# Patient Record
Sex: Male | Born: 1935 | Race: White | Hispanic: No | Smoking: Former smoker
Health system: Southern US, Community
[De-identification: ages and names within clinical notes are randomized; demographics above are authoritative.]

## PROBLEM LIST (undated history)

## (undated) DIAGNOSIS — E785 Hyperlipidemia, unspecified: Secondary | ICD-10-CM

## (undated) DIAGNOSIS — I639 Cerebral infarction, unspecified: Secondary | ICD-10-CM

## (undated) DIAGNOSIS — Z8739 Personal history of other diseases of the musculoskeletal system and connective tissue: Secondary | ICD-10-CM

## (undated) DIAGNOSIS — I10 Essential (primary) hypertension: Secondary | ICD-10-CM

## (undated) DIAGNOSIS — K219 Gastro-esophageal reflux disease without esophagitis: Secondary | ICD-10-CM

## (undated) DIAGNOSIS — I2510 Atherosclerotic heart disease of native coronary artery without angina pectoris: Secondary | ICD-10-CM

## (undated) DIAGNOSIS — N189 Chronic kidney disease, unspecified: Secondary | ICD-10-CM

## (undated) HISTORY — DX: Chronic kidney disease, unspecified: N18.9

## (undated) HISTORY — PX: CORONARY ARTERY BYPASS GRAFT: SHX141

## (undated) HISTORY — DX: Personal history of other diseases of the musculoskeletal system and connective tissue: Z87.39

## (undated) HISTORY — DX: Atherosclerotic heart disease of native coronary artery without angina pectoris: I25.10

## (undated) HISTORY — PX: HERNIA REPAIR: SHX51

## (undated) HISTORY — DX: Hyperlipidemia, unspecified: E78.5

## (undated) HISTORY — DX: Gastro-esophageal reflux disease without esophagitis: K21.9

## (undated) HISTORY — DX: Essential (primary) hypertension: I10

---

## 2006-11-05 ENCOUNTER — Inpatient Hospital Stay (HOSPITAL_BASED_OUTPATIENT_CLINIC_OR_DEPARTMENT_OTHER): Admission: RE | Admit: 2006-11-05 | Discharge: 2006-11-05 | Payer: Self-pay | Admitting: *Deleted

## 2006-11-10 ENCOUNTER — Encounter (INDEPENDENT_AMBULATORY_CARE_PROVIDER_SITE_OTHER): Payer: Self-pay | Admitting: *Deleted

## 2006-11-10 ENCOUNTER — Ambulatory Visit (HOSPITAL_COMMUNITY): Admission: RE | Admit: 2006-11-10 | Discharge: 2006-11-10 | Payer: Self-pay | Admitting: *Deleted

## 2006-12-11 ENCOUNTER — Encounter (INDEPENDENT_AMBULATORY_CARE_PROVIDER_SITE_OTHER): Payer: Self-pay | Admitting: *Deleted

## 2006-12-11 ENCOUNTER — Inpatient Hospital Stay (HOSPITAL_COMMUNITY): Admission: RE | Admit: 2006-12-11 | Discharge: 2006-12-16 | Payer: Self-pay | Admitting: Surgery

## 2006-12-26 ENCOUNTER — Emergency Department (HOSPITAL_COMMUNITY): Admission: EM | Admit: 2006-12-26 | Discharge: 2006-12-26 | Payer: Self-pay | Admitting: Emergency Medicine

## 2007-12-30 HISTORY — PX: CARDIAC VALVE REPLACEMENT: SHX585

## 2009-11-19 ENCOUNTER — Ambulatory Visit: Payer: Self-pay | Admitting: Diagnostic Radiology

## 2009-11-19 ENCOUNTER — Encounter: Payer: Self-pay | Admitting: Emergency Medicine

## 2009-11-19 ENCOUNTER — Inpatient Hospital Stay (HOSPITAL_COMMUNITY): Admission: EM | Admit: 2009-11-19 | Discharge: 2009-11-23 | Payer: Self-pay | Admitting: Internal Medicine

## 2011-01-19 ENCOUNTER — Encounter: Payer: Self-pay | Admitting: Surgery

## 2011-04-02 LAB — CBC
HCT: 25.4 % — ABNORMAL LOW (ref 39.0–52.0)
HCT: 27.6 % — ABNORMAL LOW (ref 39.0–52.0)
Hemoglobin: 9.3 g/dL — ABNORMAL LOW (ref 13.0–17.0)
MCHC: 34.8 g/dL (ref 30.0–36.0)
MCHC: 33.7 g/dL (ref 30.0–36.0)
MCHC: 34.5 g/dL (ref 30.0–36.0)
MCV: 97.7 fL (ref 78.0–100.0)
MCV: 97.6 fL (ref 78.0–100.0)
Platelets: 187 10*3/uL (ref 150–400)
Platelets: 207 10*3/uL (ref 150–400)
Platelets: 330 10*3/uL (ref 150–400)
RBC: 3.01 MIL/uL — ABNORMAL LOW (ref 4.22–5.81)
RBC: 3.75 MIL/uL — ABNORMAL LOW (ref 4.22–5.81)
RDW: 13.5 % (ref 11.5–15.5)
RDW: 13.9 % (ref 11.5–15.5)
RDW: 14.3 % (ref 11.5–15.5)
RDW: 14.9 % (ref 11.5–15.5)
RDW: 13.1 % (ref 11.5–15.5)
WBC: 19.3 10*3/uL — ABNORMAL HIGH (ref 4.0–10.5)
WBC: 26 10*3/uL — ABNORMAL HIGH (ref 4.0–10.5)

## 2011-04-02 LAB — BASIC METABOLIC PANEL
CO2: 25 mEq/L (ref 19–32)
CO2: 23 mEq/L (ref 19–32)
Calcium: 7.5 mg/dL — ABNORMAL LOW (ref 8.4–10.5)
Chloride: 107 mEq/L (ref 96–112)
GFR calc Af Amer: 57 mL/min — ABNORMAL LOW (ref >60)
Glucose, Bld: 115 mg/dL — ABNORMAL HIGH (ref 70–99)
Glucose, Bld: 117 mg/dL — ABNORMAL HIGH (ref 70–99)
Potassium: 3.5 mEq/L (ref 3.5–5.1)
Sodium: 141 mEq/L (ref 135–145)
Sodium: 141 mEq/L (ref 135–145)

## 2011-04-02 LAB — COMPREHENSIVE METABOLIC PANEL
ALT: 18 U/L (ref 0–53)
ALT: 22 U/L (ref 0–53)
AST: 18 U/L (ref 0–37)
AST: 44 U/L — ABNORMAL HIGH (ref 0–37)
Albumin: 2.1 g/dL — ABNORMAL LOW (ref 3.5–5.2)
Albumin: 2.4 g/dL — ABNORMAL LOW (ref 3.5–5.2)
Albumin: 4.3 g/dL (ref 3.5–5.2)
Alkaline Phosphatase: 37 U/L — ABNORMAL LOW (ref 39–117)
BUN: 25 mg/dL — ABNORMAL HIGH (ref 6–23)
CO2: 24 mEq/L (ref 19–32)
Calcium: 9.5 mg/dL (ref 8.4–10.5)
Chloride: 104 mEq/L (ref 96–112)
Creatinine, Ser: 1.97 mg/dL — ABNORMAL HIGH (ref 0.4–1.5)
Creatinine, Ser: 1.99 mg/dL — ABNORMAL HIGH (ref 0.4–1.5)
GFR calc Af Amer: 39 mL/min — ABNORMAL LOW (ref >60)
GFR calc Af Amer: 47 mL/min — ABNORMAL LOW (ref >60)
GFR calc non Af Amer: 32 mL/min — ABNORMAL LOW (ref >60)
Glucose, Bld: 114 mg/dL — ABNORMAL HIGH (ref 70–99)
Sodium: 137 mEq/L (ref 135–145)
Sodium: 147 mEq/L — ABNORMAL HIGH (ref 135–145)
Total Bilirubin: 0.5 mg/dL (ref 0.3–1.2)
Total Bilirubin: 0.7 mg/dL (ref 0.3–1.2)
Total Protein: 5.1 g/dL — ABNORMAL LOW (ref 6.0–8.3)
Total Protein: 7.5 g/dL (ref 6.0–8.3)

## 2011-04-02 LAB — IRON AND TIBC
Saturation Ratios: 10 % — ABNORMAL LOW (ref 20–55)
TIBC: 118 ug/dL — ABNORMAL LOW (ref 215–435)

## 2011-04-02 LAB — CEA: CEA: 1.9 ng/mL (ref 0.0–5.0)

## 2011-04-02 LAB — URINALYSIS, ROUTINE W REFLEX MICROSCOPIC
Bilirubin Urine: NEGATIVE
Glucose, UA: NEGATIVE mg/dL
Ketones, ur: NEGATIVE mg/dL
Specific Gravity, Urine: 1.014 (ref 1.005–1.030)
pH: 5.5 (ref 5.0–8.0)

## 2011-04-02 LAB — DIFFERENTIAL
Basophils Relative: 0 % (ref 0–1)
Eosinophils Relative: 1 % (ref 0–5)
Lymphs Abs: 2.1 10*3/uL (ref 0.7–4.0)
Monocytes Absolute: 1.9 10*3/uL — ABNORMAL HIGH (ref 0.1–1.0)
Monocytes Relative: 7 % (ref 3–12)

## 2011-04-02 LAB — LIPID PANEL: Total CHOL/HDL Ratio: 1.5 RATIO

## 2011-04-02 LAB — CULTURE, BLOOD (ROUTINE X 2): Culture: NO GROWTH

## 2011-04-02 LAB — MAGNESIUM: Magnesium: 1.6 mg/dL (ref 1.5–2.5)

## 2011-04-02 LAB — PHOSPHORUS: Phosphorus: 1.7 mg/dL — ABNORMAL LOW (ref 2.3–4.6)

## 2011-04-02 LAB — SODIUM, URINE, RANDOM: Sodium, Ur: 15 mEq/L

## 2011-04-02 LAB — CREATININE, URINE, RANDOM: Creatinine, Urine: 148.1 mg/dL

## 2011-04-02 LAB — PROTIME-INR: INR: 1.49 (ref 0.00–1.49)

## 2011-05-16 NOTE — H&P (Signed)
Adam Weeks, Adam Weeks                   ACCOUNT NO.:  [id number]   MEDICAL RECORD NO.:  [id number]           PATIENT TYPE:   LOCATION:                                 FACILITY:   PHYSICIAN:  Elmore Guise., M.D.DATE OF BIRTH:  1927/06/15   DATE OF ADMISSION:  DATE OF DISCHARGE:                                HISTORY & PHYSICAL   INDICATION FOR ADMISSION AND CATHETERIZATION:  Severe aortic stenosis, now  symptomatic.   HISTORY OF PRESENT ILLNESS:  The patient is very pleasant 77 year old white  male.   PAST MEDICAL HISTORY:  Hypertension, dyslipidemia, degenerative joint  disease, lumbar spinal stenosis, aortic valve disease who presents for a new  patient evaluation.  The patient has been in the [place] system for some time;  and he has had serial echocardiograms done there which have shown  progressive aortic stenosis.  His last echo was done April 16, 2006 which  showed severe aortic stenosis with aortic valve area of 0.9 centimeter  squared.  He had a peak and mean gradients of 84 and 49 mmHg.  He also had  moderate pulmonary hypertension with peak PA systolic pressure of 49 mmHg.  He had trace-to-mild aortic insufficiency.   At that time a long conversation was had with the [facility] and he was set  up for cardiac catheterization.  Unfortunately he has not had a heart  catheterization, as of this time (because of mild renal insufficiency).  During the summer he states that he could do all of his activities without  any significant limitations.  He did start having problems with lower back  pain; and was found to have degenerative joint disease with spinal stenosis.  The degree of spinal stenosis is unknown at time of dictation.  He did have  workup including MRI which showed a foraminal narrowing; and he was set up  to see a neurosurgeon for further evaluation.  However this was also placed  on hold until the patient had his heart workup completed.  He states when he  takes his  pain medicines he can ambulate; however, over the last month or  two he has been having significant dyspnea.  He now has trouble with  shortness of breath on climbing stairs, denies any orthopnea, no PND; does  have an occasional lower extremity edema right greater than left.  No  significant angina or chest pain.  He did have three presyncopal episodes in  the last 3-4 months.  His first episode happened when he got up too  quickly.  However, his other spells happened while he was up and  ambulatory.  He felt weak and faint.  He denied any palpitations  associated with this.  His weight has been stable.  He has had no recent  fever, chills, nausea, vomiting, nor diarrhea.   REVIEW OF SYSTEMS:  As per HPI or otherwise negative.   CURRENT MEDICATIONS:  1. Alendronate 70 mg once a week.  2. Amlodipine 10 mg daily.  3. Atenolol 50 mg daily.  4. Calcium with vitamin D twice daily.  5. Flexeril 10 mg t.i.d.  6. Vicodin p.r.n.  7. Finasteride 5 mg q.h.s.  8. Folic acid 1 mg daily.  9. Lasix 40 mg daily.  10.Lactulose p.r.n.  11.Loratadine 10 mg daily.  12.Multivitamin once daily.  13  Omeprazole 20 mg daily.  1. Oxycodone 5 mg p.r.n.  2. Paxil 20 mg daily.  3. Fish oil tablets 2 twice daily.  4. Zocor 40 mg daily.   ALLERGIES:  None.   FAMILY HISTORY:  Positive for heart disease in his father, starting in his  early-to-mid 41s; he died from a heart attack at age 16.  His mother died  from breast cancer.   SOCIAL HISTORY:  He is divorced.  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  He walked until late spring early summer; and has had  decreased exertional capacity secondary to shortness of breath and back  issues since that time.  He smoked for 20 pack years, quit 40 years ago.  No  alcohol.  Does drink one caffeinated beverage daily.  Past surgical history  includes 3 hernia repairs in the past, tonsillectomy, and a kidney stone  removal.   PHYSICAL EXAMINATION:   VITAL SIGNS:  His weight is 182 pounds.  His blood  pressure 120/78, heart rate 63 and regular.  GENERAL:  He is a very pleasant elderly white male alert and oriented x4, no  acute distress.  NECK:  He has no JVD.  He has bilateral radiation of his AS murmur to both  carotids, right greater than left.  LUNGS:  Clear.  HEART:  Regular with a normal S1-S2 with 3/6 late peaking systolic ejection  murmur noted.  ABDOMEN:  Soft, nontender, nondistended.  No rebound or guarding.  No  abdominal bruits.  EXTREMITIES:  Warm with 2+ pulses and no significant edema.  His femoral  pulse is 2+.   His echo results were reviewed; and he has had progression of his aortic  stenosis with his echo from [place] showing aortic valve area of 1.1  centimeter squared; an echo 1 year later showed an aortic valve area of 0.9  centimeters squared.  His peak and mean gradients have also increased from  72 and 41 to 84 and 49.  His most recent blood work done September 01, 2006  showed a BUN and creatinine of 29 and 1.6, normal electrolytes.  Estimated  GFR of 45 mL per minute.  He has had normal LFTs.  His total cholesterol of  129, HDL of 32, triglycerides of 166 and the LDL of 64.  His EKG, today,  shows normal sinus rhythm rate of 63 per minute normal axis with right  bundle branch block.  No significant ST or T wave changes.  His chest x-ray,  today, shows no acute cardiopulmonary disease with calcification noted at  his aortic root.   IMPRESSION:  1. Severe symptomatic aortic stenosis.  2. History of hypertension.  3. History of chronic renal insufficiency.  4. History of dyslipidemia.  5. History of spinal stenosis.   PLAN:  1. I did discuss his symptoms with him at length.  Since he has      progression of his symptoms as well as progression of his disease by      echocardiogram, I do recommend cardiac catheterization to evaluate for     any significant coronary disease as well as a full evaluation  of his      aortic valve.  I did discuss  should we have difficulty and/or are able      to cross the aortic valve, I would then recommend a TEE for further      evaluation and planimetry.  However, if I can get across the valve I      would hold off on a TEE at this time.  Because of his mild      chronic renal insufficiency, He will be set up with [facility] the day of      and day prior to his procedure.  I have also asked him to stop his      Lasix, the day prior to his procedure.  He will continue his medicines      as listed, otherwise add an aspirin once daily.  All of his questions      were answered.      Elmore Guise., M.D.  Electronically Signed     TWK/MEDQ  D:  11/03/2006  T:  11/04/2006  Job:  [id number]

## 2011-05-16 NOTE — Cardiovascular Report (Signed)
NAMEALIOU, MEALEY                   ACCOUNT NO.:  [id number]   MEDICAL RECORD NO.:  [id number]          PATIENT TYPE:  OIB   LOCATION:  6501                         FACILITY:  [facility]   PHYSICIAN:  Elmore Guise., M.D.DATE OF BIRTH:  Mar 04, 1927   DATE OF PROCEDURE:  11/05/2006  DATE OF DISCHARGE:                              CARDIAC CATHETERIZATION   INDICATION FOR PROCEDURE:  Symptomatic severe aortic stenosis   PROCEDURE DESCRIPTION:  The patient was brought to the cardiac cath lab  after appropriate informed consent.  He was prepped and draped in sterile  fashion.  Approximately 20 cc of 1% lidocaine was used for local anesthesia.  A 5-French sheath was placed in the right femoral artery, and a 7-French  sheath was placed in the right femoral vein without difficulty.  Right heart  catheterization was then performed, followed by several attempts to cross  the aortic valve which were unsuccessful.  We then continued with coronary  angiography.  The patient tolerated the procedure well, with no apparent  complications.   FINDINGS:  Right heart cath numbers:  1. RA:  6 mmHg.  2. RV:  23/6 mmHg.  3. PA:  27/11 mmHg.  4. PCWP:  12 mmHg.  5. AO:  111/50 mmHg.  6. CO: 4.6 L/min by thermodilution   AORTIC VALVE:  Moderate calcification. Multiple attempts were made to cross  the aortic valve which were unsuccessful.   CORONARIES:  1. Left main:  Normal.  2. LAD:  Mild luminal irregularities.  3. D1/D2:  Small to moderate-sized vessels, with mild luminal      irregularities.  4. Ramus intermedius:  Moderate size, with mild luminal irregularities.  5. LCX:  Is nondominant, with mild luminal irregularities.  6. OM1/OM2:  Mild luminal irregularities.  7. RCA:  Dominant, with proximal 50-60% stenosis, with diffuse mid- and      distal luminal irregularities.  8. PDA/PLV:  No obstructive disease.   IMPRESSION:  1. Moderate proximal RCA disease,with 50-60% proximal stenosis.  2.  Nonobstructive left system as described.  3. History of severe symptomatic aortic stenosis.  4. Normal right heart pressures.   PLAN:  The patient will continue his current medical therapy.  We will  schedule him for a TEE for better evaluation of his aortic valve disease.  He should continue with SBE prophylaxis with any dental, GI, or GU  procedure. Surgical referral after complete evaluation.      Elmore Guise., M.D.  Electronically Signed     TWK/MEDQ  D:  11/05/2006  T:  11/06/2006  Job:  Elmo Putt

## 2011-05-16 NOTE — Discharge Summary (Signed)
NAMEJABAR, KRYSIAK                   ACCOUNT NO.:  [id number]   MEDICAL RECORD NO.:  [id number]          PATIENT TYPE:  INP   LOCATION:  2016                         FACILITY:  [facility]   PHYSICIAN:  Evelene Croon, M.D.     DATE OF BIRTH:  28-Dec-1927   DATE OF ADMISSION:  12/11/2006  DATE OF DISCHARGE:                               DISCHARGE SUMMARY   PRIMARY DIAGNOSES:  1. Severe aortic stenosis.  2. Single-vessel coronary artery disease.   IN-HOSPITAL DIAGNOSES:  1. Postoperative atrial fibrillation.  2. Volume overload.  3. Acute blood loss anemia, postoperatively.   SECONDARY DIAGNOSES:  1. History of lower back degenerative joint disease.  2. Status post kidney stone surgery in 1967, with __________  .  3. Status post three abdominal wall hernias repaired.  4. Status post tonsillectomy.  5. Status post cataract surgery.  6. Hypertension.   IN-HOSPITAL OPERATIONS AND PROCEDURES:  1. Coronary artery bypass grafting x1 using a saphenous vein graft to      the posterior descending branch of the right coronary artery.  2. Aortic valve replacement using a 25-mm St. Jude Biocor porcine      valve.  3. Endoscopic vein harvesting from the left leg.   The patient's history and physical and hospital course:  The patient is  a 77 year old, relatively healthy, gentleman who was fairly disabled  secondary to degenerative joint disease of his lower spine with spinal  stenosis causing pain in both legs.  He has been followed at the [facility].  He has a history of aortic stenosis, has been followed by  the [facility], and was told that he should have surgery for this.  The  patient desired to follow up at [facility].  He was seen by Dr. Reyes Ivan  and underwent catheterization on November 05, 2006, which showed single-  vessel disease with 60% proximal right coronary stenosis.  He had severe  aortic stenosis __________  across the aortic valve.  The aortic valve  area was 0.8-cm2 by  plane imagery and 0.59-cm2 by __________  .  The  peak transaortic valve gradient was 51 and the mean gradient 27.7.  __________  calcified.  PA pressures was 27 __________  wedge pressure  12 and right atrial pressure of 6.  There is mild to moderate tricuspid  regurgitation, mild mitral regurgitation noted at TEE.   Following test results,  the patient was evaluated by Dr. Laneta Simmers.  Dr.  Laneta Simmers discussed with the patient undergoing coronary artery bypass  grafting as well as aortic valve replacement.  He discussed risks and  benefits of the procedures with the patient.  The patient acknowledges  understanding and agreed to proceed.  Surgery was scheduled for December 11, 2006.  For details of the patient's past medical history and  physical exam, please see dictated history and physical.   The patient was taken to the operating room on December 11, 2006, where  he underwent coronary artery bypass grafting x1, using a saphenous vein  graft to the posterior descending branch of the right  coronary artery.  The patient also had aortic valve replacement using a 25-mm St. Jude  Biocor porcine valve.  Endoscopic vein harvesting was done from the left  leg.  The patient tolerated the procedures well and was transferred to  the intensive care unit in stable condition.  Following surgery, the patient was seen to be hemodynamically stable.  The patient was extubated the evening of surgery.  Following extubation,  he was noted to be alert and oriented x4.  Neuro intact.  Post-op #1,  the patient's vital signs were stable.  Postoperative chest x-ray  stable.  Chest tubes and lines were discontinued.  The patient was noted  to have low blood count 9.3 and 27 and this was monitored.  The patient  was noted to be in a normal sinus rhythm post-op day #1.  On post-op day #3, the patient was noted to have gone into atrial  fibrillation.  He was started on IV amiodarone and did convert back to a  normal  sinus rhythm.  The patient was continued on amiodarone and  atenolol.  He remained in a normal sinus rhythm.  Due to the patient  undergoing a Biocor porcine valve, no Coumadin was needed.  With the  patient remaining in a normal sinus rhythm, no Coumadin was needed.  The  patient was transferred out to 2000 on post-op day #3.  His hemoglobin/hematocrit did drop slightly to 8.3 and 24 but he was  asymptomatic.  This was again monitored closely.  The patient was  ambulating well postoperatively.  His incisions were clean, dry and  intact and healing well.  He was able to be weaned off oxygen sating  greater than 90% on room air.  The patient was tolerating a regular diet  well with no nausea or vomiting noted.  He did have a slightly elevated  creatinine postoperatively, but his baseline was 1.8.  His postoperative  creatinine was noted to be 1.6.  Last lab obtained on post-op day #4, showed a white count of 7.7,  hemoglobin of 8.5, hematocrit 24.8, platelet count 126.  Sodium of 141,  potassium 4, chloride of 109, bicarb of 27, BUN of 25, creatinine of  1.6, glucose of 119.  __________  the patient was noted to be in a normal sinus rhythm.  His  incisions were clean, dry and intact and healing well.  The patient  considered ready for discharge home in the a.m. on post-op day #5,  December 16, 2006.   FOLLOWUP APPOINTMENTS:  1. A followup appointment has been arranged with Dr. Laneta Simmers for      January 05, 2007 at 1:45 p.m.  2. The patient will need to follow up with Dr. Kerby Nora in 2 weeks.  The      patient will need to contact Dr. Davy Pique office to schedule      followup appointment with him.   ACTIVITY:  1. The patient was instructed no driving till released to do so.  2. No lifting over 10 pounds.  3. Told to ambulate 3-4 times per day, progress as tolerated.  4. Continue breathing exercises.  INCISIONAL CARE:  The patient is told to shower, washing his incisions  using soap and  water.   He is to contact the office if he develops any drainage or opening from  any of his incision sites.   DIET:  The patient was educated on diet to be low-fat, low-salt.   DISCHARGE MEDICATIONS:  1. Multivitamin daily.  2. Calcium with D daily.  3. Flexeril 10 mg at night p.r.n..  4. Aspirin 325 mg daily.  5. Atenolol 25 mg daily.  6. __________  70 mg on Saturday.  7. Lipitor 20 mg at night.  8. Claritin 10 mg p.r.n.  9. Omeprazole 20 mg daily.  10.Proscar 5 mg daily.  11.Paxil  20 mg daily.  12.Lasix 40 mg daily.  13.Potassium chloride 20 mEq daily.  14.Amiodarone 200 mg b.i.d.  15.Folic acid 1 mg daily.  16.Oxycodone 5 mg one to two tabs every four to six hours p.r.n. pain.      Theda Belfast, [place]      Evelene Croon, M.D.  Electronically Signed    KMD/MEDQ  D:  12/15/2006  T:  12/15/2006  Job:  [id number]   cc:   Kerby Nora, Dr.

## 2011-05-16 NOTE — Op Note (Signed)
Adam Weeks, Adam Weeks                   ACCOUNT NO.:  [id number]   MEDICAL RECORD NO.:  [id number]          PATIENT TYPE:  INP   LOCATION:  2309                         FACILITY:  [facility]   PHYSICIAN:  Evelene Croon, M.D.     DATE OF BIRTH:  11-26-1927   DATE OF PROCEDURE:  12/11/2006  DATE OF DISCHARGE:                               OPERATIVE REPORT   PREOPERATIVE DIAGNOSES:  Severe aortic stenosis, and single-vessel  coronary artery disease.   POSTOPERATIVE DIAGNOSES:  Severe aortic stenosis, and single-vessel  coronary artery disease.   OPERATIVE PROCEDURES:  Median sternotomy, extracorporeal circulation,  coronary artery bypass surgery times one using a saphenous vein graft to  the posterior descending branch of the right coronary artery, aortic  valve replacement using a 25-mm St. Jude Biocor porcine valve,  endoscopic vein harvesting from the left leg.   ATTENDING SURGEON:  Evelene Croon, MD.   ASSISTANTS:  1. Sheliah Plane, MD.  2. Rowe Clack, P.A.-C.   ANESTHESIA:  General endotracheal.   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  He has  been followed at the [facility] and has been told that he will  require subsequently for that.  He has a history of aortic stenosis that  has been followed by the [facility], and he was told that he should have  surgery for that.  He desired to be followed up in [place] and have  surgery performed here.  He was seen by Dr. Reyes Ivan and underwent  catheterization on 11/05/2006, which showed single-vessel coronary  disease with a 60% proximal right coronary stenosis.  He had severe  aortic stenosis and it was not possible to cross his aortic valve.  The  aortic valve area was 0.8 cm sq by plain imagery, and 0.59 cm sq by V  max.  The peak transaortic valve gradient  was 51, and the mean gradient  27.7.  The valve was markedly calcified.  PA pressure was 27/11, with a  wedge pressure of 12 and a right atrial pressure of 6.  There was mild-  to-moderate tricuspid regurgitation and mild mitral regurgitation noted  on TEE.  After review of the angiogram and echocardiogram and  examination of the patient, it was felt that aortic valve replacement  and coronary artery bypass to the right coronary artery were the best  treatments to prevent further complications of aortic stenosis and to  prevent left ventricular deterioration.  I discussed the operative  procedure of aortic valve replacement and coronary artery bypass to the  right coronary artery with the patient and his son.  We discussed  alternatives, benefits and risks, including bleeding, blood transfusion,  infection, stroke, myocardial infarction, graft failure, and death.  We  also discussed the aortic valve replacement prostheses, and I  recommended a tissue valve, given his age of 77.  He was in agreement  with that.   DESCRIPTION OF OPERATIVE  PROCEDURES:  The patient was taken to the  operating room and placed on the table in supine position.  After  induction of general endotracheal anesthesia, a Foley catheter was  placed in the bladder using sterile technique.  Preoperative intravenous  antibiotics were given.  Then, the chest, abdomen and both lower  extremities were prepped and draped in the usual sterile manner.  A  transesophageal echocardiogram was performed and showed severe calcific  aortic stenosis.  There was so much calcium in the valve, it was not  possible to see the opening.  Left ventricular function appeared well  preserved.  Right ventricular function was well preserved.  There was  trivial mitral regurgitation.   Then, the chest was opened through a median sternotomy incision and the  pericardium opened in the midline.  Examination of the heart showed good  ventricular  contractility.  The ascending aorta was fairly long.  There  was calcified plaque present posteriorly, but the anterior wall of the  aorta appeared soft.  This posterior plaque extended up to about 2 cm  proximal to the innominate artery takeoff.  The aorta in this area was  soft.   Then, the saphenous vein was harvested.  We initially examined the  saphenous vein adjacent to the right knee, where it was a tiny vessel.  Therefore, we examined the saphenous vein adjacent to the left knee, and  this vein was medium-size and good quality.  Then, this vein was  harvested using endoscopic vein harvest technique from the left thigh.   Then, the patient was heparinized, and when an adequate activated  clotting time was achieved, the distal ascending aorta was cannulated  using a 20-French aortic cannula for arterial inflow.  Venous outflow  was achieved using a 2-stage venous cannula from the right atrial  appendage.  An antegrade cardioplegia cannula was inserted in the aortic  root.  A left ventricular vent was placed in the right superior  pulmonary vein.  I attempted to place a retrograde cardioplegia cannula,  but could not get it to engage the coronary sinus.  Therefore, we  decided to only give antegrade cardioplegia directly into the coronary  ostia once the aorta was opened.   Then, the patient was placed on cardiopulmonary bypass and the distal  coronary was identified.  The right coronary artery was diffusely  diseased with calcific plaque.  There was a moderate-size posterior  descending and posterolateral branch.  The posterior descending branch  appeared free of disease and was chosen for grafting on the right side.  The LAD and the circumflex branches has minimal distal plaque present.   Then, the aorta was crossclamped and 1000 cc of cold-blood antegrade  cardioplegia was administered in the aortic root with quick arrest of the heart.  Systemic hypothermia to 28 degrees  Centigrade and topical  hypothermic ice saline were used.  A temperature probe was placed in the  septum, and an insulated pad on the pericardium.   The first distal anastomosis was performed to the posterior descending  coronary artery.  The internal mammary was 1.6 mm.  The conduit used was  a segment of greater saphenous vein, and the anastomosis performed in an  end-to-side manner using continuous 7-0 Prolene suture.  Flow noted to  the graft was excellent.  Then, another dose of cardioplegia was given.   Then, attention was turned to aortic valve replacement.  The aorta was  opened transversely at the level of the sinotubular  junction.  Examination of the native valve showed that there were 3 leaflets that  were very heavily calcified and immobile.  The right and left coronary  ostia were identified and were sitting a good distance away from the  annulus.  There was no disease around the coronary ostia.  Then, the  native valve was excised.  The annulus was decalcified with rongeurs.  Care was taken to remove all particulate debris.  The left ventricle and  aortic root were irrigated with ice saline solution.  Then, the annulus  was sized and a 25-mm St. Jude Biocor porcine valve was chosen.  This  was model #B10-25A-00, serial [id number].  Then, a series of pledgeted 2-  0 Ethibond horizontal mattress sutures was placed around the aortic  annulus with the pledgets in a subannular position.  The sutures were  placed through the sewing ring and the valve lowered into place.  The  sutures were tied sequentially.  The valve seated nicely.  The coronary  ostia were not obstructed.  The patient was then rewarmed to 37 degrees  Centigrade.  The aorta was closed in two layers using continuous 4-0  Prolene suture.  Then, the single proximal vein graft anastomosis was  formed to the aortic root in end-to-side manner using continuous 6-0  Prolene suture.  Then, the left side of the heart was  de-aired.  The  head was placed in the Trendelenburg position and the crossclamp removed  at a time of 89 minutes.  There was spontaneous return of ventricular  fibrillation, and the patient was defibrillated and in sinus rhythm.   The proximal and distal anastomoses appeared hemostatic and the lie of  the graft was satisfactory.  A graft marker was placed around the  proximal anastomosis.  Two temporary right ventricular and right atrial  pacing wires were placed and brought out through the skin.   When the patient had rewarmed to 37 degrees Centigrade, he was weaned  from cardiopulmonary bypass on renal-dose dopamine.  Total bypass time  was 114 minutes.  Cardiac function appeared excellent, with a cardiac  output of 6 L per minute.  Transesophageal echocardiogram was performed  and showed a normal functioning aortic valve prosthesis.  There was no  evidence of perivalvular leak or regurgitation.  There was no mitral regurgitation.  Left ventricular function appeared well preserved.  Protamine was then given, and the venous and aortic cannulae were  removed without difficulty.  Hemostasis was achieved.  Two chest tubes  were placed with a tube in the posterior pericardium and 1 in the  anterior mediastinum.  The pericardium was loosely reapproximated over  the heart.  The sternum was closed with #6 stainless steel wires.  The  fascia was closed with continuous #1 Vicryl suture.  The subcutaneous  tissue was closed with continuous 2-0 Vicryl, and the skin with a 3-0  Vicryl subcuticular closure.  The lower extremity vein harvest sites  were closed in layers in a similar manner.  The sponge, needle and  instrument counts were correct as reported by the scrub nurse.  Dry  sterile dressings were applied over the incisions, around the chest  tubes, which were hooked to Pleur-evac suction.  The patient remained  hemodynamically stable and was transported to the SICU in guarded, but  stable  condition.      Evelene Croon, M.D.  Electronically Signed     BB/MEDQ  D:  12/11/2006  T:  12/12/2006  Job:  [id number]  cc:   Sheliah Plane, MD  Elmore Guise., M.D.  Cardiac Cath Lab

## 2011-08-15 ENCOUNTER — Ambulatory Visit: Payer: Self-pay | Admitting: Internal Medicine

## 2011-09-21 ENCOUNTER — Encounter: Payer: Self-pay | Admitting: Internal Medicine

## 2011-09-21 DIAGNOSIS — Z0000 Encounter for general adult medical examination without abnormal findings: Secondary | ICD-10-CM | POA: Insufficient documentation

## 2011-09-23 ENCOUNTER — Encounter: Payer: Self-pay | Admitting: Internal Medicine

## 2011-09-23 ENCOUNTER — Ambulatory Visit (INDEPENDENT_AMBULATORY_CARE_PROVIDER_SITE_OTHER): Payer: Medicare Other | Admitting: Internal Medicine

## 2011-09-23 ENCOUNTER — Ambulatory Visit: Payer: Medicare Other | Admitting: Internal Medicine

## 2011-09-23 DIAGNOSIS — M8580 Other specified disorders of bone density and structure, unspecified site: Secondary | ICD-10-CM | POA: Insufficient documentation

## 2011-09-23 DIAGNOSIS — M949 Disorder of cartilage, unspecified: Secondary | ICD-10-CM

## 2011-09-23 DIAGNOSIS — M899 Disorder of bone, unspecified: Secondary | ICD-10-CM

## 2011-09-23 DIAGNOSIS — Z954 Presence of other heart-valve replacement: Secondary | ICD-10-CM

## 2011-09-23 DIAGNOSIS — M545 Low back pain, unspecified: Secondary | ICD-10-CM | POA: Insufficient documentation

## 2011-09-23 DIAGNOSIS — E785 Hyperlipidemia, unspecified: Secondary | ICD-10-CM

## 2011-09-23 DIAGNOSIS — I2510 Atherosclerotic heart disease of native coronary artery without angina pectoris: Secondary | ICD-10-CM | POA: Insufficient documentation

## 2011-09-23 DIAGNOSIS — Z952 Presence of prosthetic heart valve: Secondary | ICD-10-CM | POA: Insufficient documentation

## 2011-09-23 DIAGNOSIS — I10 Essential (primary) hypertension: Secondary | ICD-10-CM

## 2011-09-23 NOTE — Assessment & Plan Note (Signed)
Continue with current prescription therapy as reflected on the Med list. Work on ROM

## 2011-09-23 NOTE — Assessment & Plan Note (Signed)
S/p pig valve replacement w/1 graft at [facility] 2009. No need for coumadin

## 2011-09-23 NOTE — Progress Notes (Signed)
  Subjective:    Patient ID: Adam Weeks, male    DOB: May 31, 1927, 77 y.o.   MRN: [id number]  HPI  New pt  The patient presents for a follow-up of  chronic hypertension, chronic dyslipidemia, type 2 diabetes, OA, LBP,osteopenia controlled with medicines. F/u AVR 2007 and CAD (no MI). C/o severe LBP - on meds. He wants to cont. His care at [facility]    Review of Systems  Constitutional: Negative for chills, appetite change, fatigue and unexpected weight change.  HENT: Negative for nosebleeds, congestion, sore throat, sneezing, trouble swallowing and neck pain.   Eyes: Negative for itching and visual disturbance.  Respiratory: Negative for cough.   Cardiovascular: Negative for chest pain, palpitations and leg swelling.  Gastrointestinal: Negative for nausea, diarrhea, Weeks in stool and abdominal distention.  Genitourinary: Negative for frequency and hematuria.  Musculoskeletal: Positive for back pain. Negative for joint swelling and gait problem.  Skin: Negative for rash.  Neurological: Negative for dizziness, tremors, speech difficulty and weakness.  Psychiatric/Behavioral: Negative for sleep disturbance, dysphoric mood and agitation. The patient is not nervous/anxious.        Objective:   Physical Exam  Constitutional: He is oriented to person, place, and time. He appears well-developed.  HENT:  Mouth/Throat: Oropharynx is clear and moist.  Eyes: Conjunctivae are normal. Pupils are equal, round, and reactive to light.  Neck: Normal range of motion. No JVD present. No thyromegaly present.  Cardiovascular: Normal rate, regular rhythm, normal heart sounds and intact distal pulses.  Exam reveals no gallop and no friction rub.   No murmur heard. Pulmonary/Chest: Effort normal and breath sounds normal. No respiratory distress. He has no wheezes. He has no rales. He exhibits no tenderness.  Abdominal: Soft. Bowel sounds are normal. He exhibits no distension and no mass. There is no  tenderness. There is no rebound and no guarding.  Musculoskeletal: Normal range of motion. He exhibits tenderness (LS is tender). He exhibits no edema.  Lymphadenopathy:    He has no cervical adenopathy.  Neurological: He is alert and oriented to person, place, and time. He has normal reflexes. No cranial nerve deficit. He exhibits normal muscle tone. Coordination normal.  Skin: Skin is warm and dry. No rash noted.  Psychiatric: He has a normal mood and affect. His behavior is normal. Judgment and thought content normal.          Assessment & Plan:   Will get records from [facility], [address]

## 2011-09-23 NOTE — Patient Instructions (Signed)
Start chair yoga or Silver sneakers

## 2011-09-23 NOTE — Assessment & Plan Note (Signed)
Continue with current prescription therapy as reflected on the Med list.  

## 2012-01-20 ENCOUNTER — Encounter: Payer: Medicare Other | Admitting: Internal Medicine

## 2012-02-17 ENCOUNTER — Encounter: Payer: Self-pay | Admitting: Internal Medicine

## 2012-02-17 ENCOUNTER — Ambulatory Visit (INDEPENDENT_AMBULATORY_CARE_PROVIDER_SITE_OTHER): Payer: Medicare Other | Admitting: Internal Medicine

## 2012-02-17 VITALS — BP 120/70 | HR 77 | Temp 98.4°F | Resp 14 | Ht 71.0 in | Wt 177.0 lb

## 2012-02-17 DIAGNOSIS — M545 Low back pain: Secondary | ICD-10-CM

## 2012-02-17 DIAGNOSIS — M8580 Other specified disorders of bone density and structure, unspecified site: Secondary | ICD-10-CM

## 2012-02-17 DIAGNOSIS — I2510 Atherosclerotic heart disease of native coronary artery without angina pectoris: Secondary | ICD-10-CM

## 2012-02-17 DIAGNOSIS — Z136 Encounter for screening for cardiovascular disorders: Secondary | ICD-10-CM

## 2012-02-17 DIAGNOSIS — I10 Essential (primary) hypertension: Secondary | ICD-10-CM

## 2012-02-17 DIAGNOSIS — E785 Hyperlipidemia, unspecified: Secondary | ICD-10-CM

## 2012-02-17 DIAGNOSIS — Z0000 Encounter for general adult medical examination without abnormal findings: Secondary | ICD-10-CM

## 2012-02-17 DIAGNOSIS — N320 Bladder-neck obstruction: Secondary | ICD-10-CM

## 2012-02-17 MED ORDER — PREDNISOLONE 5 MG PO TABS: 5.0000 mg | ORAL_TABLET | Freq: Every day | ORAL | Status: DC

## 2012-02-17 NOTE — Assessment & Plan Note (Signed)
Continue with current prescription therapy as reflected on the Med list.  

## 2012-02-17 NOTE — Progress Notes (Signed)
  Subjective:    Patient ID: Adam Weeks, male    DOB: 10-Jun-1927, 78 y.o.   MRN: [id number]  HPI  The patient is here for a wellness exam. The patient has been doing well overall without major physical or psychological issues going on lately. The patient needs to address  chronic hypertension that has been well controlled with medicines; to address chronic  hyperlipidemia controlled with medicines as well; CAD, controlled with medical treatment and diet. C/o severe LBP all the time  Review of Systems  Constitutional: Negative for appetite change, fatigue and unexpected weight change.  HENT: Negative for nosebleeds, congestion, sore throat, sneezing, trouble swallowing and neck pain.   Eyes: Negative for itching and visual disturbance.  Respiratory: Negative for cough.   Cardiovascular: Negative for chest pain, palpitations and leg swelling.  Gastrointestinal: Negative for nausea, diarrhea, Weeks in stool and abdominal distention.  Genitourinary: Negative for frequency and hematuria.  Musculoskeletal: Positive for myalgias, back pain, arthralgias and gait problem. Negative for joint swelling.  Skin: Negative for rash.  Neurological: Negative for dizziness, tremors, speech difficulty and weakness.  Psychiatric/Behavioral: Negative for suicidal ideas, confusion, sleep disturbance, dysphoric mood and agitation. The patient is not nervous/anxious.        Objective:   Physical Exam  Constitutional: He is oriented to person, place, and time. He appears well-developed and well-nourished. No distress.  HENT:  Head: Normocephalic and atraumatic.  Right Ear: External ear normal.  Left Ear: External ear normal.  Nose: Nose normal.  Mouth/Throat: Oropharynx is clear and moist. No oropharyngeal exudate.  Eyes: Conjunctivae and EOM are normal. Pupils are equal, round, and reactive to light. Right eye exhibits no discharge. Left eye exhibits no discharge. No scleral icterus.  Neck: Normal range of  motion. Neck supple. No JVD present. No tracheal deviation present. No thyromegaly present.  Cardiovascular: Normal rate, regular rhythm, normal heart sounds and intact distal pulses.  Exam reveals no gallop and no friction rub.   No murmur heard. Pulmonary/Chest: Effort normal and breath sounds normal. No stridor. No respiratory distress. He has no wheezes. He has no rales. He exhibits no tenderness.  Abdominal: Soft. Bowel sounds are normal. He exhibits no distension and no mass. There is no tenderness. There is no rebound and no guarding.  Genitourinary: Rectum normal, prostate normal and penis normal. Guaiac negative stool. No penile tenderness.  Musculoskeletal: Normal range of motion. He exhibits tenderness (LS spine). He exhibits no edema.  Lymphadenopathy:    He has no cervical adenopathy.  Neurological: He is alert and oriented to person, place, and time. He has normal reflexes. No cranial nerve deficit. He exhibits normal muscle tone. Coordination normal.  Skin: Skin is warm and dry. No rash noted. He is not diaphoretic. No erythema. No pallor.  Psychiatric: He has a normal mood and affect. His behavior is normal. Judgment and thought content normal.          Assessment & Plan:

## 2012-02-17 NOTE — Patient Instructions (Signed)
Stop Fosamax Can hold Crestor x 2-3 wks to see if better

## 2012-02-17 NOTE — Assessment & Plan Note (Signed)
Vit D 

## 2012-02-17 NOTE — Assessment & Plan Note (Signed)
Stop Fosamax Can hold Crestor x 2-3 wks to see if better 

## 2012-02-17 NOTE — Assessment & Plan Note (Signed)
The patient is here for annual Medicare wellness examination and management of other chronic and acute problems.   The risk factors are reflected in the social history.  The roster of all physicians providing medical care to patient - is listed in the Snapshot section of the chart.  Activities of daily living:  The patient is 100% inedpendent in all ADLs: dressing, toileting, feeding as well as independent mobility  Home safety : The patient has smoke detectors in the home. They wear seatbelts.No firearms at home ( firearms are present in the home, kept in a safe fashion). There is no violence in the home.   There is no risks for hepatitis, STDs or HIV. There is no   history of blood transfusion. They have no travel history to infectious disease endemic areas of the world.  The patient has (has not) seen their dentist in the last six month. They have (not) seen their eye doctor in the last year. They deny (admit to) any hearing difficulty and have not had audiologic testing in the last year.  They do not  have excessive sun exposure. Discussed the need for sun protection: hats, long sleeves and use of sunscreen if there is significant sun exposure.   Diet: the importance of a healthy diet is discussed. They do have a healthy (unhealthy-high fat/fast food) diet.  The patient has a regular exercise program: walking 3_per week.  The benefits of regular aerobic exercise were discussed.  Depression screen: there are no signs or vegative symptoms of depression- irritability, change in appetite, anhedonia, sadness/tearfullness.  Cognitive assessment: the patient manages all their financial and personal affairs and is actively engaged. They could relate day,date,year and events; recalled 3/3 objects at 3 minutes; performed clock-face test normally.  The following portions of the patient's history were reviewed and updated as appropriate: allergies, current medications, past family history, past medical  history,  past surgical history, past social history  and problem list.  Vision, hearing, body mass index were assessed and reviewed.   During the course of the visit the patient was educated and counseled about appropriate screening and preventive services including : fall prevention , diabetes screening, nutrition counseling, colorectal cancer screening, and recommended immunizations.

## 2012-02-18 ENCOUNTER — Ambulatory Visit (INDEPENDENT_AMBULATORY_CARE_PROVIDER_SITE_OTHER): Payer: Medicare Other

## 2012-02-18 DIAGNOSIS — N320 Bladder-neck obstruction: Secondary | ICD-10-CM

## 2012-02-18 DIAGNOSIS — I10 Essential (primary) hypertension: Secondary | ICD-10-CM

## 2012-02-18 DIAGNOSIS — E785 Hyperlipidemia, unspecified: Secondary | ICD-10-CM

## 2012-02-18 DIAGNOSIS — Z136 Encounter for screening for cardiovascular disorders: Secondary | ICD-10-CM

## 2012-02-18 DIAGNOSIS — M8580 Other specified disorders of bone density and structure, unspecified site: Secondary | ICD-10-CM

## 2012-02-18 DIAGNOSIS — Z0000 Encounter for general adult medical examination without abnormal findings: Secondary | ICD-10-CM

## 2012-02-18 DIAGNOSIS — I2510 Atherosclerotic heart disease of native coronary artery without angina pectoris: Secondary | ICD-10-CM

## 2012-02-18 DIAGNOSIS — Z125 Encounter for screening for malignant neoplasm of prostate: Secondary | ICD-10-CM

## 2012-02-18 DIAGNOSIS — M545 Low back pain: Secondary | ICD-10-CM

## 2012-02-18 LAB — URINALYSIS
Bilirubin Urine: NEGATIVE
Ketones, ur: NEGATIVE
Leukocytes, UA: NEGATIVE
pH: 5 (ref 5.0–8.0)

## 2012-02-18 LAB — CBC WITH DIFFERENTIAL/PLATELET
Basophils Absolute: 0.1 10*3/uL (ref 0.0–0.1)
Eosinophils Absolute: 0.4 10*3/uL (ref 0.0–0.7)
HCT: 41.6 % (ref 39.0–52.0)
Hemoglobin: 14 g/dL (ref 13.0–17.0)
Lymphs Abs: 1.8 10*3/uL (ref 0.7–4.0)
MCHC: 33.5 g/dL (ref 30.0–36.0)
MCV: 96.9 fl (ref 78.0–100.0)
Monocytes Absolute: 1.4 10*3/uL — ABNORMAL HIGH (ref 0.1–1.0)
Monocytes Relative: 9.7 % (ref 3.0–12.0)
Neutro Abs: 11.1 10*3/uL — ABNORMAL HIGH (ref 1.4–7.7)
RDW: 12.4 % (ref 11.5–14.6)

## 2012-02-18 LAB — COMPREHENSIVE METABOLIC PANEL
AST: 23 U/L (ref 0–37)
BUN: 39 mg/dL — ABNORMAL HIGH (ref 6–23)
Calcium: 9.2 mg/dL (ref 8.4–10.5)
Chloride: 103 mEq/L (ref 96–112)
Creatinine, Ser: 1.7 mg/dL — ABNORMAL HIGH (ref 0.4–1.5)
Total Bilirubin: 0.7 mg/dL (ref 0.3–1.2)

## 2012-02-18 LAB — LIPID PANEL
HDL: 49 mg/dL (ref >39.00)
Total CHOL/HDL Ratio: 3
Triglycerides: 75 mg/dL (ref 0.0–149.0)
VLDL: 15 mg/dL (ref 0.0–40.0)

## 2012-02-18 LAB — TSH: TSH: 1.63 u[IU]/mL (ref 0.35–5.50)

## 2012-02-20 ENCOUNTER — Telehealth: Payer: Self-pay | Admitting: Internal Medicine

## 2012-02-20 NOTE — Telephone Encounter (Signed)
Keep rov thx

## 2012-02-20 NOTE — Telephone Encounter (Signed)
Adam Weeks, please, inform patient that all labs are normal except for elev Rheumatoid test. Keep ROV Thx

## 2012-02-20 NOTE — Telephone Encounter (Signed)
Left detailed mess informing pt of below.  

## 2012-02-23 NOTE — Telephone Encounter (Signed)
Pt already informed

## 2012-03-22 ENCOUNTER — Ambulatory Visit: Payer: Medicare Other | Admitting: Internal Medicine

## 2012-05-18 ENCOUNTER — Ambulatory Visit: Payer: Medicare Other | Admitting: Internal Medicine

## 2012-09-24 ENCOUNTER — Ambulatory Visit (INDEPENDENT_AMBULATORY_CARE_PROVIDER_SITE_OTHER): Payer: Medicare Other | Admitting: Internal Medicine

## 2012-09-24 ENCOUNTER — Encounter: Payer: Self-pay | Admitting: Internal Medicine

## 2012-09-24 ENCOUNTER — Ambulatory Visit (HOSPITAL_COMMUNITY)
Admission: RE | Admit: 2012-09-24 | Discharge: 2012-09-24 | Disposition: A | Discharge status: Home / Self Care | Payer: Medicare Other | Source: Ambulatory Visit | Attending: Internal Medicine | Admitting: Internal Medicine

## 2012-09-24 VITALS — BP 108/72 | HR 76 | Temp 98.6°F | Resp 16 | Wt 181.0 lb

## 2012-09-24 DIAGNOSIS — K5900 Constipation, unspecified: Secondary | ICD-10-CM

## 2012-09-24 DIAGNOSIS — M545 Low back pain, unspecified: Secondary | ICD-10-CM | POA: Insufficient documentation

## 2012-09-24 DIAGNOSIS — R109 Unspecified abdominal pain: Secondary | ICD-10-CM | POA: Insufficient documentation

## 2012-09-24 DIAGNOSIS — R141 Gas pain: Secondary | ICD-10-CM | POA: Insufficient documentation

## 2012-09-24 DIAGNOSIS — R142 Eructation: Secondary | ICD-10-CM | POA: Insufficient documentation

## 2012-09-24 DIAGNOSIS — R143 Flatulence: Secondary | ICD-10-CM | POA: Insufficient documentation

## 2012-09-24 MED ORDER — METHADONE HCL 10 MG PO TABS: 10.0000 mg | ORAL_TABLET | Freq: Three times a day (TID) | ORAL | Status: DC

## 2012-09-24 MED ORDER — LINACLOTIDE 290 MCG PO CAPS: 1.0000 | ORAL_CAPSULE | Freq: Every day | ORAL | Status: DC

## 2012-09-24 MED ORDER — ALIGN 4 MG PO CAPS: 1.0000 | ORAL_CAPSULE | Freq: Every day | ORAL | Status: DC

## 2012-09-24 NOTE — Progress Notes (Signed)
Subjective:    Patient ID: Adam Weeks, male    DOB: 02-21-1927, 78 y.o.   MRN: [id number]  Back Pain This is a chronic (acute on chronic) problem. The current episode started in the past 7 days. The problem occurs constantly. The problem has been gradually worsening since onset. The pain is present in the lumbar spine. The pain is at a severity of 9/10. The pain is severe. The symptoms are aggravated by bending and twisting. Pertinent negatives include no abdominal pain, chest pain or weakness. He has tried NSAIDs and walking for the symptoms. The treatment provided mild relief.  Constipation This is a recurrent problem. The current episode started 1 to 4 weeks ago (3 wks). Associated symptoms include back pain. Pertinent negatives include no abdominal pain, diarrhea or nausea.    The patient is here for a wellness exam. The patient has been doing well overall without major physical or psychological issues going on lately. The patient needs to address  chronic hypertension that has been well controlled with medicines; to address chronic  hyperlipidemia controlled with medicines as well; CAD, controlled with medical treatment and diet. C/o severe LBP all the time  BP Readings from Last 3 Encounters:  09/24/12 108/72  02/17/12 120/70  09/23/11 142/70   Wt Readings from Last 3 Encounters:  09/24/12 181 lb (82.101 kg)  02/17/12 177 lb (80.287 kg)  09/23/11 174 lb 12.8 oz (79.289 kg)     Review of Systems  Constitutional: Negative for appetite change, fatigue and unexpected weight change.  HENT: Negative for nosebleeds, congestion, sore throat, sneezing, trouble swallowing and neck pain.   Eyes: Negative for itching and visual disturbance.  Respiratory: Negative for cough.   Cardiovascular: Negative for chest pain, palpitations and leg swelling.  Gastrointestinal: Positive for constipation. Negative for nausea, abdominal pain, diarrhea, Weeks in stool and abdominal distention.    Genitourinary: Negative for frequency and hematuria.  Musculoskeletal: Positive for myalgias, back pain, arthralgias and gait problem. Negative for joint swelling.  Skin: Negative for rash.  Neurological: Negative for dizziness, tremors, speech difficulty and weakness.  Psychiatric/Behavioral: Negative for suicidal ideas, confusion, disturbed wake/sleep cycle, dysphoric mood and agitation. The patient is not nervous/anxious.        Objective:   Physical Exam  Constitutional: He is oriented to person, place, and time. He appears well-developed and well-nourished. No distress.  HENT:  Head: Normocephalic and atraumatic.  Right Ear: External ear normal.  Left Ear: External ear normal.  Nose: Nose normal.  Mouth/Throat: Oropharynx is clear and moist. No oropharyngeal exudate.  Eyes: Conjunctivae normal and EOM are normal. Pupils are equal, round, and reactive to light. Right eye exhibits no discharge. Left eye exhibits no discharge. No scleral icterus.  Neck: Normal range of motion. Neck supple. No JVD present. No tracheal deviation present. No thyromegaly present.  Cardiovascular: Normal rate, regular rhythm, normal heart sounds and intact distal pulses.  Exam reveals no gallop and no friction rub.   No murmur heard. Pulmonary/Chest: Effort normal and breath sounds normal. No stridor. No respiratory distress. He has no wheezes. He has no rales. He exhibits no tenderness.  Abdominal: Soft. Bowel sounds are normal. He exhibits no distension and no mass. There is no tenderness. There is no rebound and no guarding.  Genitourinary: Rectum normal, prostate normal and penis normal. Guaiac negative stool. No penile tenderness.  Musculoskeletal: Normal range of motion. He exhibits tenderness (LS spine). He exhibits no edema.  Lymphadenopathy:    He has  no cervical adenopathy.  Neurological: He is alert and oriented to person, place, and time. He has normal reflexes. No cranial nerve deficit. He  exhibits normal muscle tone. Coordination normal.  Skin: Skin is warm and dry. No rash noted. He is not diaphoretic. No erythema. No pallor.  Psychiatric: He has a normal mood and affect. His behavior is normal. Judgment and thought content normal.    Lab Results  Component Value Date   WBC 14.8* 02/18/2012   HGB 14.0 02/18/2012   HCT 41.6 02/18/2012   PLT 141.0* 02/18/2012   GLUCOSE 94 02/18/2012   CHOL 124 02/18/2012   TRIG 75.0 02/18/2012   HDL 49.00 02/18/2012   LDLCALC 60 02/18/2012   ALT 19 02/18/2012   AST 23 02/18/2012   NA 142 02/18/2012   K 4.6 02/18/2012   CL 103 02/18/2012   CREATININE 1.7* 02/18/2012   BUN 39* 02/18/2012   CO2 29 02/18/2012   TSH 1.63 02/18/2012   PSA 0.66 02/18/2012   INR 1.49 11/21/2009         Assessment & Plan:

## 2012-09-24 NOTE — Patient Instructions (Signed)
Fleet enema as needed

## 2012-09-24 NOTE — Assessment & Plan Note (Signed)
LS xray See meds

## 2012-09-24 NOTE — Assessment & Plan Note (Signed)
Abd x ray  Linzess

## 2012-09-25 ENCOUNTER — Telehealth: Payer: Self-pay | Admitting: Internal Medicine

## 2012-09-25 NOTE — Telephone Encounter (Signed)
Adam Weeks, please, inform patient that his back X ray does not show any Fx's,. Abd Xray - constipation. Rx as we discussed. ROV Thx

## 2012-09-27 ENCOUNTER — Telehealth: Payer: Self-pay | Admitting: *Deleted

## 2012-09-27 NOTE — Telephone Encounter (Signed)
Left mess for patient to call back.  

## 2012-09-27 NOTE — Telephone Encounter (Signed)
Pt's GF calling for results of xray of Abdomen and Lumbar spine that pt had done on 09/24/2012

## 2012-09-27 NOTE — Telephone Encounter (Signed)
See 9.28 tel note pls Thx

## 2012-09-28 ENCOUNTER — Emergency Department (HOSPITAL_COMMUNITY)
Admission: EM | Admit: 2012-09-28 | Discharge: 2012-09-29 | Disposition: A | Discharge status: Home / Self Care | Payer: Medicare Other | Attending: Emergency Medicine | Admitting: Emergency Medicine

## 2012-09-28 ENCOUNTER — Telehealth: Payer: Self-pay | Admitting: Internal Medicine

## 2012-09-28 ENCOUNTER — Encounter (HOSPITAL_COMMUNITY): Payer: Self-pay | Admitting: Emergency Medicine

## 2012-09-28 ENCOUNTER — Emergency Department (HOSPITAL_COMMUNITY): Payer: Medicare Other

## 2012-09-28 DIAGNOSIS — I2510 Atherosclerotic heart disease of native coronary artery without angina pectoris: Secondary | ICD-10-CM | POA: Insufficient documentation

## 2012-09-28 DIAGNOSIS — I10 Essential (primary) hypertension: Secondary | ICD-10-CM | POA: Insufficient documentation

## 2012-09-28 DIAGNOSIS — Z79899 Other long term (current) drug therapy: Secondary | ICD-10-CM | POA: Insufficient documentation

## 2012-09-28 DIAGNOSIS — M549 Dorsalgia, unspecified: Secondary | ICD-10-CM | POA: Insufficient documentation

## 2012-09-28 DIAGNOSIS — R109 Unspecified abdominal pain: Secondary | ICD-10-CM | POA: Insufficient documentation

## 2012-09-28 DIAGNOSIS — R10811 Right upper quadrant abdominal tenderness: Secondary | ICD-10-CM | POA: Insufficient documentation

## 2012-09-28 DIAGNOSIS — K8020 Calculus of gallbladder without cholecystitis without obstruction: Secondary | ICD-10-CM

## 2012-09-28 DIAGNOSIS — Z87442 Personal history of urinary calculi: Secondary | ICD-10-CM | POA: Insufficient documentation

## 2012-09-28 LAB — COMPREHENSIVE METABOLIC PANEL
ALT: 13 U/L (ref 0–53)
Albumin: 3.3 g/dL — ABNORMAL LOW (ref 3.5–5.2)
BUN: 20 mg/dL (ref 6–23)
Calcium: 9.3 mg/dL (ref 8.4–10.5)
GFR calc Af Amer: 49 mL/min — ABNORMAL LOW (ref >90)
Glucose, Bld: 112 mg/dL — ABNORMAL HIGH (ref 70–99)
Potassium: 4.9 mEq/L (ref 3.5–5.1)
Sodium: 139 mEq/L (ref 135–145)
Total Protein: 6.9 g/dL (ref 6.0–8.3)

## 2012-09-28 LAB — CBC WITH DIFFERENTIAL/PLATELET
Basophils Relative: 1 % (ref 0–1)
Eosinophils Absolute: 0.6 10*3/uL (ref 0.0–0.7)
Eosinophils Relative: 8 % — ABNORMAL HIGH (ref 0–5)
Lymphs Abs: 2.1 10*3/uL (ref 0.7–4.0)
MCH: 32.6 pg (ref 26.0–34.0)
MCHC: 33.6 g/dL (ref 30.0–36.0)
MCV: 96.9 fL (ref 78.0–100.0)
Neutrophils Relative %: 50 % (ref 43–77)
Platelets: 257 10*3/uL (ref 150–400)
RBC: 3.87 MIL/uL — ABNORMAL LOW (ref 4.22–5.81)

## 2012-09-28 LAB — LIPASE, BLOOD: Lipase: 34 U/L (ref 11–59)

## 2012-09-28 LAB — URINALYSIS, ROUTINE W REFLEX MICROSCOPIC
Nitrite: NEGATIVE
Specific Gravity, Urine: 1.006 (ref 1.005–1.030)
Urobilinogen, UA: 0.2 mg/dL (ref 0.0–1.0)
pH: 5.5 (ref 5.0–8.0)

## 2012-09-28 MED ORDER — SODIUM CHLORIDE 0.9 % IV BOLUS (SEPSIS)
750.0000 mL | Freq: Once | INTRAVENOUS | Status: AC
  Administered 2012-09-28: 750 mL via INTRAVENOUS

## 2012-09-28 MED ORDER — ONDANSETRON HCL 4 MG/2ML IJ SOLN
4.0000 mg | Freq: Once | INTRAMUSCULAR | Status: AC
  Administered 2012-09-28: 4 mg via INTRAVENOUS
  Filled 2012-09-28: qty 2

## 2012-09-28 MED ORDER — SODIUM CHLORIDE 0.9 % IV SOLN: INTRAVENOUS | Status: DC

## 2012-09-28 MED ORDER — HYDROMORPHONE HCL PF 1 MG/ML IJ SOLN
1.0000 mg | Freq: Once | INTRAMUSCULAR | Status: AC
  Administered 2012-09-28: 1 mg via INTRAVENOUS
  Filled 2012-09-28: qty 1

## 2012-09-28 NOTE — ED Provider Notes (Signed)
History     CSN: [id number]  Arrival date & time 09/28/12  1713   First MD Initiated Contact with Patient 09/28/12 1748      Chief Complaint  Patient presents with  . Back Pain    (Consider location/radiation/quality/duration/timing/severity/associated sxs/prior treatment) HPI  Patient reports he was having abdominal pain last week and was seen at the [facility] and by his PCP, Dr. Roderic Scarce 4 days ago and he was given an enema for constipation and patient reports he had really good results. He however continues to complain of some low back pain. He states it's the same pain he's had "all my life" when he is not feeling well. He also states she's having some abdominal pain this afternoon and she indicates his right upper quadrant and states sometimes it feels like it radiates into his back. He states his pain is worse on the right but he also radiates across his back. Patient reports he was seen at the [place] 1 week ago and they changed his pain medicine for his chronic back pain from Vicodin to oxycodone. He also was called back that he had blood in his urine. He states he has borderline kidney problems for the past 10 years and his baseline creatinine is 1.7 and they told him his creatinine had gone up to 2.5. He also states she's had renal stones in the past but they are worse and the pain is having now. He also states he knows he has gallstones. His girlfriend reports all he does is sleep and he is not eating.  PCP Dr Posey Rea [facility]  Past Medical History  Diagnosis Date  . Osteoporosis   . GERD (gastroesophageal reflux disease)   . Hyperlipidemia   . Hypertension   . CAD (coronary artery disease)   . Chronic kidney disease     stones    Past Surgical History  Procedure Date  . Cardiac valve replacement 2009    pig valve -- AVR  . Coronary artery bypass graft     2009  . Hernia repair     Family History  Problem Relation Age of Onset  . Cancer Mother 20    breast ca  . Heart  disease Father     CAD    History  Substance Use Topics  . Smoking status: Former Games developer  . Smokeless tobacco: Not on file  . Alcohol Use: No   Lives at home Lives alone   Review of Systems  All other systems reviewed and are negative.    Allergies  Review of patient's allergies indicates no known allergies.  Home Medications   Current Outpatient Rx  Name Route Sig Dispense Refill  . ALENDRONATE SODIUM 70 MG PO TABS Oral Take 70 mg by mouth every 7 (seven) days. Take with a full glass of water on an empty stomach.     . AMLODIPINE BESYLATE 10 MG PO TABS Oral Take 10 mg by mouth daily.     [name] CARVEDILOL 6.25 MG PO TABS Oral Take 6.25 mg by mouth 2 (two) times daily with a meal.    . CHOLECALCIFEROL 1000 UNITS PO TABS Oral Take 1,000 Units by mouth daily.      [name] CLONAZEPAM 1 MG PO TABS Oral Take 1 mg by mouth at bedtime as needed. For leg cramps or sleep    . FOLIC ACID 1 MG PO TABS Oral Take 2 mg by mouth daily.      . FUROSEMIDE 40 MG PO TABS Oral  Take 40 mg by mouth daily.      [name] HYDROCODONE-ACETAMINOPHEN 10-325 MG PO TABS Oral Take 1 tablet by mouth 4 (four) times daily as needed. For pain    . LINACLOTIDE 290 MCG PO CAPS Oral Take 1 capsule by mouth daily. 30 capsule 11  . LORATADINE 10 MG PO TABS Oral Take 10 mg by mouth daily.      [name] METHADONE HCL 10 MG PO TABS Oral Take 1 tablet (10 mg total) by mouth 3 (three) times daily. 120 tablet 0  . OCUVITE PRESERVISION PO TABS Oral Take 2 tablets by mouth 2 (two) times daily.      [name] FISH OIL 1000 MG PO CAPS Oral Take 2 capsules by mouth 2 (two) times daily.     [name] OMEPRAZOLE 20 MG PO CPDR Oral Take 20 mg by mouth daily.      [name] ALIGN 4 MG PO CAPS Oral Take 1 capsule by mouth daily. 30 capsule 1  . ROSUVASTATIN CALCIUM 20 MG PO TABS Oral Take 10 mg by mouth daily.     Raul Del SODIUM 8.6-50 MG PO TABS Oral Take 2 tablets by mouth 2 (two) times daily.     . VENLAFAXINE HCL 75 MG PO TABS Oral Take 75 mg by mouth daily.       BP 128/69  Pulse 75  Temp 98.2 F (36.8 C)  Resp 16  SpO2 95%  Vital signs normal    Physical Exam  Nursing note and vitals reviewed. Constitutional: He is oriented to person, place, and time. He appears well-developed and well-nourished.  Non-toxic appearance. He does not appear ill. No distress.  HENT:  Head: Normocephalic and atraumatic.  Right Ear: External ear normal.  Left Ear: External ear normal.  Nose: Nose normal. No mucosal edema or rhinorrhea.  Mouth/Throat: Oropharynx is clear and moist and mucous membranes are normal. No dental abscesses or uvula swelling.  Eyes: Conjunctivae normal and EOM are normal. Pupils are equal, round, and reactive to light.  Neck: Normal range of motion and full passive range of motion without pain. Neck supple.  Cardiovascular: Normal rate, regular rhythm and normal heart sounds.  Exam reveals no gallop and no friction rub.   No murmur heard. Pulmonary/Chest: Effort normal and breath sounds normal. No respiratory distress. He has no wheezes. He has no rhonchi. He has no rales. He exhibits no tenderness and no crepitus.  Abdominal: Soft. Normal appearance and bowel sounds are normal. He exhibits no distension. There is tenderness in the right upper quadrant. There is no rebound and no guarding.  Musculoskeletal: Normal range of motion. He exhibits no edema and no tenderness.       Moves all extremities well. Patient's noted be laying on his left side for comfort. He has diffuse tenderness in his right flank and in his lumbar and paraspinous muscles.  Neurological: He is alert and oriented to person, place, and time. He has normal strength. No cranial nerve deficit.  Skin: Skin is warm, dry and intact. No rash noted. No erythema. No pallor.  Psychiatric: He has a normal mood and affect. His speech is normal and behavior is normal. His mood appears not anxious.    ED Course  Procedures (including critical care time)   Medications  0.9 %   sodium chloride infusion (not administered)  sodium chloride 0.9 % bolus 750 mL (750 mL Intravenous Given 09/28/12 2000)  ondansetron (ZOFRAN) injection 4 mg (4 mg Intravenous Given 09/28/12 1959)  HYDROmorphone (DILAUDID) injection 1 mg (1 mg Intravenous Given 09/28/12 1959)   Patient noted to have unfilled prescription from Dr. Nolon Lennert for [facility] and align  Pt feeling better and appears more comfortable at time of discharge. He also appears more alert and is smiling.   Results for orders placed during the hospital encounter of 09/28/12  CBC WITH DIFFERENTIAL      Component Value Range   WBC 7.9  4.0 - 10.5 K/uL   RBC 3.87 (*) 4.22 - 5.81 MIL/uL   Hemoglobin 12.6 (*) 13.0 - 17.0 g/dL   HCT 45.4 (*) 09.8 - 11.9 %   MCV 96.9  78.0 - 100.0 fL   MCH 32.6  26.0 - 34.0 pg   MCHC 33.6  30.0 - 36.0 g/dL   RDW 14.7  82.9 - 56.2 %   Platelets 257  150 - 400 K/uL   Neutrophils Relative 50  43 - 77 %   Neutro Abs 4.0  1.7 - 7.7 K/uL   Lymphocytes Relative 26  12 - 46 %   Lymphs Abs 2.1  0.7 - 4.0 K/uL   Monocytes Relative 15 (*) 3 - 12 %   Monocytes Absolute 1.2 (*) 0.1 - 1.0 K/uL   Eosinophils Relative 8 (*) 0 - 5 %   Eosinophils Absolute 0.6  0.0 - 0.7 K/uL   Basophils Relative 1  0 - 1 %   Basophils Absolute 0.1  0.0 - 0.1 K/uL  COMPREHENSIVE METABOLIC PANEL      Component Value Range   Sodium 139  135 - 145 mEq/L   Potassium 4.9  3.5 - 5.1 mEq/L   Chloride 100  96 - 112 mEq/L   CO2 30  19 - 32 mEq/L   Glucose, Bld 112 (*) 70 - 99 mg/dL   BUN 20  6 - 23 mg/dL   Creatinine, Ser 1.30 (*) 0.50 - 1.35 mg/dL   Calcium 9.3  8.4 - 86.5 mg/dL   Total Protein 6.9  6.0 - 8.3 g/dL   Albumin 3.3 (*) 3.5 - 5.2 g/dL   AST 22  0 - 37 U/L   ALT 13  0 - 53 U/L   Alkaline Phosphatase 45  39 - 117 U/L   Total Bilirubin 0.3  0.3 - 1.2 mg/dL   GFR calc non Af Amer 42 (*) >90 mL/min   GFR calc Af Amer 49 (*) >90 mL/min  LIPASE, BLOOD      Component Value Range   Lipase 34  11 - 59  U/L  URINALYSIS, ROUTINE W REFLEX MICROSCOPIC      Component Value Range   Color, Urine YELLOW  YELLOW   APPearance CLEAR  CLEAR   Specific Gravity, Urine 1.006  1.005 - 1.030   pH 5.5  5.0 - 8.0   Glucose, UA NEGATIVE  NEGATIVE mg/dL   Hgb urine dipstick NEGATIVE  NEGATIVE   Bilirubin Urine NEGATIVE  NEGATIVE   Ketones, ur NEGATIVE  NEGATIVE mg/dL   Protein, ur NEGATIVE  NEGATIVE mg/dL   Urobilinogen, UA 0.2  0.0 - 1.0 mg/dL   Nitrite NEGATIVE  NEGATIVE   Leukocytes, UA NEGATIVE  NEGATIVE   Laboratory interpretation all normal    US Abdomen Limited  09/28/2012  *RADIOLOGY REPORT*  Clinical Data: Abdominal pain  ABDOMEN ULTRASOUND  Technique:  Complete abdominal ultrasound examination was performed including evaluation of the liver, gallbladder, bile ducts, pancreas, kidneys, spleen, IVC, and abdominal aorta.  Comparison: CT scan from 11/19/2009  Findings:  Gallbladder:  Multiple small layering stones are evident, is also seen on the previous CT scan.  These measure in the 5-7 mm size range.  No gallbladder wall thickening or pericholecystic fluid. The sonographer reports no sonographic Murphy's sign.  Common Bile Duct:  Nondilated at 5 mm diameter.  Liver:  Coarsening of the hepatic echotexture suggest a degree of fatty infiltration.  There is no intrahepatic mass lesion evident. The no evidence for intrahepatic biliary duct dilatation.  IVC:  Normal.  Pancreas:  Obscured by overlying bowel gas.  Spleen:  Normal.  Right kidney:  11.4 cm in long axis.  Cortical echogenicity is increased relative to the index organ of the liver.  Multiple cortical cystic lesions are identified, measuring up to 3.8 cm in diameter.  The largest 3.8 cm cyst has simple features.  Left kidney:  11.3 cm in long axis.  Increased cortical echogenicity noted.  No hydronephrosis.  Large cyst in the posterior interpolar left kidney measures up to 7.2 cm in diameter. This is not substantially changed since the prior CT.  No  evidence for hydronephrosis.  Abdominal Aorta:  Obscured by midline bowel gas.  IMPRESSION: Cholelithiasis without gallbladder wall thickening or pericholecystic fluid.  Bilateral renal cysts.  The dominant cysts in each kidney are not substantially changed since the CT scan of almost 3 years ago.   Original Report Authenticated By: ERIC A. MANSELL, M.D.     Dg Lumbar Spine Complete  09/24/2012  *RADIOLOGY REPORT*  Clinical Data: Lumbar spine pain, evaluate for compression.  LUMBAR SPINE - COMPLETE 4+ VIEW  Comparison: CT abdomen pelvis dated 12/26/2006  Findings: Mild irregularity of the inferior endplate at T12, age indeterminate but favored to be chronic, new from 2007.  Vertebral body heights are otherwise maintained.  Moderate to severe multilevel degenerative changes.  Lumbar dextroscoliosis.  Postsurgical changes/surgical hardware posteriorly at L3 and L4.  Vascular calcifications.  IMPRESSION: No evidence of fracture or dislocation in the lumbar spine.  Mild irregularity of the inferior endplate at T12, age indeterminate but favored to be chronic.  Moderate to severe multilevel degenerative changes.   Original Report Authenticated By: Charline Bills, M.D.    Dg Abd 2 Views  09/24/2012  *RADIOLOGY REPORT*  Clinical Data: Abdominal pain/bloating, constipation  ABDOMEN - 2 VIEW  Comparison: CT abdomen pelvis dated 11/19/2009  Findings: Nonobstructive bowel gas pattern.  No evidence of free air under the diaphragm on the upright view.  Moderate stool throughout the colon.  Curvilinear calcification in the left mid abdomen, corresponding to a rim calcified renal cyst on prior CT.  Degenerative changes of the visualized thoracolumbar spine.  IMPRESSION: No evidence of small bowel obstruction or free air.  Moderate stool throughout the colon.   Original Report Authenticated By: Charline Bills, M.D.      1. Back pain   2. Abdominal pain   3. Gallstones    Plan discharge  Devoria Albe, MD,  FACEP    MDM      Ward Givens, MD 09/28/12 [phone]

## 2012-09-28 NOTE — Telephone Encounter (Signed)
Caller: Frances/Other; Patient Name: Adam Weeks; PCP: Sonda Primes (Adults only); Best Callback Phone Number: [phone].  States seen in office 09/24/12 and xrays done of lower spine and abdomen, but states she has not heard back from office.  Per Epic, Dr. Posey Rea reviewed xray results 09/25/12; RN advised per Epic note that back xray showed no fractures; abdominal film showed constipation, and patient is to continue his care as discussed.  Spouse concerned about patient not improving and  "having a bad day."  States seen at the [facility] hospital a week ago, and had blood in the urine.  Still c/o flank pain.  States he is urinating more frequently than usual, and he states it burns in his back when he voids.  States his creatinine at the [facility] 09/20/12 was 2.5, up from 1.7 per Epic in February 2013.  States he is "just not himself," and will not eat.  Per flank pain protocol, advised being seen within 4 hours, as office hours are finished.  States will take to [facility].

## 2012-09-28 NOTE — ED Notes (Addendum)
C/o Back pain started 10 days ago- had appt at [place] last tues with u/a, got a call from dr saying that lab work was abnormal. Told to come to ED. Not eating good, not drinking good, vomited Saturday. No BM in three weeks until Friday, after using fleets enema. Had xrays on lumbar spine, abd Friday. BUN/CREATINE elevated.

## 2012-09-28 NOTE — ED Notes (Signed)
Patient transported to Ultrasound 

## 2012-09-28 NOTE — Telephone Encounter (Signed)
Agree. Thx 

## 2012-09-28 NOTE — Telephone Encounter (Signed)
Closing phone note 

## 2012-09-28 NOTE — ED Notes (Signed)
Family at bedside. 

## 2012-09-28 NOTE — ED Notes (Signed)
Pt given a urinal.

## 2012-09-28 NOTE — ED Notes (Signed)
Returned from U/S

## 2012-09-29 NOTE — ED Notes (Signed)
Patient is alert and oriented x3.  He was given DC instructions and follow up visit instructions.  Patient gave verbal understanding.  He was DC ambulatory under his own power to home.  V/S stable.  He was not showing any signs of distress on DC 

## 2012-09-29 NOTE — Telephone Encounter (Signed)
Left detailed mess informing pt of below.  

## 2012-10-01 ENCOUNTER — Ambulatory Visit (INDEPENDENT_AMBULATORY_CARE_PROVIDER_SITE_OTHER): Payer: Medicare Other | Admitting: Internal Medicine

## 2012-10-01 ENCOUNTER — Encounter: Payer: Self-pay | Admitting: Internal Medicine

## 2012-10-01 VITALS — BP 120/64 | HR 72 | Temp 97.7°F | Resp 16 | Wt 177.8 lb

## 2012-10-01 DIAGNOSIS — K5900 Constipation, unspecified: Secondary | ICD-10-CM

## 2012-10-01 DIAGNOSIS — K8020 Calculus of gallbladder without cholecystitis without obstruction: Secondary | ICD-10-CM

## 2012-10-01 DIAGNOSIS — I2510 Atherosclerotic heart disease of native coronary artery without angina pectoris: Secondary | ICD-10-CM

## 2012-10-01 DIAGNOSIS — I10 Essential (primary) hypertension: Secondary | ICD-10-CM

## 2012-10-01 DIAGNOSIS — M545 Low back pain: Secondary | ICD-10-CM

## 2012-10-01 MED ORDER — CARVEDILOL 6.25 MG PO TABS: 12.5000 mg | ORAL_TABLET | Freq: Two times a day (BID) | ORAL | Status: DC

## 2012-10-01 MED ORDER — HYDROMORPHONE HCL 4 MG PO TABS: 4.0000 mg | ORAL_TABLET | ORAL | Status: DC | PRN

## 2012-10-01 MED ORDER — HYDROMORPHONE HCL 2 MG PO TABS: 2.0000 mg | ORAL_TABLET | ORAL | Status: DC | PRN

## 2012-10-01 MED ORDER — CIPROFLOXACIN HCL 500 MG PO TABS: 500.0000 mg | ORAL_TABLET | Freq: Two times a day (BID) | ORAL | Status: DC

## 2012-10-01 NOTE — Assessment & Plan Note (Signed)
ABDOMEN ULTRASOUND  Technique: Complete abdominal ultrasound examination was performed  including evaluation of the liver, gallbladder, bile ducts,  pancreas, kidneys, spleen, IVC, and abdominal aorta.  Comparison: CT scan from 11/19/2009  Findings:  Gallbladder: Multiple small layering stones are evident, is also  seen on the previous CT scan. These measure in the 5-7 mm size  range. No gallbladder wall thickening or pericholecystic fluid.  The sonographer reports no sonographic Murphy's sign.  Common Bile Duct: Nondilated at 5 mm diameter.  Liver: Coarsening of the hepatic echotexture suggest a degree of  fatty infiltration. There is no intrahepatic mass lesion evident.  The no evidence for intrahepatic biliary duct dilatation.  IVC: Normal.  Pancreas: Obscured by overlying bowel gas.  Spleen: Normal.  Right kidney: 11.4 cm in long axis. Cortical echogenicity is  increased relative to the index organ of the liver. Multiple  cortical cystic lesions are identified, measuring up to 3.8 cm in  diameter. The largest 3.8 cm cyst has simple features.  Left kidney: 11.3 cm in long axis. Increased cortical  echogenicity noted. No hydronephrosis. Large cyst in the  posterior interpolar left kidney measures up to 7.2 cm in diameter.  This is not substantially changed since the prior CT. No evidence  for hydronephrosis.  Abdominal Aorta: Obscured by midline bowel gas.  IMPRESSION:  Cholelithiasis without gallbladder wall thickening or  pericholecystic fluid.  Bilateral renal cysts. The dominant cysts in each kidney are not  substantially changed since the CT scan of almost 3 years ago.  Original Report Authenticated By: ERIC A. MANSELL, M.D.   Surg consult Empiric Cipro

## 2012-10-01 NOTE — Assessment & Plan Note (Signed)
Resolved on Rx 

## 2012-10-01 NOTE — Assessment & Plan Note (Addendum)
09/24/12 LS spine:  IMPRESSION:  No evidence of fracture or dislocation in the lumbar spine.  Mild irregularity of the inferior endplate at T12, age  indeterminate but favored to be chronic.  Moderate to severe multilevel degenerative changes.   MRI LS spine  - unable to do due to rods NS consult if worse  I'll order a spine CT

## 2012-10-01 NOTE — Assessment & Plan Note (Signed)
Continue with current prescription therapy as reflected on the Med list.  

## 2012-10-03 ENCOUNTER — Encounter: Payer: Self-pay | Admitting: Internal Medicine

## 2012-10-03 NOTE — Assessment & Plan Note (Signed)
Continue with current prescription therapy as reflected on the Med list.  

## 2012-10-03 NOTE — Progress Notes (Signed)
Subjective:    Patient ID: Adam Weeks, male    DOB: 07-28-1927, 78 y.o.   MRN: [id number]  Back Pain This is a chronic (acute on chronic) problem. The current episode started in the past 7 days. The problem occurs constantly. The problem has been gradually worsening since onset. The pain is present in the lumbar spine. The pain is at a severity of 9/10. The pain is severe. The symptoms are aggravated by bending and twisting. Pertinent negatives include no abdominal pain, chest pain or weakness. He has tried NSAIDs and walking for the symptoms. The treatment provided mild relief.  Constipation This is a recurrent problem. The current episode started 1 to 4 weeks ago (3 wks). Associated symptoms include back pain. Pertinent negatives include no abdominal pain, diarrhea or nausea.    The patient is here for a wellness exam. The patient has been doing well overall without major physical or psychological issues going on lately. The patient needs to address  chronic hypertension that has been well controlled with medicines; to address chronic  hyperlipidemia controlled with medicines as well; CAD, controlled with medical treatment and diet. C/o severe LBP all the time  BP Readings from Last 3 Encounters:  10/01/12 120/64  09/28/12 119/55  09/24/12 108/72   Wt Readings from Last 3 Encounters:  10/01/12 177 lb 12 oz (80.627 kg)  09/24/12 181 lb (82.101 kg)  02/17/12 177 lb (80.287 kg)     Review of Systems  Constitutional: Negative for appetite change, fatigue and unexpected weight change.  HENT: Negative for nosebleeds, congestion, sore throat, sneezing, trouble swallowing and neck pain.   Eyes: Negative for itching and visual disturbance.  Respiratory: Negative for cough.   Cardiovascular: Negative for chest pain, palpitations and leg swelling.  Gastrointestinal: Positive for constipation. Negative for nausea, abdominal pain, diarrhea, Weeks in stool and abdominal distention.    Genitourinary: Negative for frequency and hematuria.  Musculoskeletal: Positive for myalgias, back pain, arthralgias and gait problem. Negative for joint swelling.  Skin: Negative for rash.  Neurological: Negative for dizziness, tremors, speech difficulty and weakness.  Psychiatric/Behavioral: Negative for suicidal ideas, confusion, disturbed wake/sleep cycle, dysphoric mood and agitation. The patient is not nervous/anxious.        Objective:   Physical Exam  Constitutional: He is oriented to person, place, and time. He appears well-developed and well-nourished. No distress.  HENT:  Head: Normocephalic and atraumatic.  Right Ear: External ear normal.  Left Ear: External ear normal.  Nose: Nose normal.  Mouth/Throat: Oropharynx is clear and moist. No oropharyngeal exudate.  Eyes: Conjunctivae normal and EOM are normal. Pupils are equal, round, and reactive to light. Right eye exhibits no discharge. Left eye exhibits no discharge. No scleral icterus.  Neck: Normal range of motion. Neck supple. No JVD present. No tracheal deviation present. No thyromegaly present.  Cardiovascular: Normal rate, regular rhythm, normal heart sounds and intact distal pulses.  Exam reveals no gallop and no friction rub.   No murmur heard. Pulmonary/Chest: Effort normal and breath sounds normal. No stridor. No respiratory distress. He has no wheezes. He has no rales. He exhibits no tenderness.  Abdominal: Soft. Bowel sounds are normal. He exhibits no distension and no mass. There is no tenderness. There is no rebound and no guarding.  Genitourinary: Rectum normal, prostate normal and penis normal. Guaiac negative stool. No penile tenderness.  Musculoskeletal: Normal range of motion. He exhibits tenderness (LS spine). He exhibits no edema.  Lymphadenopathy:    He  has no cervical adenopathy.  Neurological: He is alert and oriented to person, place, and time. He has normal reflexes. No cranial nerve deficit. He  exhibits normal muscle tone. Coordination normal.  Skin: Skin is warm and dry. No rash noted. He is not diaphoretic. No erythema. No pallor.  Psychiatric: He has a normal mood and affect. His behavior is normal. Judgment and thought content normal.    Lab Results  Component Value Date   WBC 7.9 09/28/2012   HGB 12.6* 09/28/2012   HCT 37.5* 09/28/2012   PLT 257 09/28/2012   GLUCOSE 112* 09/28/2012   CHOL 124 02/18/2012   TRIG 75.0 02/18/2012   HDL 49.00 02/18/2012   LDLCALC 60 02/18/2012   ALT 13 09/28/2012   AST 22 09/28/2012   NA 139 09/28/2012   K 4.9 09/28/2012   CL 100 09/28/2012   CREATININE 1.47* 09/28/2012   BUN 20 09/28/2012   CO2 30 09/28/2012   TSH 1.63 02/18/2012   PSA 0.66 02/18/2012   INR 1.49 11/21/2009         Assessment & Plan:   A complex case

## 2012-10-08 ENCOUNTER — Ambulatory Visit (INDEPENDENT_AMBULATORY_CARE_PROVIDER_SITE_OTHER): Payer: Medicare Other | Admitting: Internal Medicine

## 2012-10-08 ENCOUNTER — Encounter: Payer: Self-pay | Admitting: Internal Medicine

## 2012-10-08 VITALS — BP 128/60 | HR 68 | Temp 98.7°F | Resp 16 | Wt 173.0 lb

## 2012-10-08 DIAGNOSIS — K8020 Calculus of gallbladder without cholecystitis without obstruction: Secondary | ICD-10-CM

## 2012-10-08 DIAGNOSIS — I2510 Atherosclerotic heart disease of native coronary artery without angina pectoris: Secondary | ICD-10-CM

## 2012-10-08 DIAGNOSIS — S301XXA Contusion of abdominal wall, initial encounter: Secondary | ICD-10-CM

## 2012-10-08 DIAGNOSIS — K5792 Diverticulitis of intestine, part unspecified, without perforation or abscess without bleeding: Secondary | ICD-10-CM

## 2012-10-08 DIAGNOSIS — K5900 Constipation, unspecified: Secondary | ICD-10-CM

## 2012-10-08 DIAGNOSIS — M545 Low back pain, unspecified: Secondary | ICD-10-CM

## 2012-10-08 DIAGNOSIS — K5732 Diverticulitis of large intestine without perforation or abscess without bleeding: Secondary | ICD-10-CM

## 2012-10-08 MED ORDER — HYDROMORPHONE HCL 4 MG PO TABS: 4.0000 mg | ORAL_TABLET | ORAL | Status: DC | PRN

## 2012-10-08 MED ORDER — METRONIDAZOLE 500 MG PO TABS: 500.0000 mg | ORAL_TABLET | Freq: Three times a day (TID) | ORAL | Status: DC

## 2012-10-08 NOTE — Progress Notes (Signed)
Subjective:    Patient ID: Adam Weeks, male    DOB: 08/11/1927, 78 y.o.   MRN: [id number]  Back Pain This is a chronic (acute on chronic) problem. The current episode started in the past 7 days. The problem occurs constantly. The problem has been gradually worsening since onset. The pain is present in the lumbar spine. The pain is at a severity of 9/10. The pain is severe. The symptoms are aggravated by bending and twisting. Pertinent negatives include no abdominal pain, chest pain or weakness. He has tried NSAIDs and walking for the symptoms. The treatment provided mild relief.  Constipation This is a recurrent problem. The current episode started 1 to 4 weeks ago (3 wks). Associated symptoms include back pain. Pertinent negatives include no abdominal pain, diarrhea or nausea.  BMs are better  He went to [place] [facility] and had a CT on Monday: diverticulitis. He was already on Cipro C/o severe LBP all the time; not better. He is nauseated. He is weak poor appetite. He fell a couple days ago and hit his R flank.  BP Readings from Last 3 Encounters:  10/08/12 128/60  10/01/12 120/64  09/28/12 119/55   Wt Readings from Last 3 Encounters:  10/08/12 173 lb (78.472 kg)  10/01/12 177 lb 12 oz (80.627 kg)  09/24/12 181 lb (82.101 kg)     Review of Systems  Constitutional: Negative for appetite change, fatigue and unexpected weight change.  HENT: Negative for nosebleeds, congestion, sore throat, sneezing, trouble swallowing and neck pain.   Eyes: Negative for itching and visual disturbance.  Respiratory: Negative for cough.   Cardiovascular: Negative for chest pain, palpitations and leg swelling.  Gastrointestinal: Positive for constipation. Negative for nausea, abdominal pain, diarrhea, Weeks in stool and abdominal distention.  Genitourinary: Negative for frequency and hematuria.  Musculoskeletal: Positive for myalgias, back pain, arthralgias and gait problem. Negative for joint swelling.  Skin:  Negative for rash.  Neurological: Negative for dizziness, tremors, speech difficulty and weakness.  Psychiatric/Behavioral: Negative for suicidal ideas, confusion, disturbed wake/sleep cycle, dysphoric mood and agitation. The patient is not nervous/anxious.        Objective:   Physical Exam  Constitutional: He is oriented to person, place, and time. He appears well-developed and well-nourished. No distress.  HENT:  Head: Normocephalic and atraumatic.  Right Ear: External ear normal.  Left Ear: External ear normal.  Nose: Nose normal.  Mouth/Throat: Oropharynx is clear and moist. No oropharyngeal exudate.  Eyes: Conjunctivae normal and EOM are normal. Pupils are equal, round, and reactive to light. Right eye exhibits no discharge. Left eye exhibits no discharge. No scleral icterus.  Neck: Normal range of motion. Neck supple. No JVD present. No tracheal deviation present. No thyromegaly present.  Cardiovascular: Normal rate, regular rhythm, normal heart sounds and intact distal pulses.  Exam reveals no gallop and no friction rub.   No murmur heard. Pulmonary/Chest: Effort normal and breath sounds normal. No stridor. No respiratory distress. He has no wheezes. He has no rales. He exhibits no tenderness.  Abdominal: Soft. Bowel sounds are normal. He exhibits no distension and no mass. There is no tenderness. There is no rebound and no guarding.  Genitourinary: Rectum normal, prostate normal and penis normal. Guaiac negative stool. No penile tenderness.  Musculoskeletal: Normal range of motion. He exhibits tenderness (LS spine). He exhibits no edema.  Lymphadenopathy:    He has no cervical adenopathy.  Neurological: He is alert and oriented to person, place, and time. He  has normal reflexes. No cranial nerve deficit. He exhibits normal muscle tone. Coordination normal.  Skin: Skin is warm and dry. No rash noted. He is not diaphoretic. No erythema. No pallor.  Psychiatric: He has a normal mood  and affect. His behavior is normal. Judgment and thought content normal.    Lab Results  Component Value Date   WBC 7.9 09/28/2012   HGB 12.6* 09/28/2012   HCT 37.5* 09/28/2012   PLT 257 09/28/2012   GLUCOSE 112* 09/28/2012   CHOL 124 02/18/2012   TRIG 75.0 02/18/2012   HDL 49.00 02/18/2012   LDLCALC 60 02/18/2012   ALT 13 09/28/2012   AST 22 09/28/2012   NA 139 09/28/2012   K 4.9 09/28/2012   CL 100 09/28/2012   CREATININE 1.47* 09/28/2012   BUN 20 09/28/2012   CO2 30 09/28/2012   TSH 1.63 02/18/2012   PSA 0.66 02/18/2012   INR 1.49 11/21/2009         Assessment & Plan:   A complex case

## 2012-10-08 NOTE — Assessment & Plan Note (Addendum)
surg cons is pending  He has mostly LBP sx's and he is not better w/Cipro. I'm not convinced with the dx of cholecystitis or diverticulosis.  I told him that he needs to see an orthopedist. He may need to be admitted for pain control and ortho w/up

## 2012-10-08 NOTE — Patient Instructions (Signed)
Please go to [facility] to be [facility] for pain control and back pain work up if not better or if worse

## 2012-10-08 NOTE — Assessment & Plan Note (Signed)
Rib belt °

## 2012-10-08 NOTE — Assessment & Plan Note (Addendum)
10/13 - he had a CT at [place] On Cipro - he is not feeling better. I'll add Flagyl He has mostly LBP sx's and he is not better w/Cipro. I'm not convinced with the dx of cholecystitis or diverticulosis.  I told him that he needs to see an orthopedist. He may need to be admitted for pain control and ortho w/up

## 2012-10-08 NOTE — Assessment & Plan Note (Signed)
Better  

## 2012-10-08 NOTE — Assessment & Plan Note (Signed)
Continue with current prescription therapy as reflected on the Med list.  

## 2012-10-08 NOTE — Assessment & Plan Note (Addendum)
Continue with current prescription therapy as reflected on the Med list. Dilaudid was refilled.   He has mostly LBP sx's and he is not better w/Cipro. I'm not convinced with the dx of cholecystitis or diverticulosis.  I told him that he needs to see an orthopedist. He may need to be admitted for pain control and ortho w/up

## 2012-10-11 ENCOUNTER — Other Ambulatory Visit: Payer: Self-pay

## 2012-10-14 ENCOUNTER — Other Ambulatory Visit: Payer: Medicare Other

## 2012-10-14 ENCOUNTER — Ambulatory Visit (INDEPENDENT_AMBULATORY_CARE_PROVIDER_SITE_OTHER): Payer: Medicare Other | Admitting: Surgery

## 2012-10-26 ENCOUNTER — Encounter (INDEPENDENT_AMBULATORY_CARE_PROVIDER_SITE_OTHER): Payer: Self-pay | Admitting: Surgery

## 2012-11-09 ENCOUNTER — Ambulatory Visit (INDEPENDENT_AMBULATORY_CARE_PROVIDER_SITE_OTHER): Payer: Medicare Other | Admitting: Internal Medicine

## 2012-11-09 ENCOUNTER — Encounter: Payer: Self-pay | Admitting: Internal Medicine

## 2012-11-09 VITALS — BP 118/66 | HR 84 | Temp 98.7°F | Resp 16 | Wt 172.0 lb

## 2012-11-09 DIAGNOSIS — M545 Low back pain, unspecified: Secondary | ICD-10-CM

## 2012-11-09 DIAGNOSIS — K5900 Constipation, unspecified: Secondary | ICD-10-CM

## 2012-11-09 DIAGNOSIS — I2510 Atherosclerotic heart disease of native coronary artery without angina pectoris: Secondary | ICD-10-CM

## 2012-11-09 DIAGNOSIS — I10 Essential (primary) hypertension: Secondary | ICD-10-CM

## 2012-11-09 MED ORDER — PREDNISONE 10 MG PO TABS: ORAL_TABLET | ORAL | Status: DC

## 2012-11-09 MED ORDER — DICLOFENAC EPOLAMINE 1.3 % TD PTCH: 1.0000 | MEDICATED_PATCH | Freq: Two times a day (BID) | TRANSDERMAL | Status: DC

## 2012-11-09 MED ORDER — HYDROMORPHONE HCL 4 MG PO TABS: 4.0000 mg | ORAL_TABLET | ORAL | Status: DC | PRN

## 2012-11-09 NOTE — Progress Notes (Signed)
Patient ID: Adam Weeks, male   DOB: October 22, 1927, 78 y.o.   MRN: [id number]   Subjective:    Patient ID: Adam Weeks, male    DOB: [birth year], 78 y.o.   MRN: [id number]  Back Pain This is a chronic (acute on chronic) problem. The current episode started more than 1 month ago. The problem occurs constantly. The problem has been gradually worsening since onset. The pain is present in the lumbar spine. The pain is at a severity of 9/10. The pain is severe. The symptoms are aggravated by bending and twisting. Pertinent negatives include no abdominal pain, chest pain or weakness. He has tried NSAIDs, walking, muscle relaxant, analgesics and bed rest for the symptoms. The treatment provided no relief.  Constipation This is a recurrent problem. The current episode started 1 to 4 weeks ago (3 wks). Associated symptoms include back pain. Pertinent negatives include no abdominal pain, diarrhea or nausea.  BMs are better  He took prednisone a fw months ago w/a fair relief in his LBP  BP Readings from Last 3 Encounters:  11/09/12 118/66  10/08/12 128/60  10/01/12 120/64   Wt Readings from Last 3 Encounters:  11/09/12 172 lb (78.019 kg)  10/08/12 173 lb (78.472 kg)  10/01/12 177 lb 12 oz (80.627 kg)     Review of Systems  Constitutional: Negative for appetite change, fatigue and unexpected weight change.  HENT: Negative for nosebleeds, congestion, sore throat, sneezing, trouble swallowing and neck pain.   Eyes: Negative for itching and visual disturbance.  Respiratory: Negative for cough.   Cardiovascular: Negative for chest pain, palpitations and leg swelling.  Gastrointestinal: Positive for constipation. Negative for nausea, abdominal pain, diarrhea, blood in stool and abdominal distention.  Genitourinary: Negative for frequency and hematuria.  Musculoskeletal: Positive for myalgias, back pain, arthralgias and gait problem. Negative for joint swelling.  Skin: Negative for rash.  Neurological:  Negative for dizziness, tremors, speech difficulty and weakness.  Psychiatric/Behavioral: Negative for suicidal ideas, confusion, sleep disturbance, dysphoric mood and agitation. The patient is not nervous/anxious.        Objective:   Physical Exam  Constitutional: He is oriented to person, place, and time. He appears well-developed and well-nourished. No distress.  HENT:  Head: Normocephalic and atraumatic.  Right Ear: External ear normal.  Left Ear: External ear normal.  Nose: Nose normal.  Mouth/Throat: Oropharynx is clear and moist. No oropharyngeal exudate.  Eyes: Conjunctivae normal and EOM are normal. Pupils are equal, round, and reactive to light. Right eye exhibits no discharge. Left eye exhibits no discharge. No scleral icterus.  Neck: Normal range of motion. Neck supple. No JVD present. No tracheal deviation present. No thyromegaly present.  Cardiovascular: Normal rate, regular rhythm, normal heart sounds and intact distal pulses.  Exam reveals no gallop and no friction rub.   No murmur heard. Pulmonary/Chest: Effort normal and breath sounds normal. No stridor. No respiratory distress. He has no wheezes. He has no rales. He exhibits no tenderness.  Abdominal: Soft. Bowel sounds are normal. He exhibits no distension and no mass. There is no tenderness. There is no rebound and no guarding.  Genitourinary: Rectum normal, prostate normal and penis normal. Guaiac negative stool. No penile tenderness.  Musculoskeletal: Normal range of motion. He exhibits tenderness (LS spine). He exhibits no edema.  Lymphadenopathy:    He has no cervical adenopathy.  Neurological: He is alert and oriented to person, place, and time. He has normal reflexes. No cranial nerve deficit. He exhibits normal  muscle tone. Coordination normal.  Skin: Skin is warm and dry. No rash noted. He is not diaphoretic. No erythema. No pallor.  Psychiatric: He has a normal mood and affect. His behavior is normal. Judgment  and thought content normal.    Lab Results  Component Value Date   WBC 7.9 09/28/2012   HGB 12.6* 09/28/2012   HCT 37.5* 09/28/2012   PLT 257 09/28/2012   GLUCOSE 112* 09/28/2012   CHOL 124 02/18/2012   TRIG 75.0 02/18/2012   HDL 49.00 02/18/2012   LDLCALC 60 02/18/2012   ALT 13 09/28/2012   AST 22 09/28/2012   NA 139 09/28/2012   K 4.9 09/28/2012   CL 100 09/28/2012   CREATININE 1.47* 09/28/2012   BUN 20 09/28/2012   CO2 30 09/28/2012   TSH 1.63 02/18/2012   PSA 0.66 02/18/2012   INR 1.49 11/21/2009         Assessment & Plan:   A complex case

## 2012-11-09 NOTE — Assessment & Plan Note (Signed)
Stable Continue with current prescription therapy as reflected on the Med list.  

## 2012-11-09 NOTE — Assessment & Plan Note (Signed)
Continue with current prescription therapy as reflected on the Med list.  

## 2012-11-09 NOTE — Assessment & Plan Note (Addendum)
He had an MRI at the [place] F/u at the [facility] is pending Trial of prednisone: risks discussed. Continue with current prescription therapy as reflected on the Med list.

## 2012-12-13 ENCOUNTER — Ambulatory Visit: Payer: Medicare Other | Admitting: Internal Medicine

## 2013-04-11 ENCOUNTER — Other Ambulatory Visit (INDEPENDENT_AMBULATORY_CARE_PROVIDER_SITE_OTHER): Payer: Medicare Other

## 2013-04-11 ENCOUNTER — Ambulatory Visit (INDEPENDENT_AMBULATORY_CARE_PROVIDER_SITE_OTHER): Payer: Medicare Other | Admitting: Internal Medicine

## 2013-04-11 ENCOUNTER — Encounter: Payer: Self-pay | Admitting: Internal Medicine

## 2013-04-11 VITALS — BP 114/58 | HR 68 | Temp 98.1°F | Ht 71.0 in | Wt 169.0 lb

## 2013-04-11 DIAGNOSIS — R5381 Other malaise: Secondary | ICD-10-CM

## 2013-04-11 DIAGNOSIS — M7989 Other specified soft tissue disorders: Secondary | ICD-10-CM

## 2013-04-11 DIAGNOSIS — L0291 Cutaneous abscess, unspecified: Secondary | ICD-10-CM

## 2013-04-11 DIAGNOSIS — L0390 Cellulitis, unspecified: Secondary | ICD-10-CM

## 2013-04-11 DIAGNOSIS — R5383 Other fatigue: Secondary | ICD-10-CM

## 2013-04-11 LAB — URIC ACID: Uric Acid, Serum: 9.4 mg/dL — ABNORMAL HIGH (ref 4.0–7.8)

## 2013-04-11 LAB — LIPID PANEL
HDL: 26.9 mg/dL — ABNORMAL LOW (ref >39.00)
LDL Cholesterol: 41 mg/dL (ref 0–99)
Total CHOL/HDL Ratio: 3
Triglycerides: 89 mg/dL (ref 0.0–149.0)
VLDL: 17.8 mg/dL (ref 0.0–40.0)

## 2013-04-11 LAB — BASIC METABOLIC PANEL
CO2: 29 mEq/L (ref 19–32)
Calcium: 8.5 mg/dL (ref 8.4–10.5)
Chloride: 104 mEq/L (ref 96–112)
Potassium: 4.5 mEq/L (ref 3.5–5.1)
Sodium: 138 mEq/L (ref 135–145)

## 2013-04-11 LAB — CBC
RBC: 3.07 Mil/uL — ABNORMAL LOW (ref 4.22–5.81)
RDW: 12 % (ref 11.5–14.6)
WBC: 7.7 10*3/uL (ref 4.5–10.5)

## 2013-04-11 MED ORDER — CEPHALEXIN 250 MG PO CAPS: 250.0000 mg | ORAL_CAPSULE | Freq: Four times a day (QID) | ORAL | Status: DC

## 2013-04-11 NOTE — Patient Instructions (Signed)
Cellulitis Cellulitis is an infection of the skin and the tissue beneath it. The infected area is usually red and tender. Cellulitis occurs most often in the arms and lower legs.   CAUSES   Cellulitis is caused by bacteria that enter the skin through cracks or cuts in the skin. The most common types of bacteria that cause cellulitis are Staphylococcus and Streptococcus. SYMPTOMS    Redness and warmth.   Swelling.   Tenderness or pain.   Fever.  DIAGNOSIS  Your caregiver can usually determine what is wrong based on a physical exam. Blood tests may also be done. TREATMENT   Treatment usually involves taking an antibiotic medicine. HOME CARE INSTRUCTIONS    Take your antibiotics as directed. Finish them even if you start to feel better.   Keep the infected arm or leg elevated to reduce swelling.   Apply a warm cloth to the affected area up to 4 times per day to relieve pain.   Only take over-the-counter or prescription medicines for pain, discomfort, or fever as directed by your caregiver.   Keep all follow-up appointments as directed by your caregiver.  SEEK MEDICAL CARE IF:    You notice red streaks coming from the infected area.   Your red area gets larger or turns dark in color.   Your bone or joint underneath the infected area becomes painful after the skin has healed.   Your infection returns in the same area or another area.   You notice a swollen bump in the infected area.   You develop new symptoms.  SEEK IMMEDIATE MEDICAL CARE IF:    You have a fever.   You feel very sleepy.   You develop vomiting or diarrhea.   You have a general ill feeling (malaise) with muscle aches and pains.  MAKE SURE YOU:    Understand these instructions.   Will watch your condition.   Will get help right away if you are not doing well or get worse.  Document Released: 09/24/2005 Document Revised: 06/15/2012 Document Reviewed: 03/01/2012 [employer] Patient Information 2013  [employer]    

## 2013-04-11 NOTE — Progress Notes (Signed)
Subjective:    Patient ID: Adam Weeks, male    DOB: [birth year], 79 y.o.   MRN: [id number]  HPI  Pt presents to the clinic today with c/o a spider bite on his left foot. This occurred 3 days ago. He felt something crawling in his bed, he pulled the covers back and he saw a black spider crawling around in the sheets. The next day, his left big toe started swelling and turning red. It is tender to touch. He has never had gout before. He has not put anything on the area. He denies fever, chills or body aches.  Review of Systems      Past Medical History  Diagnosis Date  . Osteoporosis   . GERD (gastroesophageal reflux disease)   . Hyperlipidemia   . Hypertension   . CAD (coronary artery disease)   . Chronic kidney disease     stones    Current Outpatient Prescriptions  Medication Sig Dispense Refill  . alendronate (FOSAMAX) 70 MG tablet Take 70 mg by mouth every 7 (seven) days. Take with a full glass of water on an empty stomach.       [name] amLODipine (NORVASC) 10 MG tablet Take 10 mg by mouth daily.       . carvedilol (COREG) 12.5 MG tablet Take 12.5 mg by mouth 2 (two) times daily with a meal.      . Cholecalciferol 1000 UNITS tablet Take 1,000 Units by mouth daily.        . clonazePAM (KLONOPIN) 1 MG tablet Take 1 mg by mouth at bedtime as needed. For leg cramps or sleep      . diclofenac (FLECTOR) 1.3 % PTCH Place 1 patch onto the skin 2 (two) times daily.  60 patch  3  . folic acid (FOLVITE) 1 MG tablet Take 2 mg by mouth daily.        . furosemide (LASIX) 40 MG tablet Take 40 mg by mouth daily.        [name] HYDROmorphone (DILAUDID) 4 MG tablet Take 1-2 tablets (4-8 mg total) by mouth every 4 (four) hours as needed for pain.  100 tablet  0  . loratadine (CLARITIN) 10 MG tablet Take 10 mg by mouth daily.        . Multiple Vitamins-Minerals (OCUVITE PRESERVISION) TABS Take 2 tablets by mouth 2 (two) times daily.        . Omega-3 Fatty Acids (FISH OIL) 1000 MG CAPS Take 2 capsules by  mouth 2 (two) times daily.       [name] omeprazole (PRILOSEC) 20 MG capsule Take 20 mg by mouth daily.        . Probiotic Product (ALIGN) 4 MG CAPS Take 1 capsule by mouth daily.  30 capsule  1  . rosuvastatin (CRESTOR) 20 MG tablet Take 10 mg by mouth daily.       . sennosides-docusate sodium (SENOKOT-S) 8.6-50 MG tablet Take 2 tablets by mouth 2 (two) times daily.       [name] venlafaxine (EFFEXOR) 75 MG tablet Take 75 mg by mouth daily.      . Linaclotide 290 MCG CAPS Take 1 capsule by mouth daily.  30 capsule  11   No current facility-administered medications for this visit.    No Known Allergies  Family History  Problem Relation Age of Onset  . Cancer Mother 64    breast ca  . Heart disease Father     CAD    History  Social History  . Marital Status: Divorced    Spouse Name: N/A    Number of Children: N/A  . Years of Education: N/A   Occupational History  . Not on file.   Social History Main Topics  . Smoking status: Former Games developer  . Smokeless tobacco: Not on file  . Alcohol Use: No  . Drug Use: No  . Sexually Active: Not Currently   Other Topics Concern  . Not on file   Social History Narrative  . No narrative on file     Constitutional: Denies fever, malaise, fatigue, headache or abrupt weight changes.  Musculoskeletal: Denies decrease in range of motion, difficulty with gait, muscle pain or joint pain and swelling.  Skin: Pt reports redness and swelling of left big toe. Denies redness, rashes, lesions or ulcercations.   No other specific complaints in a complete review of systems (except as listed in HPI above).  Objective:   Physical Exam   BP 114/58  Pulse 68  Temp(Src) 98.1 F (36.7 C) (Oral)  Ht 5\' 11"  (1.803 m)  Wt 169 lb (76.658 kg)  BMI 23.58 kg/m2  SpO2 95% Wt Readings from Last 3 Encounters:  04/11/13 169 lb (76.658 kg)  11/09/12 172 lb (78.019 kg)  10/08/12 173 lb (78.472 kg)    General: Appears his stated age, well developed, well  nourished in NAD. Skin: Cellulitis noted of left big toe. Small puncture site on dorsal portion of big toe. Cardiovascular: Normal rate and rhythm. S1,S2 noted.  No murmur, rubs or gallops noted. No JVD or BLE edema. No carotid bruits noted. Pulmonary/Chest: Normal effort and positive vesicular breath sounds. No respiratory distress. No wheezes, rales or ronchi noted.  Musculoskeletal: Normal range of motion. No signs of joint swelling. No difficulty with gait.     Assessment & Plan:   Cellulitis, secondary to presumed spider bite:  Will treat cellulitis with Keflex QID x 7 days Will obtain uric acid level just to make sure it's not gout, but based on exam I do not think it is

## 2013-04-12 ENCOUNTER — Other Ambulatory Visit: Payer: Self-pay | Admitting: Internal Medicine

## 2013-04-12 MED ORDER — COLCHICINE 0.6 MG PO TABS: 0.6000 mg | ORAL_TABLET | Freq: Two times a day (BID) | ORAL | Status: DC

## 2013-04-21 ENCOUNTER — Telehealth: Payer: Self-pay | Admitting: Internal Medicine

## 2013-04-21 NOTE — Telephone Encounter (Signed)
Caller: Alexio/Patient; Phone: [phone]; Reason for Call: Please have [name] to call patient.  He has a question concerning office visit 1-2 weeks ago where he was diagnosed with gout.  He states that his left foot and ankle are still swollen and requesting her advisement of his situation.

## 2013-04-22 NOTE — Telephone Encounter (Signed)
Ondreasa, Can you call him and ask him if he used the colchinie that I called in for him. Also is it still painful or just swollen and red. Did he finish the antibiotics I gave him or did we stop them when the uric acid level came back?  Rene Kocher

## 2013-04-22 NOTE — Telephone Encounter (Signed)
Phone call to pt stating he did finish the Col[place]icine on the 20 th. His left foot/ankle is still swollen and achy. He did stop the antibiotics. Please advise.

## 2013-04-22 NOTE — Telephone Encounter (Signed)
Transferred to reception to schedule an appt

## 2013-04-22 NOTE — Telephone Encounter (Signed)
I would recommend a follow up OV. If we dont have any openings today, we are open for Saturday clinic tommorow [place]

## 2013-04-23 ENCOUNTER — Encounter: Payer: Self-pay | Admitting: Internal Medicine

## 2013-04-23 ENCOUNTER — Ambulatory Visit (INDEPENDENT_AMBULATORY_CARE_PROVIDER_SITE_OTHER): Payer: Medicare Other | Admitting: Internal Medicine

## 2013-04-23 VITALS — BP 98/60 | HR 100 | Temp 97.5°F | Wt 166.0 lb

## 2013-04-23 DIAGNOSIS — R7989 Other specified abnormal findings of blood chemistry: Secondary | ICD-10-CM

## 2013-04-23 DIAGNOSIS — M7989 Other specified soft tissue disorders: Secondary | ICD-10-CM

## 2013-04-23 DIAGNOSIS — D649 Anemia, unspecified: Secondary | ICD-10-CM

## 2013-04-23 DIAGNOSIS — E790 Hyperuricemia without signs of inflammatory arthritis and tophaceous disease: Secondary | ICD-10-CM

## 2013-04-23 DIAGNOSIS — N289 Disorder of kidney and ureter, unspecified: Secondary | ICD-10-CM

## 2013-04-23 DIAGNOSIS — I2510 Atherosclerotic heart disease of native coronary artery without angina pectoris: Secondary | ICD-10-CM

## 2013-04-23 MED ORDER — COLCHICINE 0.6 MG PO TABS: 0.6000 mg | ORAL_TABLET | Freq: Two times a day (BID) | ORAL | Status: DC

## 2013-04-23 NOTE — Patient Instructions (Addendum)
This could be gout  But should stay on   Colchicine to suppress inflammation.    doesnt  look like  Infection today .   Stay on iron  As your were anemic on the lab results.   Advise x ray foot to r/o other problems  And FU visit with  Your PCP  To see if you need other intervention.   Your blood pressure is borderline  Low poss from medications.   Gout Gout is an inflammatory condition (arthritis) caused by a buildup of uric acid crystals in the joints. Uric acid is a chemical that is normally present in the blood. Under some circumstances, uric acid can form into crystals in your joints. This causes joint redness, soreness, and swelling (inflammation). Repeat attacks are common. Over time, uric acid crystals can form into masses (tophi) near a joint, causing disfigurement. Gout is treatable and often preventable. CAUSES  The disease begins with elevated levels of uric acid in the blood. Uric acid is produced by your body when it breaks down a naturally found substance called purines. This also happens when you eat certain foods such as meats and fish. Causes of an elevated uric acid level include:  Being passed down from parent to child (heredity).  Diseases that cause increased uric acid production (obesity, psoriasis, some cancers).  Excessive alcohol use.  Diet, especially diets rich in meat and seafood.  Medicines, including certain cancer-fighting drugs (chemotherapy), diuretics, and aspirin.  Chronic kidney disease. The kidneys are no longer able to remove uric acid well.  Problems with metabolism. Conditions strongly associated with gout include:  Obesity.  High blood pressure.  High cholesterol.  Diabetes. Not everyone with elevated uric acid levels gets gout. It is not understood why some people get gout and others do not. Surgery, joint injury, and eating too much of certain foods are some of the factors that can lead to gout. SYMPTOMS   An attack of gout comes on  quickly. It causes intense pain with redness, swelling, and warmth in a joint.  Fever can occur.  Often, only one joint is involved. Certain joints are more commonly involved:  Base of the big toe.  Knee.  Ankle.  Wrist.  Finger. Without treatment, an attack usually goes away in a few days to weeks. Between attacks, you usually will not have symptoms, which is different from many other forms of arthritis. DIAGNOSIS  Your caregiver will suspect gout based on your symptoms and exam. Removal of fluid from the joint (arthrocentesis) is done to check for uric acid crystals. Your caregiver will give you a medicine that numbs the area (local anesthetic) and use a needle to remove joint fluid for exam. Gout is confirmed when uric acid crystals are seen in joint fluid, using a special microscope. Sometimes, blood, urine, and X-ray tests are also used. TREATMENT  There are 2 phases to gout treatment: treating the sudden onset (acute) attack and preventing attacks (prophylaxis). Treatment of an Acute Attack  Medicines are used. These include anti-inflammatory medicines or steroid medicines.  An injection of steroid medicine into the affected joint is sometimes necessary.  The painful joint is rested. Movement can worsen the arthritis.  You may use warm or cold treatments on painful joints, depending which works best for you.  Discuss the use of coffee, vitamin C, or cherries with your caregiver. These may be helpful treatment options. Treatment to Prevent Attacks After the acute attack subsides, your caregiver may advise prophylactic medicine. These  medicines either help your kidneys eliminate uric acid from your body or decrease your uric acid production. You may need to stay on these medicines for a very long time. The early phase of treatment with prophylactic medicine can be associated with an increase in acute gout attacks. For this reason, during the first few months of treatment, your  caregiver may also advise you to take medicines usually used for acute gout treatment. Be sure you understand your caregiver's directions. You should also discuss dietary treatment with your caregiver. Certain foods such as meats and fish can increase uric acid levels. Other foods such as dairy can decrease levels. Your caregiver can give you a list of foods to avoid. HOME CARE INSTRUCTIONS   Do not take aspirin to relieve pain. This raises uric acid levels.  Only take over-the-counter or prescription medicines for pain, discomfort, or fever as directed by your caregiver.  Rest the joint as much as possible. When in bed, keep sheets and blankets off painful areas.  Keep the affected joint raised (elevated).  Use crutches if the painful joint is in your leg.  Drink enough water and fluids to keep your urine clear or pale yellow. This helps your body get rid of uric acid. Do not drink alcoholic beverages. They slow the passage of uric acid.  Follow your caregiver's dietary instructions. Pay careful attention to the amount of protein you eat. Your daily diet should emphasize fruits, vegetables, whole grains, and fat-free or low-fat milk products.  Maintain a healthy body weight. SEEK MEDICAL CARE IF:   You have an oral temperature above 102 F (38.9 C).  You develop diarrhea, vomiting, or any side effects from medicines.  You do not feel better in 24 hours, or you are getting worse. SEEK IMMEDIATE MEDICAL CARE IF:   Your joint becomes suddenly more tender and you have:  Chills.  An oral temperature above 102 F (38.9 C), not controlled by medicine. MAKE SURE YOU:   Understand these instructions.  Will watch your condition.  Will get help right away if you are not doing well or get worse. Document Released: 12/12/2000 Document Revised: 03/08/2012 Document Reviewed: 03/25/2010 [facility] Patient Information 2013 [place], [place].

## 2013-04-23 NOTE — Progress Notes (Signed)
Chief Complaint  Patient presents with  . Foot Pain    left    HPI: Patient comes in today for SDA for   problem evaluation. Sat clinic.  Actually seen on clininc for left foot swelling and rx for cellulitis with one day iof keflex   After UA obtained told to take colchicine for 5 days 10 pills   finished a week ago  After 5 days .  Helped some but waxing and waning . nwver hurt that badly and no injury noted .[name] No fever warmth otherwise . No hx of gout   ? If fatigue or depression on meds getting better  asking to have foot check some better today  Goes to [place]    For most care but established in [place] for other issues  recently put on lisinopril for heart 5 mg no dizziness but does feel tired .denies   Bleeding gi bruising a lot falling  ROS: See pertinent positives and negatives per HPI. No fever syncope  On many medications   Past Medical History  Diagnosis Date  . Osteoporosis   . GERD (gastroesophageal reflux disease)   . Hyperlipidemia   . Hypertension   . CAD (coronary artery disease)   . Chronic kidney disease     stones    Family History  Problem Relation Age of Onset  . Cancer Mother 64    breast ca  . Heart disease Father     CAD    History   Social History  . Marital Status: Divorced    Spouse Name: N/A    Number of Children: N/A  . Years of Education: N/A   Social History Main Topics  . Smoking status: Former Games developer  . Smokeless tobacco: None  . Alcohol Use: No  . Drug Use: No  . Sexually Active: Not Currently   Other Topics Concern  . None   Social History Narrative  . None    Outpatient Encounter Prescriptions as of 04/23/2013  Medication Sig Dispense Refill  . alendronate (FOSAMAX) 70 MG tablet Take 70 mg by mouth every 7 (seven) days. Take with a full glass of water on an empty stomach.       [name] amLODipine (NORVASC) 10 MG tablet Take 10 mg by mouth daily.       . carvedilol (COREG) 12.5 MG tablet Take 12.5 mg by mouth 2 (two) times daily with a  meal.      . Cholecalciferol 1000 UNITS tablet Take 1,000 Units by mouth daily.        . clonazePAM (KLONOPIN) 1 MG tablet Take 1 mg by mouth at bedtime as needed. For leg cramps or sleep      . colchicine 0.6 MG tablet Take 1 tablet (0.6 mg total) by mouth 2 (two) times daily. After one week then decrease to 1 per day  60 tablet  0  . folic acid (FOLVITE) 1 MG tablet Take 2 mg by mouth daily.        . furosemide (LASIX) 40 MG tablet Take 40 mg by mouth daily.        [name] HYDROmorphone (DILAUDID) 4 MG tablet Take 1-2 tablets (4-8 mg total) by mouth every 4 (four) hours as needed for pain.  100 tablet  0  . Linaclotide 290 MCG CAPS Take 1 capsule by mouth daily.  30 capsule  11  . loratadine (CLARITIN) 10 MG tablet Take 10 mg by mouth daily.        [name]  Multiple Vitamins-Minerals (OCUVITE PRESERVISION) TABS Take 2 tablets by mouth 2 (two) times daily.        . Omega-3 Fatty Acids (FISH OIL) 1000 MG CAPS Take 2 capsules by mouth 2 (two) times daily.       [name] omeprazole (PRILOSEC) 20 MG capsule Take 20 mg by mouth daily.        . Probiotic Product (ALIGN) 4 MG CAPS Take 1 capsule by mouth daily.  30 capsule  1  . rosuvastatin (CRESTOR) 20 MG tablet Take 10 mg by mouth daily.       . sennosides-docusate sodium (SENOKOT-S) 8.6-50 MG tablet Take 2 tablets by mouth 2 (two) times daily.       [name] venlafaxine (EFFEXOR) 75 MG tablet Take 75 mg by mouth daily.      . [DISCONTINUED] colchicine 0.6 MG tablet Take 1 tablet (0.6 mg total) by mouth 2 (two) times daily.  10 tablet  0  . [DISCONTINUED] diclofenac (FLECTOR) 1.3 % PTCH Place 1 patch onto the skin 2 (two) times daily.  60 patch  3   No facility-administered encounter medications on file as of 04/23/2013.    EXAM:  BP 98/60  Pulse 100  Temp(Src) 97.5 F (36.4 C) (Oral)  Wt 166 lb (75.297 kg)  BMI 23.16 kg/m2  SpO2 90%  Body mass index is 23.16 kg/(m^2).  GENERAL: vitals reviewed and listed above, alert, oriented, appears well hydrated and in no  acute distress looks some tired no dypnea quiet respirations   HEENT: atraumatic, conjunctiva  clear, no obvious abnormalities on inspection of external nose and ears   NECK: no obvious masses on inspection palpation   LUNGS: clear to auscultation bilaterally, no wheezes, rales or rhonchi, good air movement  CV: HRRR, no clubbing cyanosis or  peripheral edema nl cap refill  Recheck bp 100/60  MS: moves all extremities without noticeable focal  Abnormality left foot  Pulse present   Faint erythema and swelling at great toe and proximal second toe with slight swelling over top of foot  No bruising or warmth    PSYCH: pleasant and cooperative, no obvious depression or anxiety cognition appears intact  reviewed last lab with pt  UC 9.7  Hg was 9.7  Cr 1.7   Lab Results  Component Value Date   WBC 7.7 04/11/2013   HGB 9.7* 04/11/2013   HCT 28.8* 04/11/2013   PLT 150.0 04/11/2013   GLUCOSE 107* 04/11/2013   CHOL 86 04/11/2013   TRIG 89.0 04/11/2013   HDL 26.90* 04/11/2013   LDLCALC 41 04/11/2013   ALT 13 09/28/2012   AST 22 09/28/2012   NA 138 04/11/2013   K 4.5 04/11/2013   CL 104 04/11/2013   CREATININE 1.7* 04/11/2013   BUN 39* 04/11/2013   CO2 29 04/11/2013   TSH 1.63 02/18/2012   PSA 0.66 02/18/2012   INR 1.49 11/21/2009    ASSESSMENT AND PLAN:  Discussed the following assessment and plan:  Foot swelling - someimproved  uncertain cause presumed gout  - Plan: DG Foot Complete Left  CAD (coronary artery disease)  Anemia - uncertain significant  noted on last labs  o known bleeding gets care at [facility] also   Renal insufficiency - cr 1.7   Hyperuricemia To get x ray and fu with PCP  More important are his other issues such as anemia borderline bp [facility]  Lisinopril and definition of which providers are managing with problem  .  For now restart colchicine with warning about poss interactino with crestor   And continue until sees pcp.  He says he knows nothing about gout so ho  given . ?s on return   His anemia could contribute to fatigue  Uncertain what baseline is   Had 12.6   6 month ago  But low 3 years ago  -Patient advised to return or notify health care team  if symptoms worsen or persist or new concerns arise.  Patient Instructions  This could be gout  But should stay on   Colchicine to suppress inflammation.    doesnt  look like  Infection today .   Stay on iron  As your were anemic on the lab results.   Advise x ray foot to r/o other problems  And FU visit with  Your PCP  To see if you need other intervention.   Your blood pressure is borderline  Low poss from medications.   Gout Gout is an inflammatory condition (arthritis) caused by a buildup of uric acid crystals in the joints. Uric acid is a chemical that is normally present in the blood. Under some circumstances, uric acid can form into crystals in your joints. This causes joint redness, soreness, and swelling (inflammation). Repeat attacks are common. Over time, uric acid crystals can form into masses (tophi) near a joint, causing disfigurement. Gout is treatable and often preventable. CAUSES  The disease begins with elevated levels of uric acid in the blood. Uric acid is produced by your body when it breaks down a naturally found substance called purines. This also happens when you eat certain foods such as meats and fish. Causes of an elevated uric acid level include:  Being passed down from parent to child (heredity).  Diseases that cause increased uric acid production (obesity, psoriasis, some cancers).  Excessive alcohol use.  Diet, especially diets rich in meat and seafood.  Medicines, including certain cancer-fighting drugs (chemotherapy), diuretics, and aspirin.  Chronic kidney disease. The kidneys are no longer able to remove uric acid well.  Problems with metabolism. Conditions strongly associated with gout include:  Obesity.  High blood pressure.  High  cholesterol.  Diabetes. Not everyone with elevated uric acid levels gets gout. It is not understood why some people get gout and others do not. Surgery, joint injury, and eating too much of certain foods are some of the factors that can lead to gout. SYMPTOMS   An attack of gout comes on quickly. It causes intense pain with redness, swelling, and warmth in a joint.  Fever can occur.  Often, only one joint is involved. Certain joints are more commonly involved:  Base of the big toe.  Knee.  Ankle.  Wrist.  Finger. Without treatment, an attack usually goes away in a few days to weeks. Between attacks, you usually will not have symptoms, which is different from many other forms of arthritis. DIAGNOSIS  Your caregiver will suspect gout based on your symptoms and exam. Removal of fluid from the joint (arthrocentesis) is done to check for uric acid crystals. Your caregiver will give you a medicine that numbs the area (local anesthetic) and use a needle to remove joint fluid for exam. Gout is confirmed when uric acid crystals are seen in joint fluid, using a special microscope. Sometimes, blood, urine, and X-ray tests are also used. TREATMENT  There are 2 phases to gout treatment: treating the sudden onset (acute) attack and preventing attacks (prophylaxis). Treatment of an Acute Attack  Medicines are used. These include anti-inflammatory medicines or steroid medicines.  An injection of steroid medicine into the affected joint is sometimes necessary.  The painful joint is rested. Movement can worsen the arthritis.  You may use warm or cold treatments on painful joints, depending which works best for you.  Discuss the use of coffee, vitamin C, or cherries with your caregiver. These may be helpful treatment options. Treatment to Prevent Attacks After the acute attack subsides, your caregiver may advise prophylactic medicine. These medicines either help your kidneys eliminate uric acid  from your body or decrease your uric acid production. You may need to stay on these medicines for a very long time. The early phase of treatment with prophylactic medicine can be associated with an increase in acute gout attacks. For this reason, during the first few months of treatment, your caregiver may also advise you to take medicines usually used for acute gout treatment. Be sure you understand your caregiver's directions. You should also discuss dietary treatment with your caregiver. Certain foods such as meats and fish can increase uric acid levels. Other foods such as dairy can decrease levels. Your caregiver can give you a list of foods to avoid. HOME CARE INSTRUCTIONS   Do not take aspirin to relieve pain. This raises uric acid levels.  Only take over-the-counter or prescription medicines for pain, discomfort, or fever as directed by your caregiver.  Rest the joint as much as possible. When in bed, keep sheets and blankets off painful areas.  Keep the affected joint raised (elevated).  Use crutches if the painful joint is in your leg.  Drink enough water and fluids to keep your urine clear or pale yellow. This helps your body get rid of uric acid. Do not drink alcoholic beverages. They slow the passage of uric acid.  Follow your caregiver's dietary instructions. Pay careful attention to the amount of protein you eat. Your daily diet should emphasize fruits, vegetables, whole grains, and fat-free or low-fat milk products.  Maintain a healthy body weight. SEEK MEDICAL CARE IF:   You have an oral temperature above 102 F (38.9 C).  You develop diarrhea, vomiting, or any side effects from medicines.  You do not feel better in 24 hours, or you are getting worse. SEEK IMMEDIATE MEDICAL CARE IF:   Your joint becomes suddenly more tender and you have:  Chills.  An oral temperature above 102 F (38.9 C), not controlled by medicine. MAKE SURE YOU:   Understand these  instructions.  Will watch your condition.  Will get help right away if you are not doing well or get worse. Document Released: 12/12/2000 Document Revised: 03/08/2012 Document Reviewed: 03/25/2010 [facility] Patient Information 2013 [place], [place].      Neta Mends. Mayte Diers M.D.

## 2013-04-26 ENCOUNTER — Ambulatory Visit (INDEPENDENT_AMBULATORY_CARE_PROVIDER_SITE_OTHER): Payer: Medicare Other | Admitting: Internal Medicine

## 2013-04-26 ENCOUNTER — Encounter: Payer: Self-pay | Admitting: Internal Medicine

## 2013-04-26 VITALS — BP 132/62 | HR 69 | Temp 98.0°F | Ht 71.0 in | Wt 167.0 lb

## 2013-04-26 DIAGNOSIS — T50905A Adverse effect of unspecified drugs, medicaments and biological substances, initial encounter: Secondary | ICD-10-CM

## 2013-04-26 DIAGNOSIS — M7989 Other specified soft tissue disorders: Secondary | ICD-10-CM

## 2013-04-26 DIAGNOSIS — T887XXA Unspecified adverse effect of drug or medicament, initial encounter: Secondary | ICD-10-CM

## 2013-04-26 NOTE — Progress Notes (Signed)
Subjective:    Patient ID: Adam Weeks, male    DOB: Feb 26, 1927, 79 y.o.   MRN: [id number]  HPI  Pt presents to the clinic today to f/u left foot swelling. This started a few weeks ago. He originally thought that he was bitten by a spider, and was treated for 1 day of antibiotics for cellulitis. His uric acid level came back elevated so he was told to stop the antibiotic and start taking colchicine. The cochicine did seem to help the swelling some. He was evaluated for the same 3 days ago. No changes were made. He was told to continue the colchicine. An xray of the left foot was ordered but he has not had it done yet. The swelling has increased and spread up the the shin bone. He has no tenderness in the left leg. It is no longer red but the swelling has increased. Of note, he did start taking his Gabapentin 1 week prior to the onset of these symptoms.   Review of Systems      Past Medical History  Diagnosis Date  . Osteoporosis   . GERD (gastroesophageal reflux disease)   . Hyperlipidemia   . Hypertension   . CAD (coronary artery disease)   . Chronic kidney disease     stones    Current Outpatient Prescriptions  Medication Sig Dispense Refill  . alendronate (FOSAMAX) 70 MG tablet Take 70 mg by mouth every 7 (seven) days. Take with a full glass of water on an empty stomach.       [name] amLODipine (NORVASC) 10 MG tablet Take 10 mg by mouth daily.       . carvedilol (COREG) 12.5 MG tablet Take 12.5 mg by mouth 2 (two) times daily with a meal.      . Cholecalciferol 1000 UNITS tablet Take 1,000 Units by mouth daily.        . clonazePAM (KLONOPIN) 1 MG tablet Take 1 mg by mouth at bedtime as needed. For leg cramps or sleep      . colchicine 0.6 MG tablet Take 1 tablet (0.6 mg total) by mouth 2 (two) times daily. After one week then decrease to 1 per day  60 tablet  0  . folic acid (FOLVITE) 1 MG tablet Take 2 mg by mouth daily.        . furosemide (LASIX) 40 MG tablet Take 40 mg by mouth  daily.        [name] HYDROmorphone (DILAUDID) 4 MG tablet Take 1-2 tablets (4-8 mg total) by mouth every 4 (four) hours as needed for pain.  100 tablet  0  . Linaclotide 290 MCG CAPS Take 1 capsule by mouth daily.  30 capsule  11  . loratadine (CLARITIN) 10 MG tablet Take 10 mg by mouth daily.        . Multiple Vitamins-Minerals (OCUVITE PRESERVISION) TABS Take 2 tablets by mouth 2 (two) times daily.        . Omega-3 Fatty Acids (FISH OIL) 1000 MG CAPS Take 2 capsules by mouth 2 (two) times daily.       [name] omeprazole (PRILOSEC) 20 MG capsule Take 20 mg by mouth daily.        . Probiotic Product (ALIGN) 4 MG CAPS Take 1 capsule by mouth daily.  30 capsule  1  . rosuvastatin (CRESTOR) 20 MG tablet Take 10 mg by mouth daily.       . sennosides-docusate sodium (SENOKOT-S) 8.6-50 MG tablet Take 2 tablets by  mouth 2 (two) times daily.       [name] venlafaxine (EFFEXOR) 75 MG tablet Take 75 mg by mouth daily.       No current facility-administered medications for this visit.    No Known Allergies  Family History  Problem Relation Age of Onset  . Cancer Mother 79    breast ca  . Heart disease Father     CAD    History   Social History  . Marital Status: Divorced    Spouse Name: N/A    Number of Children: N/A  . Years of Education: N/A   Occupational History  . Not on file.   Social History Main Topics  . Smoking status: Former Games developer  . Smokeless tobacco: Not on file  . Alcohol Use: No  . Drug Use: No  . Sexually Active: Not Currently   Other Topics Concern  . Not on file   Social History Narrative  . No narrative on file     Constitutional: Denies fever, malaise, fatigue, headache or abrupt weight changes.  Musculoskeletal: Denies decrease in range of motion, difficulty with gait, muscle pain or joint pain and swelling.  Skin: Pt reports swelling of left foot. Denies rashes, lesions or ulcercations.    No other specific complaints in a complete review of systems (except as  listed in HPI above).  Objective:   Physical Exam   BP 132/62  Pulse 69  Temp(Src) 98 F (36.7 C) (Oral)  Ht 5\' 11"  (1.803 m)  Wt 167 lb (75.751 kg)  BMI 23.3 kg/m2  SpO2 94% Wt Readings from Last 3 Encounters:  04/26/13 167 lb (75.751 kg)  04/23/13 166 lb (75.297 kg)  04/11/13 169 lb (76.658 kg)    General: Appears his stated age, well developed, well nourished in NAD. Skin: Warm, dry and intact. No rashes, lesions or ulcerations noted. NO evidence of gout or cellulitis. Cardiovascular: Normal rate and rhythm. S1,S2 noted.  No murmur, rubs or gallops noted. No JVD. 2+ pretibial and left ankle/foot swelling. No carotid bruits noted. Pulmonary/Chest: Normal effort and positive vesicular breath sounds. No respiratory distress. No wheezes, rales or ronchi noted.  Musculoskeletal: Normal range of motion. No signs of joint swelling. No difficulty with gait.        Assessment & Plan:   Left leg swelling, new onset:  Likely due to side effect from Gabapentin Encouraged pt to elevate leg above heart whenever possible Pt did recently have a heart Echo at [facility], will send for records. No concerns for DVT at this time Pt will continue lasix but has decided he will stop the gabapentin to see if the swelling improves  RTC as needed or if symptoms persist

## 2013-04-26 NOTE — Patient Instructions (Signed)
Edema Edema is an abnormal build-up of fluids in tissues. Because this is partly dependent on gravity (water flows to the lowest place), it is more common in the legs and thighs (lower extremities). It is also common in the looser tissues, like around the eyes. Painless swelling of the feet and ankles is common and increases as a person ages. It may affect both legs and may include the calves or even thighs. When squeezed, the fluid may move out of the affected area and may leave a dent for a few moments. CAUSES   Prolonged standing or sitting in one place for extended periods of time. Movement helps pump tissue fluid into the veins, and absence of movement prevents this, resulting in edema.  Varicose veins. The valves in the veins do not work as well as they should. This causes fluid to leak into the tissues.  Fluid and salt overload.  Injury, burn, or surgery to the leg, ankle, or foot, may damage veins and allow fluid to leak out.  Sunburn damages vessels. Leaky vessels allow fluid to go out into the sunburned tissues.  Allergies (from insect bites or stings, medications or chemicals) cause swelling by allowing vessels to become leaky.  Protein in the blood helps keep fluid in your vessels. Low protein, as in malnutrition, allows fluid to leak out.  Hormonal changes, including pregnancy and menstruation, cause fluid retention. This fluid may leak out of vessels and cause edema.  Medications that cause fluid retention. Examples are sex hormones, blood pressure medications, steroid treatment, or anti-depressants.  Some illnesses cause edema, especially heart failure, kidney disease, or liver disease.  Surgery that cuts veins or lymph nodes, such as surgery done for the heart or for breast cancer, may result in edema. DIAGNOSIS  Your caregiver is usually easily able to determine what is causing your swelling (edema) by simply asking what is wrong (getting a history) and examining you (doing  a physical). Sometimes x-rays, EKG (electrocardiogram or heart tracing), and blood work may be done to evaluate for underlying medical illness. TREATMENT  General treatment includes:  Leg elevation (or elevation of the affected body part).  Restriction of fluid intake.  Prevention of fluid overload.  Compression of the affected body part. Compression with elastic bandages or support stockings squeezes the tissues, preventing fluid from entering and forcing it back into the blood vessels.  Diuretics (also called water pills or fluid pills) pull fluid out of your body in the form of increased urination. These are effective in reducing the swelling, but can have side effects and must be used only under your caregiver's supervision. Diuretics are appropriate only for some types of edema. The specific treatment can be directed at any underlying causes discovered. Heart, liver, or kidney disease should be treated appropriately. HOME CARE INSTRUCTIONS   Elevate the legs (or affected body part) above the level of the heart, while lying down.  Avoid sitting or standing still for prolonged periods of time.  Avoid putting anything directly under the knees when lying down, and do not wear constricting clothing or garters on the upper legs.  Exercising the legs causes the fluid to work back into the veins and lymphatic channels. This may help the swelling go down.  The pressure applied by elastic bandages or support stockings can help reduce ankle swelling.  A low-salt diet may help reduce fluid retention and decrease the ankle swelling.  Take any medications exactly as prescribed. SEEK MEDICAL CARE IF:  Your edema is   not responding to recommended treatments. SEEK IMMEDIATE MEDICAL CARE IF:   You develop shortness of breath or chest pain.  You cannot breathe when you lay down; or if, while lying down, you have to get up and go to the window to get your breath.  You are having increasing  swelling without relief from treatment.  You develop a fever over 102 F (38.9 C).  You develop pain or redness in the areas that are swollen.  Tell your caregiver right away if you have gained 3 lb/1.4 kg in 1 day or 5 lb/2.3 kg in a week. MAKE SURE YOU:   Understand these instructions.  Will watch your condition.  Will get help right away if you are not doing well or get worse. Document Released: 12/15/2005 Document Revised: 06/15/2012 Document Reviewed: 08/02/2008 [employer] Patient Information 2013 [employer].  

## 2013-09-05 ENCOUNTER — Other Ambulatory Visit (INDEPENDENT_AMBULATORY_CARE_PROVIDER_SITE_OTHER): Payer: Medicare Other

## 2013-09-05 ENCOUNTER — Ambulatory Visit (INDEPENDENT_AMBULATORY_CARE_PROVIDER_SITE_OTHER): Payer: Medicare Other | Admitting: Internal Medicine

## 2013-09-05 ENCOUNTER — Encounter: Payer: Self-pay | Admitting: Internal Medicine

## 2013-09-05 VITALS — BP 121/70 | HR 77 | Temp 98.7°F | Wt 157.0 lb

## 2013-09-05 DIAGNOSIS — M545 Low back pain, unspecified: Secondary | ICD-10-CM

## 2013-09-05 DIAGNOSIS — I2510 Atherosclerotic heart disease of native coronary artery without angina pectoris: Secondary | ICD-10-CM

## 2013-09-05 DIAGNOSIS — I10 Essential (primary) hypertension: Secondary | ICD-10-CM

## 2013-09-05 DIAGNOSIS — N289 Disorder of kidney and ureter, unspecified: Secondary | ICD-10-CM

## 2013-09-05 LAB — HEPATIC FUNCTION PANEL
ALT: 12 U/L (ref 0–53)
Total Bilirubin: 0.6 mg/dL (ref 0.3–1.2)
Total Protein: 6.9 g/dL (ref 6.0–8.3)

## 2013-09-05 LAB — URINALYSIS
Leukocytes, UA: NEGATIVE
Nitrite: NEGATIVE
Specific Gravity, Urine: 1.02 (ref 1.000–1.030)
pH: 5.5 (ref 5.0–8.0)

## 2013-09-05 LAB — BASIC METABOLIC PANEL
GFR: 51.31 mL/min — ABNORMAL LOW (ref >60.00)
Potassium: 4.4 mEq/L (ref 3.5–5.1)
Sodium: 145 mEq/L (ref 135–145)

## 2013-09-05 NOTE — Assessment & Plan Note (Signed)
Labs

## 2013-09-05 NOTE — Progress Notes (Signed)
Subjective:     Back Pain This is a chronic (acute on chronic) problem. The current episode started more than 1 month ago. The problem occurs constantly. The problem has been gradually worsening since onset. The pain is present in the lumbar spine. The pain is at a severity of 9/10. The pain is severe. The symptoms are aggravated by bending and twisting. Pertinent negatives include no abdominal pain, chest pain or weakness. He has tried NSAIDs, walking, muscle relaxant, analgesics and bed rest for the symptoms. The treatment provided no relief.  Constipation This is a recurrent problem. The current episode started 1 to 4 weeks ago (3 wks). Associated symptoms include back pain. Pertinent negatives include no abdominal pain, diarrhea or nausea.  BMs are better  Adam Weeks took 3 MS contins on Sun due to severe pain and developed n/v (he has been using 1 a day prior)    BP Readings from Last 3 Encounters:  09/05/13 121/70  04/26/13 132/62  04/23/13 98/60   Wt Readings from Last 3 Encounters:  09/05/13 157 lb (71.215 kg)  04/26/13 167 lb (75.751 kg)  04/23/13 166 lb (75.297 kg)     Review of Systems  Constitutional: Negative for appetite change, fatigue and unexpected weight change.  HENT: Negative for nosebleeds, congestion, sore throat, sneezing, trouble swallowing and neck pain.   Eyes: Negative for itching and visual disturbance.  Respiratory: Negative for cough.   Cardiovascular: Negative for chest pain, palpitations and leg swelling.  Gastrointestinal: Positive for constipation. Negative for nausea, abdominal pain, diarrhea, blood in stool and abdominal distention.  Genitourinary: Negative for frequency and hematuria.  Musculoskeletal: Positive for myalgias, back pain, arthralgias and gait problem. Negative for joint swelling.  Skin: Negative for rash.  Neurological: Negative for dizziness, tremors, speech difficulty and weakness.  Psychiatric/Behavioral: Negative for  suicidal ideas, confusion, sleep disturbance, dysphoric mood and agitation. The patient is not nervous/anxious.        Objective:   Physical Exam  Constitutional: He is oriented to person, place, and time. He appears well-developed and well-nourished. No distress.  HENT:  Head: Normocephalic and atraumatic.  Right Ear: External ear normal.  Left Ear: External ear normal.  Nose: Nose normal.  Mouth/Throat: Oropharynx is clear and moist. No oropharyngeal exudate.  Eyes: Conjunctivae and EOM are normal. Pupils are equal, round, and reactive to light. Right eye exhibits no discharge. Left eye exhibits no discharge. No scleral icterus.  Neck: Normal range of motion. Neck supple. No JVD present. No tracheal deviation present. No thyromegaly present.  Cardiovascular: Normal rate, regular rhythm, normal heart sounds and intact distal pulses.  Exam reveals no gallop and no friction rub.   No murmur heard. Pulmonary/Chest: Effort normal and breath sounds normal. No stridor. No respiratory distress. He has no wheezes. He has no rales. He exhibits no tenderness.  Abdominal: Soft. Bowel sounds are normal. He exhibits no distension and no mass. There is no tenderness. There is no rebound and no guarding.  Genitourinary: Rectum normal, prostate normal and penis normal. Guaiac negative stool. No penile tenderness.  Musculoskeletal: Normal range of motion. He exhibits tenderness (LS spine). He exhibits no edema.  Lymphadenopathy:    He has no cervical adenopathy.  Neurological: He is alert and oriented to person, place, and time. He has normal reflexes. No cranial nerve deficit. He exhibits normal muscle tone. Coordination normal.  Skin: Skin is warm and dry. No rash noted. He is not diaphoretic. No erythema. No pallor.  Psychiatric: He  has a normal mood and affect. His behavior is normal. Judgment and thought content normal.    Lab Results  Component Value Date   WBC 7.7 04/11/2013   HGB 9.7* 04/11/2013    HCT 28.8* 04/11/2013   PLT 150.0 04/11/2013   GLUCOSE 107* 04/11/2013   CHOL 86 04/11/2013   TRIG 89.0 04/11/2013   HDL 26.90* 04/11/2013   LDLCALC 41 04/11/2013   ALT 13 09/28/2012   AST 22 09/28/2012   NA 138 04/11/2013   K 4.5 04/11/2013   CL 104 04/11/2013   CREATININE 1.7* 04/11/2013   BUN 39* 04/11/2013   CO2 29 04/11/2013   TSH 1.63 02/18/2012   PSA 0.66 02/18/2012   INR 1.49 11/21/2009         Assessment & Plan:

## 2013-09-05 NOTE — Assessment & Plan Note (Addendum)
Worse  F/u w/[facility] for further pain management Labs

## 2013-09-05 NOTE — Assessment & Plan Note (Signed)
Check labs 

## 2013-09-05 NOTE — Assessment & Plan Note (Signed)
Continue with current prescription therapy as reflected on the Med list. Labs  

## 2013-12-09 ENCOUNTER — Ambulatory Visit (INDEPENDENT_AMBULATORY_CARE_PROVIDER_SITE_OTHER): Payer: Medicare Other | Admitting: Internal Medicine

## 2013-12-09 ENCOUNTER — Encounter: Payer: Self-pay | Admitting: Family Medicine

## 2013-12-09 ENCOUNTER — Encounter: Payer: Self-pay | Admitting: Internal Medicine

## 2013-12-09 ENCOUNTER — Ambulatory Visit (INDEPENDENT_AMBULATORY_CARE_PROVIDER_SITE_OTHER): Payer: Medicare Other | Admitting: Family Medicine

## 2013-12-09 ENCOUNTER — Other Ambulatory Visit (INDEPENDENT_AMBULATORY_CARE_PROVIDER_SITE_OTHER): Payer: Medicare Other

## 2013-12-09 VITALS — BP 130/72 | HR 72 | Temp 97.6°F | Wt 163.0 lb

## 2013-12-09 DIAGNOSIS — N289 Disorder of kidney and ureter, unspecified: Secondary | ICD-10-CM

## 2013-12-09 DIAGNOSIS — M25511 Pain in right shoulder: Secondary | ICD-10-CM

## 2013-12-09 DIAGNOSIS — M19011 Primary osteoarthritis, right shoulder: Secondary | ICD-10-CM | POA: Insufficient documentation

## 2013-12-09 DIAGNOSIS — R609 Edema, unspecified: Secondary | ICD-10-CM

## 2013-12-09 DIAGNOSIS — M25519 Pain in unspecified shoulder: Secondary | ICD-10-CM

## 2013-12-09 DIAGNOSIS — M19019 Primary osteoarthritis, unspecified shoulder: Secondary | ICD-10-CM

## 2013-12-09 DIAGNOSIS — I10 Essential (primary) hypertension: Secondary | ICD-10-CM

## 2013-12-09 LAB — TSH: TSH: 2.6 u[IU]/mL (ref 0.35–5.50)

## 2013-12-09 LAB — BASIC METABOLIC PANEL
Chloride: 105 mEq/L (ref 96–112)
Potassium: 3.9 mEq/L (ref 3.5–5.1)

## 2013-12-09 NOTE — Assessment & Plan Note (Signed)
Likely the most significant element with worsening of edema? For f/u lab today, for lasix 40 bid x 7 days, then qd after

## 2013-12-09 NOTE — Progress Notes (Signed)
Pre-visit discussion using our clinic review tool. No additional management support is needed unless otherwise documented below in the visit note.  

## 2013-12-09 NOTE — Assessment & Plan Note (Signed)
stable overall by history and exam, recent data reviewed with pt, and pt to continue medical treatment as before,  to f/u any worsening symptoms or concerns BP Readings from Last 3 Encounters:  12/09/13 130/72  12/09/13 130/72  09/05/13 121/70

## 2013-12-09 NOTE — Patient Instructions (Signed)
OK to increase the lasix to 40 mg twice per day for 7 days Please check your weights daily at home in the AM and write them down OK to go back to once per day lasix as before if the swelling in the left leg goes away sooner than the 7 days Please see Dr Posey Rea in 7 days  Please go to the LAB in the Basement (turn left off the elevator) for the tests to be done today You will be contacted by phone if any changes need to be made immediately.  Otherwise, you will receive a letter about your results with an explanation, but please check with MyChart first.  You have an appt at [phone] with Dr Katrinka Blazing for the right shoulder pain, though this may not happen exactly on time as you are being "worked in."

## 2013-12-09 NOTE — Patient Instructions (Signed)
Great to meet you Take tylenol 650 mg three times a day is the best evidence based medicine we have for arthritis.  Glucosamine sulfate 750mg  twice a day is a supplement that has been shown to help moderate to severe arthritis. Vitamin D 100o IU daily Fish oil 2 grams daily.  Tumeric 500mg  twice daily.  Capsaicin topically up to four times a day may also help with pain. I did an injection today, should be getting bette rover the next 5 days.  Try exercises daily.  Shoe inserts with good arch support may be helpful.  [employer] [facility] at [employer] [facility] could help.  Water aerobics and cycling with low resistance are the best two types of exercise for arthritis. Come back and see me in 6 weeks.

## 2013-12-09 NOTE — Assessment & Plan Note (Signed)
Patient does have some right sided shoulder arthritis. Patient did have injection today with some moderate improvement in pain. Hoping that this continues. Discuss that this is likely the underlying cause and may be giving him some impingement syndrome. Patient will try this as well as home exercises and was given a handout. Discuss over-the-counter medications he can help with arthritic pain. Patient will try these interventions as well as icing and come back in 3-4 weeks. If he continues to have pain like to do an ultrasound to make sure we can rule out a partial rotator cuff tear and we may consider doing an a.c. joint injection to see if that further delineates the pain.

## 2013-12-09 NOTE — Assessment & Plan Note (Signed)
?   djd vs other - for sport med referral Dr Katrinka Blazing today

## 2013-12-09 NOTE — Assessment & Plan Note (Addendum)
Suspect related to CKD, though forced po fluids, right > left venous insuff and even his hx of aortic valve dz cannot be ruled out;  For BMET today, lasix 40 bid for 7 days, daily wts, and f/u PCP 1 wk  Note:  Total time for pt hx, exam, review of record with pt in the room, determination of diagnoses and plan for further eval and tx is > 40 min, with over 50% spent in coordination and counseling of patient

## 2013-12-09 NOTE — Progress Notes (Signed)
Subjective:    Patient ID: Adam Weeks, male    DOB: 10/27/1927, 79 y.o.   MRN: [id number]  HPI  Here to f/u, had been hospd at [place] ,detail unclear, wt had been going down, now increased again with swelling to all extremities, including the LE's with unable to get shoes on yesterday. Overall good compliance with treatment, and good medicine tolerability, except the mirtazipine for 3 days.  Has known renal insuff, lisiopril apparently stopped at the [place]. Tx with colchrys for gout last jan 2014 and improved.  Has had echo and card eval at [facility] approx 5 mo ago. Also with right shoulder pain not improved after eval with films at [facility]  Also c/o "coldness" getting worse, but no fever, cough, dysuria. Past Medical History  Diagnosis Date  . Osteoporosis   . GERD (gastroesophageal reflux disease)   . Hyperlipidemia   . Hypertension   . CAD (coronary artery disease)   . Chronic kidney disease     stones   Past Surgical History  Procedure Laterality Date  . Cardiac valve replacement  2009    pig valve -- AVR  . Coronary artery bypass graft      2009  . Hernia repair      reports that he has quit smoking. He does not have any smokeless tobacco history on file. He reports that he does not drink alcohol or use illicit drugs. family history includes Cancer (age of onset: 40) in his mother; Heart disease in his father. No Known Allergies Current Outpatient Prescriptions on File Prior to Visit  Medication Sig Dispense Refill  . amLODipine (NORVASC) 10 MG tablet Take 10 mg by mouth daily.       [name] aspirin 81 MG tablet Take 81 mg by mouth daily.      [name] atorvastatin (LIPITOR) 20 MG tablet Take 20 mg by mouth daily.      . carvedilol (COREG) 12.5 MG tablet Take 12.5 mg by mouth 2 (two) times daily with a meal.      . Cholecalciferol 1000 UNITS tablet Take 1,000 Units by mouth daily.        . furosemide (LASIX) 40 MG tablet Take 40 mg by mouth daily.        . Linaclotide 290 MCG CAPS Take 1 capsule by mouth  daily.  30 capsule  11  . Multiple Vitamins-Minerals (OCUVITE PRESERVISION) TABS Take 2 tablets by mouth 2 (two) times daily.        . Omega-3 Fatty Acids (FISH OIL) 1000 MG CAPS Take 2 capsules by mouth 2 (two) times daily.       [name] omeprazole (PRILOSEC) 20 MG capsule Take 20 mg by mouth daily.        . colchicine 0.6 MG tablet Take 1 tablet (0.6 mg total) by mouth 2 (two) times daily. After one week then decrease to 1 per day  60 tablet  0  . venlafaxine (EFFEXOR) 75 MG tablet Take 75 mg by mouth daily.       No current facility-administered medications on file prior to visit.   Review of Systems  Constitutional: Negative for unexpected weight change, or unusual diaphoresis  HENT: Negative for tinnitus.   Eyes: Negative for photophobia and visual disturbance.  Respiratory: Negative for choking and stridor.   Gastrointestinal: Negative for vomiting and Weeks in stool.  Genitourinary: Negative for hematuria and decreased urine volume.  Musculoskeletal: Negative for acute joint swelling Skin: Negative for color change and wound.  Neurological: Negative for tremors and numbness other than noted  Psychiatric/Behavioral: Negative for decreased concentration or  hyperactivity.       Objective:   Physical Exam BP 130/72  Pulse 72  Temp(Src) 97.6 F (36.4 C) (Oral)  Wt 163 lb (73.936 kg)  SpO2 96% VS noted,  Constitutional: Pt appears well-developed and well-nourished.  HENT: Head: NCAT.  Right Ear: External ear normal.  Left Ear: External ear normal.  Eyes: Conjunctivae and EOM are normal. Pupils are equal, round, and reactive to light.  Neck: Normal range of motion. Neck supple.  Cardiovascular: Normal rate and regular rhythm.   Pulmonary/Chest: Effort normal and breath sounds normal.  Neurological: Pt is alert. Not confused  Skin: Skin is warm. No erythema. RLE 1+ edema to knee, LLE trace edema to knee Psychiatric: Pt behavior is normal. Thought content normal.     Assessment &  Plan:

## 2013-12-09 NOTE — Progress Notes (Signed)
  CC: Right shoulder pain  HPI: Patient is a very pleasant 79 year old gentleman coming in with right shoulder pain first second opinion. Patient states his he has been having this pain for quite some time. Patient is scheduled to be seen later this month at the [facility]. Patient is Arty had x-rays and has brought that with him. Patient states this pain seems to be worse with overhead activity or reaching behind his back. Patient is or number nature injury but was very active in his younger. Patient denies any radiation in the arm or any numbness. Patient denies waking him up at night but states that it seems to be a constant dull throbbing pain that can be as bad as 9/10. Patient has not tried over-the-counter medications at this time other than Tylenol which seems to be not beneficial.   Past medical, surgical, family and social history reviewed. Medications reviewed all in the electronic medical record.   Review of Systems: No headache, visual changes, nausea, vomiting, diarrhea, constipation, dizziness, abdominal pain, skin rash, fevers, chills, night sweats, weight loss, swollen lymph nodes, body aches, joint swelling, muscle aches, chest pain, shortness of breath, mood changes.   Objective:    Blood pressure 130/72, pulse 72, temperature 97.6 F (36.4 C), temperature source Oral, weight 163 lb (73.936 kg), SpO2 96.00%.   General: No apparent distress alert and oriented x3 mood and affect normal, dressed appropriately.  HEENT: Pupils equal, extraocular movements intact Respiratory: Patient's speak in full sentences and does not appear short of breath Cardiovascular: No lower extremity edema, non tender, no erythema Skin: Warm dry intact with no signs of infection or rash on extremities or on axial skeleton. Abdomen: Soft nontender Neuro: Cranial nerves II through XII are intact, neurovascularly intact in all extremities with 2+ DTRs and 2+ pulses. Lymph: No lymphadenopathy of posterior  or anterior cervical chain or axillae bilaterally.  Gait normal with good balance and coordination.  MSK: Non tender with full range of motion and good stability and symmetric strength and tone of elbows, wrist, hip, knee and ankles bilaterally. Mild crepitus in osteoarthritic changes of multiple joints. Shoulder: Right  inspection showed the patient has atrophy of the shoulder musculature bilaterally. Palpation shows the patient is tender to palpation over the posterior lateral aspect of the shoulder Patient does have full passive range of motion Rotator cuff strength normal throughout. Positive signs of impingement with negative Neer and Hawkin's tests, empty can sign. Speeds and Yergason's tests normal. No labral pathology noted with negative Obrien's, negative clunk and good stability. Normal scapular function observed. Mild painful arc and no drop arm sign. No apprehension sign  X-rays were reviewed by me today. Patient's x-ray show that patient has moderate osteophytic changes mostly of the glenohumeral as well as the a.c. joint.  After informed written and verbal consent, patient was seated on exam table. Right shoulder was prepped with alcohol swab and utilizing posterior approach, patient's right glenohumeral space was injected with 4:1  marcaine 0.5%: Kenalog 40mg /dL. Patient tolerated the procedure well without immediate complications.      Impression and Recommendations:     This case required medical decision making of moderate complexity.

## 2013-12-19 ENCOUNTER — Ambulatory Visit: Payer: Medicare Other | Admitting: Internal Medicine

## 2013-12-27 ENCOUNTER — Other Ambulatory Visit (INDEPENDENT_AMBULATORY_CARE_PROVIDER_SITE_OTHER): Payer: Medicare Other

## 2013-12-27 ENCOUNTER — Ambulatory Visit (INDEPENDENT_AMBULATORY_CARE_PROVIDER_SITE_OTHER): Payer: Medicare Other | Admitting: Internal Medicine

## 2013-12-27 ENCOUNTER — Encounter: Payer: Self-pay | Admitting: Internal Medicine

## 2013-12-27 VITALS — BP 140/80 | HR 87 | Temp 99.0°F | Resp 16 | Wt 159.0 lb

## 2013-12-27 DIAGNOSIS — Z0000 Encounter for general adult medical examination without abnormal findings: Secondary | ICD-10-CM

## 2013-12-27 DIAGNOSIS — K5900 Constipation, unspecified: Secondary | ICD-10-CM

## 2013-12-27 DIAGNOSIS — R5381 Other malaise: Secondary | ICD-10-CM

## 2013-12-27 DIAGNOSIS — N320 Bladder-neck obstruction: Secondary | ICD-10-CM

## 2013-12-27 DIAGNOSIS — M545 Low back pain: Secondary | ICD-10-CM

## 2013-12-27 DIAGNOSIS — Z23 Encounter for immunization: Secondary | ICD-10-CM

## 2013-12-27 DIAGNOSIS — R5383 Other fatigue: Secondary | ICD-10-CM

## 2013-12-27 DIAGNOSIS — I2510 Atherosclerotic heart disease of native coronary artery without angina pectoris: Secondary | ICD-10-CM

## 2013-12-27 LAB — CBC WITH DIFFERENTIAL/PLATELET
Basophils Relative: 0.1 % (ref 0.0–3.0)
Eosinophils Absolute: 0 10*3/uL (ref 0.0–0.7)
Eosinophils Relative: 0.2 % (ref 0.0–5.0)
HCT: 35 % — ABNORMAL LOW (ref 39.0–52.0)
Hemoglobin: 11.8 g/dL — ABNORMAL LOW (ref 13.0–17.0)
Lymphs Abs: 1 10*3/uL (ref 0.7–4.0)
MCV: 95.6 fl (ref 78.0–100.0)
Monocytes Absolute: 0.5 10*3/uL (ref 0.1–1.0)
Monocytes Relative: 3.6 % (ref 3.0–12.0)
Neutro Abs: 11.5 10*3/uL — ABNORMAL HIGH (ref 1.4–7.7)
Neutrophils Relative %: 88.2 % — ABNORMAL HIGH (ref 43.0–77.0)
Platelets: 163 10*3/uL (ref 150.0–400.0)
RBC: 3.66 Mil/uL — ABNORMAL LOW (ref 4.22–5.81)
WBC: 13 10*3/uL — ABNORMAL HIGH (ref 4.5–10.5)

## 2013-12-27 LAB — URINALYSIS
Bilirubin Urine: NEGATIVE
Hgb urine dipstick: NEGATIVE
Ketones, ur: NEGATIVE
Leukocytes, UA: NEGATIVE
Nitrite: NEGATIVE
Specific Gravity, Urine: 1.015
Total Protein, Urine: NEGATIVE
Urine Glucose: NEGATIVE
Urobilinogen, UA: 0.2
pH: 5 (ref 5.0–8.0)

## 2013-12-27 LAB — SEDIMENTATION RATE: Sed Rate: 17 mm/hr (ref 0–22)

## 2013-12-27 LAB — HEPATIC FUNCTION PANEL
AST: 20 U/L (ref 0–37)
Albumin: 3.8 g/dL (ref 3.5–5.2)
Alkaline Phosphatase: 31 U/L — ABNORMAL LOW (ref 39–117)
Total Bilirubin: 0.7 mg/dL (ref 0.3–1.2)
Total Protein: 6.4 g/dL (ref 6.0–8.3)

## 2013-12-27 LAB — URIC ACID: Uric Acid, Serum: 8.5 mg/dL — ABNORMAL HIGH (ref 4.0–7.8)

## 2013-12-27 LAB — PSA: PSA: 0.23 ng/mL (ref 0.10–4.00)

## 2013-12-27 LAB — TSH: TSH: 1.25 u[IU]/mL (ref 0.35–5.50)

## 2013-12-27 MED ORDER — PREDNISOLONE 5 MG PO TABS: 5.0000 mg | ORAL_TABLET | Freq: Every day | ORAL | Status: DC

## 2013-12-27 NOTE — Progress Notes (Signed)
Pre visit review using our clinic review tool, if applicable. No additional management support is needed unless otherwise documented below in the visit note. 

## 2013-12-27 NOTE — Assessment & Plan Note (Signed)
Continue with current prescription therapy as reflected on the Med list.  

## 2013-12-27 NOTE — Assessment & Plan Note (Signed)
We can try a low dose Prednisone again  Potential benefits of a long term low dose steroid  use as well as potential risks  and complications were explained to the patient and were aknowledged.

## 2013-12-27 NOTE — Progress Notes (Signed)
Subjective:   The patient is here for a wellness exam. The patient has been doing well overall without major physical or psychological issues going on lately. C/o fatigue   Back Pain This is a chronic (acute on chronic) problem. The current episode started more than 1 month ago. The problem occurs constantly. The problem has been gradually worsening since onset. The pain is present in the lumbar spine. The pain is at a severity of 9/10. The pain is severe. The symptoms are aggravated by bending and twisting. Pertinent negatives include no abdominal pain, chest pain or weakness. He has tried NSAIDs, walking, muscle relaxant, analgesics and bed rest for the symptoms. The treatment provided no relief.  Constipation This is a recurrent problem. The current episode started 1 to 4 weeks ago (3 wks). Associated symptoms include back pain. Pertinent negatives include no abdominal pain, diarrhea or nausea.     BP Readings from Last 3 Encounters:  12/27/13 140/80  12/09/13 130/72  12/09/13 130/72   Wt Readings from Last 3 Encounters:  12/27/13 159 lb (72.122 kg)  12/09/13 163 lb (73.936 kg)  12/09/13 163 lb (73.936 kg)     Review of Systems  Constitutional: Negative for appetite change, fatigue and unexpected weight change.  HENT: Negative for congestion, nosebleeds, sneezing, sore throat and trouble swallowing.   Eyes: Negative for itching and visual disturbance.  Respiratory: Negative for cough.   Cardiovascular: Negative for chest pain, palpitations and leg swelling.  Gastrointestinal: Positive for constipation. Negative for nausea, abdominal pain, diarrhea, blood in stool and abdominal distention.  Genitourinary: Negative for frequency and hematuria.  Musculoskeletal: Positive for arthralgias, back pain, gait problem and myalgias. Negative for joint swelling and neck pain.  Skin: Negative for rash.  Neurological: Negative for dizziness, tremors, speech difficulty and weakness.   Psychiatric/Behavioral: Negative for suicidal ideas, confusion, sleep disturbance, dysphoric mood and agitation. The patient is not nervous/anxious.        Objective:   Physical Exam  Constitutional: He is oriented to person, place, and time. He appears well-developed and well-nourished. No distress.  HENT:  Head: Normocephalic and atraumatic.  Right Ear: External ear normal.  Left Ear: External ear normal.  Nose: Nose normal.  Mouth/Throat: Oropharynx is clear and moist. No oropharyngeal exudate.  Eyes: Conjunctivae and EOM are normal. Pupils are equal, round, and reactive to light. Right eye exhibits no discharge. Left eye exhibits no discharge. No scleral icterus.  Neck: Normal range of motion. Neck supple. No JVD present. No tracheal deviation present. No thyromegaly present.  Cardiovascular: Normal rate, regular rhythm, normal heart sounds and intact distal pulses.  Exam reveals no gallop and no friction rub.   No murmur heard. Pulmonary/Chest: Effort normal and breath sounds normal. No stridor. No respiratory distress. He has no wheezes. He has no rales. He exhibits no tenderness.  Abdominal: Soft. Bowel sounds are normal. He exhibits no distension and no mass. There is no tenderness. There is no rebound and no guarding.  Genitourinary: Rectum normal, prostate normal and penis normal. Guaiac negative stool. No penile tenderness.  Musculoskeletal: Normal range of motion. He exhibits tenderness (LS spine). He exhibits no edema.  Lymphadenopathy:    He has no cervical adenopathy.  Neurological: He is alert and oriented to person, place, and time. He has normal reflexes. No cranial nerve deficit. He exhibits normal muscle tone. Coordination normal.  Skin: Skin is warm and dry. No rash noted. He is not diaphoretic. No erythema. No pallor.  Psychiatric: He  has a normal mood and affect. His behavior is normal. Judgment and thought content normal.    Lab Results  Component Value Date    WBC 7.7 04/11/2013   HGB 9.7* 04/11/2013   HCT 28.8* 04/11/2013   PLT 150.0 04/11/2013   GLUCOSE 93 12/09/2013   CHOL 86 04/11/2013   TRIG 89.0 04/11/2013   HDL 26.90* 04/11/2013   LDLCALC 41 04/11/2013   ALT 12 09/05/2013   AST 21 09/05/2013   NA 141 12/09/2013   K 3.9 12/09/2013   CL 105 12/09/2013   CREATININE 1.6* 12/09/2013   BUN 30* 12/09/2013   CO2 27 12/09/2013   TSH 2.60 12/09/2013   PSA 0.66 02/18/2012   INR 1.49 11/21/2009         Assessment & Plan:

## 2013-12-27 NOTE — Assessment & Plan Note (Signed)

## 2013-12-27 NOTE — Assessment & Plan Note (Signed)
We can try a low dose Prednisone  Potential benefits of a long term low dose steroid  use as well as potential risks  and complications were explained to the patient and were aknowledged.

## 2014-01-02 ENCOUNTER — Telehealth: Payer: Self-pay | Admitting: Internal Medicine

## 2014-01-02 NOTE — Telephone Encounter (Signed)
Requesting to have a copy of labs from last week mailed to him.

## 2014-01-02 NOTE — Telephone Encounter (Signed)
12/27/13 Labs mailed.

## 2014-01-27 ENCOUNTER — Ambulatory Visit: Payer: Medicare Other | Admitting: Internal Medicine

## 2014-02-03 ENCOUNTER — Ambulatory Visit (INDEPENDENT_AMBULATORY_CARE_PROVIDER_SITE_OTHER)
Admission: RE | Admit: 2014-02-03 | Discharge: 2014-02-03 | Disposition: A | Discharge status: Home / Self Care | Payer: Medicare Other | Source: Ambulatory Visit | Attending: Internal Medicine | Admitting: Internal Medicine

## 2014-02-03 ENCOUNTER — Encounter: Payer: Self-pay | Admitting: Internal Medicine

## 2014-02-03 ENCOUNTER — Ambulatory Visit (INDEPENDENT_AMBULATORY_CARE_PROVIDER_SITE_OTHER): Payer: Medicare Other | Admitting: Internal Medicine

## 2014-02-03 VITALS — BP 120/64 | HR 76 | Temp 97.9°F | Resp 16 | Wt 160.0 lb

## 2014-02-03 DIAGNOSIS — I2510 Atherosclerotic heart disease of native coronary artery without angina pectoris: Secondary | ICD-10-CM

## 2014-02-03 DIAGNOSIS — R059 Cough, unspecified: Secondary | ICD-10-CM

## 2014-02-03 DIAGNOSIS — R609 Edema, unspecified: Secondary | ICD-10-CM

## 2014-02-03 DIAGNOSIS — M545 Low back pain, unspecified: Secondary | ICD-10-CM

## 2014-02-03 DIAGNOSIS — R05 Cough: Secondary | ICD-10-CM

## 2014-02-03 DIAGNOSIS — I10 Essential (primary) hypertension: Secondary | ICD-10-CM

## 2014-02-03 DIAGNOSIS — R600 Localized edema: Secondary | ICD-10-CM

## 2014-02-03 DIAGNOSIS — J069 Acute upper respiratory infection, unspecified: Secondary | ICD-10-CM

## 2014-02-03 MED ORDER — TORSEMIDE 100 MG PO TABS: 100.0000 mg | ORAL_TABLET | Freq: Every day | ORAL | Status: DC

## 2014-02-03 MED ORDER — AMOXICILLIN-POT CLAVULANATE 875-125 MG PO TABS
1.0000 | ORAL_TABLET | Freq: Two times a day (BID) | ORAL | Status: DC
Start: 2014-02-03 — End: 2014-06-03

## 2014-02-03 NOTE — Patient Instructions (Signed)
Low salt diet  Use over-the-counter  "cold" medicines  such as  "Afrin" nasal spray for nasal congestion as directed instead. Use" Delsym" or" Robitussin" cough syrup varietis for cough.  You can use plain "Tylenol" or "Advil" for fever, chills and achyness.  Please, make an appointment if you are not better or if you're worse.

## 2014-02-03 NOTE — Progress Notes (Signed)
Pre visit review using our clinic review tool, if applicable. No additional management support is needed unless otherwise documented below in the visit note. 

## 2014-02-04 ENCOUNTER — Encounter: Payer: Self-pay | Admitting: Internal Medicine

## 2014-02-04 DIAGNOSIS — J069 Acute upper respiratory infection, unspecified: Secondary | ICD-10-CM | POA: Insufficient documentation

## 2014-02-04 NOTE — Assessment & Plan Note (Signed)
Continue with current prescription therapy as reflected on the Med list.  

## 2014-02-04 NOTE — Progress Notes (Signed)
Subjective:   C/o URI sx's cough and nasal brown d/c x 2 wks C/o B ankle edema - he was drinking a lot of tomato juice and eating canned chicken soup C/o fatigue   Back Pain This is a chronic (acute on chronic) problem. The current episode started more than 1 month ago. The problem occurs constantly. The problem has been gradually worsening since onset. The pain is present in the lumbar spine. The pain is at a severity of 9/10. The pain is severe. The symptoms are aggravated by bending and twisting. Pertinent negatives include no abdominal pain, chest pain or weakness. He has tried NSAIDs, walking, muscle relaxant, analgesics and bed rest for the symptoms. The treatment provided no relief.  Constipation This is a recurrent problem. The current episode started 1 to 4 weeks ago (3 wks). Associated symptoms include back pain. Pertinent negatives include no abdominal pain, diarrhea or nausea.     BP Readings from Last 3 Encounters:  02/03/14 120/64  12/27/13 140/80  12/09/13 130/72   Wt Readings from Last 3 Encounters:  02/03/14 160 lb (72.576 kg)  12/27/13 159 lb (72.122 kg)  12/09/13 163 lb (73.936 kg)     Review of Systems  Constitutional: Negative for appetite change, fatigue and unexpected weight change.  HENT: Negative for congestion, nosebleeds, sneezing, sore throat and trouble swallowing.   Eyes: Negative for itching and visual disturbance.  Respiratory: Negative for cough.   Cardiovascular: Negative for chest pain, palpitations and leg swelling.  Gastrointestinal: Positive for constipation. Negative for nausea, abdominal pain, diarrhea, blood in stool and abdominal distention.  Genitourinary: Negative for frequency and hematuria.  Musculoskeletal: Positive for arthralgias, back pain, gait problem and myalgias. Negative for joint swelling and neck pain.  Skin: Negative for rash.  Neurological: Negative for dizziness, tremors, speech difficulty and weakness.   Psychiatric/Behavioral: Negative for suicidal ideas, confusion, sleep disturbance, dysphoric mood and agitation. The patient is not nervous/anxious.        Objective:   Physical Exam  Constitutional: He is oriented to person, place, and time. He appears well-developed and well-nourished. No distress.  HENT:  Head: Normocephalic and atraumatic.  Right Ear: External ear normal.  Left Ear: External ear normal.  Nose: Nose normal.  Mouth/Throat: Oropharynx is clear and moist. No oropharyngeal exudate.  Eyes: Conjunctivae and EOM are normal. Pupils are equal, round, and reactive to light. Right eye exhibits no discharge. Left eye exhibits no discharge. No scleral icterus.  Neck: Normal range of motion. Neck supple. No JVD present. No tracheal deviation present. No thyromegaly present.  Cardiovascular: Normal rate, regular rhythm, normal heart sounds and intact distal pulses.  Exam reveals no gallop and no friction rub.   No murmur heard. Pulmonary/Chest: Effort normal and breath sounds normal. No stridor. No respiratory distress. He has no wheezes. He has no rales. He exhibits no tenderness.  Abdominal: Soft. Bowel sounds are normal. He exhibits no distension and no mass. There is no tenderness. There is no rebound and no guarding.  Genitourinary: Rectum normal, prostate normal and penis normal. Guaiac negative stool. No penile tenderness.  Musculoskeletal: Normal range of motion. He exhibits tenderness (LS spine). He exhibits no edema.  Lymphadenopathy:    He has no cervical adenopathy.  Neurological: He is alert and oriented to person, place, and time. He has normal reflexes. No cranial nerve deficit. He exhibits normal muscle tone. Coordination normal.  Skin: Skin is warm and dry. No rash noted. He is not diaphoretic. No erythema.  No pallor.  Psychiatric: He has a normal mood and affect. His behavior is normal. Judgment and thought content normal.    Lab Results  Component Value Date    WBC 13.0* 12/27/2013   HGB 11.8* 12/27/2013   HCT 35.0* 12/27/2013   PLT 163.0 12/27/2013   GLUCOSE 93 12/09/2013   CHOL 86 04/11/2013   TRIG 89.0 04/11/2013   HDL 26.90* 04/11/2013   LDLCALC 41 04/11/2013   ALT 24 12/27/2013   AST 20 12/27/2013   NA 141 12/09/2013   K 3.9 12/09/2013   CL 105 12/09/2013   CREATININE 1.6* 12/09/2013   BUN 30* 12/09/2013   CO2 27 12/09/2013   TSH 1.25 12/27/2013   PSA 0.23 12/27/2013   INR 1.49 11/21/2009         Assessment & Plan:

## 2014-02-04 NOTE — Assessment & Plan Note (Signed)
2/15 worse due to salt excess intake  NAS diet D/c Furosemide Start Demadex 100 mg CXR

## 2014-02-04 NOTE — Assessment & Plan Note (Signed)
Augmentin x 10 d 

## 2014-02-07 ENCOUNTER — Ambulatory Visit: Payer: Medicare Other | Admitting: Internal Medicine

## 2014-02-08 ENCOUNTER — Ambulatory Visit: Payer: Medicare Other | Admitting: Internal Medicine

## 2014-03-24 ENCOUNTER — Ambulatory Visit (INDEPENDENT_AMBULATORY_CARE_PROVIDER_SITE_OTHER): Payer: Medicare Other | Admitting: Family Medicine

## 2014-03-24 ENCOUNTER — Other Ambulatory Visit (INDEPENDENT_AMBULATORY_CARE_PROVIDER_SITE_OTHER): Payer: Medicare Other

## 2014-03-24 ENCOUNTER — Encounter: Payer: Self-pay | Admitting: Family Medicine

## 2014-03-24 VITALS — BP 132/64 | HR 74

## 2014-03-24 DIAGNOSIS — M25511 Pain in right shoulder: Secondary | ICD-10-CM

## 2014-03-24 DIAGNOSIS — M19019 Primary osteoarthritis, unspecified shoulder: Secondary | ICD-10-CM

## 2014-03-24 DIAGNOSIS — M25519 Pain in unspecified shoulder: Secondary | ICD-10-CM

## 2014-03-24 DIAGNOSIS — M19011 Primary osteoarthritis, right shoulder: Secondary | ICD-10-CM

## 2014-03-24 MED ORDER — COLCHICINE 0.6 MG PO TABS: 0.6000 mg | ORAL_TABLET | Freq: Every day | ORAL | Status: DC

## 2014-03-24 NOTE — Patient Instructions (Addendum)
You are doing great Gave you another injection today and we can repeat every 2-3 months. Start the exercises 2-3 times a week  Start colchicine daily.  Come back when you need me.   Gout Gout is an inflammatory arthritis caused by a buildup of uric acid crystals in the joints. Uric acid is a chemical that is normally present in the blood. When the level of uric acid in the blood is too high it can form crystals that deposit in your joints and tissues. This causes joint redness, soreness, and swelling (inflammation). Repeat attacks are common. Over time, uric acid crystals can form into masses (tophi) near a joint, destroying bone and causing disfigurement. Gout is treatable and often preventable. CAUSES  The disease begins with elevated levels of uric acid in the blood. Uric acid is produced by your body when it breaks down a naturally found substance called purines. Certain foods you eat, such as meats and fish, contain high amounts of purines. Causes of an elevated uric acid level include:  Being passed down from parent to child (heredity).  Diseases that cause increased uric acid production (such as obesity, psoriasis, and certain cancers).  Excessive alcohol use.  Diet, especially diets rich in meat and seafood.  Medicines, including certain cancer-fighting medicines (chemotherapy), water pills (diuretics), and aspirin.  Chronic kidney disease. The kidneys are no longer able to remove uric acid well.  Problems with metabolism. Conditions strongly associated with gout include:  Obesity.  High blood pressure.  High cholesterol.  Diabetes. Not everyone with elevated uric acid levels gets gout. It is not understood why some people get gout and others do not. Surgery, joint injury, and eating too much of certain foods are some of the factors that can lead to gout attacks. SYMPTOMS   An attack of gout comes on quickly. It causes intense pain with redness, swelling, and warmth in a  joint.  Fever can occur.  Often, only one joint is involved. Certain joints are more commonly involved:  Base of the big toe.  Knee.  Ankle.  Wrist.  Finger. Without treatment, an attack usually goes away in a few days to weeks. Between attacks, you usually will not have symptoms, which is different from many other forms of arthritis. DIAGNOSIS  Your caregiver will suspect gout based on your symptoms and exam. In some cases, tests may be recommended. The tests may include:  Blood tests.  Urine tests.  X-rays.  Joint fluid exam. This exam requires a needle to remove fluid from the joint (arthrocentesis). Using a microscope, gout is confirmed when uric acid crystals are seen in the joint fluid. TREATMENT  There are two phases to gout treatment: treating the sudden onset (acute) attack and preventing attacks (prophylaxis).  Treatment of an Acute Attack.  Medicines are used. These include anti-inflammatory medicines or steroid medicines.  An injection of steroid medicine into the affected joint is sometimes necessary.  The painful joint is rested. Movement can worsen the arthritis.  You may use warm or cold treatments on painful joints, depending which works best for you.  Treatment to Prevent Attacks.  If you suffer from frequent gout attacks, your caregiver may advise preventive medicine. These medicines are started after the acute attack subsides. These medicines either help your kidneys eliminate uric acid from your body or decrease your uric acid production. You may need to stay on these medicines for a very long time.  The early phase of treatment with preventive medicine can be  associated with an increase in acute gout attacks. For this reason, during the first few months of treatment, your caregiver may also advise you to take medicines usually used for acute gout treatment. Be sure you understand your caregiver's directions. Your caregiver may make several adjustments  to your medicine dose before these medicines are effective.  Discuss dietary treatment with your caregiver or dietitian. Alcohol and drinks high in sugar and fructose and foods such as meat, poultry, and seafood can increase uric acid levels. Your caregiver or dietician can advise you on drinks and foods that should be limited. HOME CARE INSTRUCTIONS   Do not take aspirin to relieve pain. This raises uric acid levels.  Only take over-the-counter or prescription medicines for pain, discomfort, or fever as directed by your caregiver.  Rest the joint as much as possible. When in bed, keep sheets and blankets off painful areas.  Keep the affected joint raised (elevated).  Apply warm or cold treatments to painful joints. Use of warm or cold treatments depends on which works best for you.  Use crutches if the painful joint is in your leg.  Drink enough fluids to keep your urine clear or pale yellow. This helps your body get rid of uric acid. Limit alcohol, sugary drinks, and fructose drinks.  Follow your dietary instructions. Pay careful attention to the amount of protein you eat. Your daily diet should emphasize fruits, vegetables, whole grains, and fat-free or low-fat milk products. Discuss the use of coffee, vitamin C, and cherries with your caregiver or dietician. These may be helpful in lowering uric acid levels.  Maintain a healthy body weight. SEEK MEDICAL CARE IF:   You develop diarrhea, vomiting, or any side effects from medicines.  You do not feel better in 24 hours, or you are getting worse. SEEK IMMEDIATE MEDICAL CARE IF:   Your joint becomes suddenly more tender, and you have chills or a fever. MAKE SURE YOU:   Understand these instructions.  Will watch your condition.  Will get help right away if you are not doing well or get worse. Document Released: 12/12/2000 Document Revised: 04/11/2013 Document Reviewed: 07/28/2012 [facility] Patient Information 2014 [employer],  [place].

## 2014-03-24 NOTE — Progress Notes (Signed)
CC: Right shoulder pain  HPI: Patient is a very pleasant 80 year old gentleman coming in with right shoulder pain followup. Patient was seen previously 3 months ago and was given an intra-articular injection. Patient did do very well with the injection. Patient states he was doing really well with no pain until the last 2 weeks. Patient states last 2 weeks at least of having a similar symptoms again. Patient states it hurts with any type of movement and even sleeping on it can wake him up. Patient has tried some of the over-the-counter medications without any significant improvement. Patient has not been taking anything for pain prescription.  Denies any numbness or any weakness.   Past medical, surgical, family and social history reviewed. Medications reviewed all in the electronic medical record.   Review of Systems: No headache, visual changes, nausea, vomiting, diarrhea, constipation, dizziness, abdominal pain, skin rash, fevers, chills, night sweats, weight loss, swollen lymph nodes, body aches, joint swelling, muscle aches, chest pain, shortness of breath, mood changes.   Objective:    Blood pressure 132/64, pulse 74, SpO2 97.00%.   General: No apparent distress alert and oriented x3 mood and affect normal, dressed appropriately.  HEENT: Pupils equal, extraocular movements intact Respiratory: Patient's speak in full sentences and does not appear short of breath Cardiovascular: No lower extremity edema, non tender, no erythema Skin: Warm dry intact with no signs of infection or rash on extremities or on axial skeleton. Abdomen: Soft nontender Neuro: Cranial nerves II through XII are intact, neurovascularly intact in all extremities with 2+ DTRs and 2+ pulses. Lymph: No lymphadenopathy of posterior or anterior cervical chain or axillae bilaterally.  Gait normal with good balance and coordination.  MSK: Non tender with full range of motion and good stability and symmetric strength and tone  of elbows, wrist, hip, knee and ankles bilaterally. Mild crepitus in osteoarthritic changes of multiple joints. Shoulder: Right  inspection showed the patient has atrophy of the shoulder musculature bilaterally. Palpation shows the patient is tender to palpation over the posterior lateral aspect of the shoulder Patient does have full passive range of motion Rotator cuff strength normal throughout. Positive signs of impingement with positive Neer and Hawkin's tests, empty can sign. Speeds and Yergason's tests normal. No labral pathology noted with negative Obrien's, negative clunk and good stability. Normal scapular function observed. Mild painful arc and no drop arm sign. No apprehension sign  MSK US performed of: Right This study was ordered, performed, and interpreted by Terrilee FilesZach Smith D.O.  Shoulder:   Supraspinatus:  Appears normal on long and transverse views, no bursal bulge seen with shoulder abduction on impingement view. Infraspinatus:  Appears normal on long and transverse views. Subscapularis:  Appears normal on long and transverse views. Teres Minor:  Appears normal on long and transverse views. 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 Biceps Tendon:  Appears normal on long and transverse views, no fraying of tendon, tendon located in intertubercular groove, no subluxation with shoulder internal or external rotation. No increased power doppler signal.   Impression: Osteoarthritis of the right shoulder moderate  Procedure: Real-time Ultrasound Guided Injection of right glenohumeral joint Device: GE Logiq E  Ultrasound guided injection is preferred based studies that show increased duration, increased effect, greater accuracy, decreased procedural pain, increased response rate with ultrasound guided versus blind injection.   Verbal informed consent obtained.  Time-out conducted.  Noted no overlying erythema, induration, or other signs of local infection.  Skin prepped in a sterile fashion.  Local anesthesia: Topical Ethyl chloride.  With sterile technique and under real time ultrasound guidance:  Joint visualized.  23g 1  inch needle inserted posterior approach. Pictures taken for needle placement. Patient did have injection of 2 cc of 1% lidocaine, 2 cc of 0.5% Marcaine, and 1.0 cc of Kenalog 40 mg/dL. Completed without difficulty  Pain immediately resolved suggesting accurate placement of the medication.  Advised to call if fevers/chills, erythema, induration, drainage, or persistent bleeding.  Images permanently stored and available for review in the ultrasound unit.  Impression: Technically successful ultrasound guided injection.    Impression and Recommendations:     This case required medical decision making of moderate complexity. Spent greater than 25 minutes with patient face-to-face and had greater than 50% of counseling including as described above in assessment and plan.

## 2014-03-24 NOTE — Assessment & Plan Note (Signed)
Crystal arthropathy also noted. Patient given Thayer OhmChris and warned of potential side effects the patient's renal insufficiency as well as being on a statin. Patient return if any muscle pain occurs immediately. Patient will do the colcrys daily if patient has any trouble to try [facility] Home exercise 2-3 times a week Discussed icing Patient can come back every 2-3 months for further injections. Patient would be a candidate for viscous supplementation.

## 2014-06-03 ENCOUNTER — Emergency Department (HOSPITAL_COMMUNITY): Payer: Medicare Other

## 2014-06-03 ENCOUNTER — Encounter (HOSPITAL_COMMUNITY): Payer: Self-pay | Admitting: Emergency Medicine

## 2014-06-03 ENCOUNTER — Inpatient Hospital Stay (HOSPITAL_COMMUNITY)
Admission: EM | Admit: 2014-06-03 | Discharge: 2014-06-05 | DRG: 069 | Disposition: A | Discharge status: Home Health | Payer: Medicare Other | Attending: Internal Medicine | Admitting: Internal Medicine

## 2014-06-03 DIAGNOSIS — I2510 Atherosclerotic heart disease of native coronary artery without angina pectoris: Secondary | ICD-10-CM

## 2014-06-03 DIAGNOSIS — E873 Alkalosis: Secondary | ICD-10-CM

## 2014-06-03 DIAGNOSIS — J069 Acute upper respiratory infection, unspecified: Secondary | ICD-10-CM

## 2014-06-03 DIAGNOSIS — S301XXA Contusion of abdominal wall, initial encounter: Secondary | ICD-10-CM

## 2014-06-03 DIAGNOSIS — M8580 Other specified disorders of bone density and structure, unspecified site: Secondary | ICD-10-CM

## 2014-06-03 DIAGNOSIS — G459 Transient cerebral ischemic attack, unspecified: Principal | ICD-10-CM

## 2014-06-03 DIAGNOSIS — M353 Polymyalgia rheumatica: Secondary | ICD-10-CM

## 2014-06-03 DIAGNOSIS — Z952 Presence of prosthetic heart valve: Secondary | ICD-10-CM

## 2014-06-03 DIAGNOSIS — R4789 Other speech disturbances: Secondary | ICD-10-CM | POA: Diagnosis present

## 2014-06-03 DIAGNOSIS — R5381 Other malaise: Secondary | ICD-10-CM

## 2014-06-03 DIAGNOSIS — Z7982 Long term (current) use of aspirin: Secondary | ICD-10-CM

## 2014-06-03 DIAGNOSIS — M545 Low back pain, unspecified: Secondary | ICD-10-CM

## 2014-06-03 DIAGNOSIS — N183 Chronic kidney disease, stage 3 unspecified: Secondary | ICD-10-CM

## 2014-06-03 DIAGNOSIS — R600 Localized edema: Secondary | ICD-10-CM

## 2014-06-03 DIAGNOSIS — K219 Gastro-esophageal reflux disease without esophagitis: Secondary | ICD-10-CM | POA: Diagnosis present

## 2014-06-03 DIAGNOSIS — K8020 Calculus of gallbladder without cholecystitis without obstruction: Secondary | ICD-10-CM

## 2014-06-03 DIAGNOSIS — Z8249 Family history of ischemic heart disease and other diseases of the circulatory system: Secondary | ICD-10-CM

## 2014-06-03 DIAGNOSIS — I10 Essential (primary) hypertension: Secondary | ICD-10-CM

## 2014-06-03 DIAGNOSIS — D649 Anemia, unspecified: Secondary | ICD-10-CM

## 2014-06-03 DIAGNOSIS — M810 Age-related osteoporosis without current pathological fracture: Secondary | ICD-10-CM | POA: Diagnosis present

## 2014-06-03 DIAGNOSIS — M549 Dorsalgia, unspecified: Secondary | ICD-10-CM | POA: Diagnosis present

## 2014-06-03 DIAGNOSIS — Z951 Presence of aortocoronary bypass graft: Secondary | ICD-10-CM

## 2014-06-03 DIAGNOSIS — N189 Chronic kidney disease, unspecified: Secondary | ICD-10-CM

## 2014-06-03 DIAGNOSIS — R4182 Altered mental status, unspecified: Secondary | ICD-10-CM

## 2014-06-03 DIAGNOSIS — K5792 Diverticulitis of intestine, part unspecified, without perforation or abscess without bleeding: Secondary | ICD-10-CM

## 2014-06-03 DIAGNOSIS — G8929 Other chronic pain: Secondary | ICD-10-CM | POA: Diagnosis present

## 2014-06-03 DIAGNOSIS — M109 Gout, unspecified: Secondary | ICD-10-CM

## 2014-06-03 DIAGNOSIS — M25511 Pain in right shoulder: Secondary | ICD-10-CM

## 2014-06-03 DIAGNOSIS — N179 Acute kidney failure, unspecified: Secondary | ICD-10-CM | POA: Diagnosis present

## 2014-06-03 DIAGNOSIS — M19011 Primary osteoarthritis, right shoulder: Secondary | ICD-10-CM

## 2014-06-03 DIAGNOSIS — E785 Hyperlipidemia, unspecified: Secondary | ICD-10-CM

## 2014-06-03 DIAGNOSIS — M7989 Other specified soft tissue disorders: Secondary | ICD-10-CM

## 2014-06-03 DIAGNOSIS — R609 Edema, unspecified: Secondary | ICD-10-CM

## 2014-06-03 DIAGNOSIS — Z0000 Encounter for general adult medical examination without abnormal findings: Secondary | ICD-10-CM

## 2014-06-03 DIAGNOSIS — E790 Hyperuricemia without signs of inflammatory arthritis and tophaceous disease: Secondary | ICD-10-CM

## 2014-06-03 DIAGNOSIS — T502X5A Adverse effect of carbonic-anhydrase inhibitors, benzothiadiazides and other diuretics, initial encounter: Secondary | ICD-10-CM | POA: Diagnosis present

## 2014-06-03 DIAGNOSIS — R531 Weakness: Secondary | ICD-10-CM

## 2014-06-03 DIAGNOSIS — R5383 Other fatigue: Secondary | ICD-10-CM

## 2014-06-03 DIAGNOSIS — E860 Dehydration: Secondary | ICD-10-CM

## 2014-06-03 DIAGNOSIS — R700 Elevated erythrocyte sedimentation rate: Secondary | ICD-10-CM

## 2014-06-03 DIAGNOSIS — N289 Disorder of kidney and ureter, unspecified: Secondary | ICD-10-CM

## 2014-06-03 DIAGNOSIS — Z79899 Other long term (current) drug therapy: Secondary | ICD-10-CM

## 2014-06-03 DIAGNOSIS — I129 Hypertensive chronic kidney disease with stage 1 through stage 4 chronic kidney disease, or unspecified chronic kidney disease: Secondary | ICD-10-CM | POA: Diagnosis present

## 2014-06-03 DIAGNOSIS — K5900 Constipation, unspecified: Secondary | ICD-10-CM

## 2014-06-03 DIAGNOSIS — Z87891 Personal history of nicotine dependence: Secondary | ICD-10-CM

## 2014-06-03 LAB — COMPREHENSIVE METABOLIC PANEL
ALK PHOS: 54 U/L (ref 39–117)
ALT: 15 U/L (ref 0–53)
AST: 28 U/L (ref 0–37)
Albumin: 2.7 g/dL — ABNORMAL LOW (ref 3.5–5.2)
BILIRUBIN TOTAL: 0.3 mg/dL (ref 0.3–1.2)
BUN: 33 mg/dL — ABNORMAL HIGH (ref 6–23)
CHLORIDE: 98 meq/L (ref 96–112)
CO2: 25 meq/L (ref 19–32)
Calcium: 8.6 mg/dL (ref 8.4–10.5)
Creatinine, Ser: 1.47 mg/dL — ABNORMAL HIGH (ref 0.50–1.35)
GFR calc Af Amer: 49 mL/min — ABNORMAL LOW (ref >90)
GFR calc non Af Amer: 42 mL/min — ABNORMAL LOW (ref >90)
Glucose, Bld: 124 mg/dL — ABNORMAL HIGH (ref 70–99)
Potassium: 3.7 mEq/L (ref 3.7–5.3)
SODIUM: 139 meq/L (ref 137–147)
Total Protein: 6.5 g/dL (ref 6.0–8.3)

## 2014-06-03 LAB — URINALYSIS, ROUTINE W REFLEX MICROSCOPIC
Bilirubin Urine: NEGATIVE
GLUCOSE, UA: NEGATIVE mg/dL
HGB URINE DIPSTICK: NEGATIVE
KETONES UR: NEGATIVE mg/dL
Leukocytes, UA: NEGATIVE
Nitrite: NEGATIVE
Protein, ur: NEGATIVE mg/dL
Specific Gravity, Urine: 1.017 (ref 1.005–1.030)
Urobilinogen, UA: 0.2 mg/dL (ref 0.0–1.0)
pH: 5 (ref 5.0–8.0)

## 2014-06-03 LAB — TROPONIN I: Troponin I: <0.3 ng/mL (ref <0.30)

## 2014-06-03 LAB — I-STAT VENOUS BLOOD GAS, ED
ACID-BASE EXCESS: 5 mmol/L — AB (ref 0.0–2.0)
BICARBONATE: 27.3 meq/L — AB (ref 20.0–24.0)
O2 Saturation: 95 %
TCO2: 28 mmol/L (ref 0–100)
pCO2, Ven: 30.1 mmHg — ABNORMAL LOW (ref 45.0–50.0)
pH, Ven: 7.566 — ABNORMAL HIGH (ref 7.250–7.300)
pO2, Ven: 64 mmHg — ABNORMAL HIGH (ref 30.0–45.0)

## 2014-06-03 LAB — CBC WITH DIFFERENTIAL/PLATELET
BASOS ABS: 0 10*3/uL (ref 0.0–0.1)
Basophils Relative: 0 % (ref 0–1)
Eosinophils Absolute: 0.1 10*3/uL (ref 0.0–0.7)
Eosinophils Relative: 1 % (ref 0–5)
HEMATOCRIT: 33.3 % — AB (ref 39.0–52.0)
Hemoglobin: 10.9 g/dL — ABNORMAL LOW (ref 13.0–17.0)
LYMPHS PCT: 14 % (ref 12–46)
Lymphs Abs: 1.4 10*3/uL (ref 0.7–4.0)
MCH: 31.6 pg (ref 26.0–34.0)
MCHC: 32.7 g/dL (ref 30.0–36.0)
MCV: 96.5 fL (ref 78.0–100.0)
MONO ABS: 1.6 10*3/uL — AB (ref 0.1–1.0)
Monocytes Relative: 16 % — ABNORMAL HIGH (ref 3–12)
NEUTROS ABS: 6.9 10*3/uL (ref 1.7–7.7)
Neutrophils Relative %: 69 % (ref 43–77)
PLATELETS: 269 10*3/uL (ref 150–400)
RBC: 3.45 MIL/uL — ABNORMAL LOW (ref 4.22–5.81)
RDW: 12.5 % (ref 11.5–15.5)
WBC: 10 10*3/uL (ref 4.0–10.5)

## 2014-06-03 LAB — C-REACTIVE PROTEIN: CRP: 12.7 mg/dL — ABNORMAL HIGH (ref <0.60)

## 2014-06-03 LAB — I-STAT CG4 LACTIC ACID, ED: Lactic Acid, Venous: 0.9 mmol/L (ref 0.5–2.2)

## 2014-06-03 LAB — SEDIMENTATION RATE: SED RATE: 72 mm/h — AB (ref 0–16)

## 2014-06-03 LAB — URIC ACID: URIC ACID, SERUM: 9.9 mg/dL — AB (ref 4.0–7.8)

## 2014-06-03 LAB — PHOSPHORUS: Phosphorus: 3.1 mg/dL (ref 2.3–4.6)

## 2014-06-03 LAB — CK: CK TOTAL: 59 U/L (ref 7–232)

## 2014-06-03 LAB — MAGNESIUM: MAGNESIUM: 1.1 mg/dL — AB (ref 1.5–2.5)

## 2014-06-03 LAB — GLUCOSE, CAPILLARY: Glucose-Capillary: 163 mg/dL — ABNORMAL HIGH (ref 70–99)

## 2014-06-03 MED ORDER — VENLAFAXINE HCL ER 75 MG PO CP24
225.0000 mg | ORAL_CAPSULE | Freq: Every day | ORAL | Status: DC
  Administered 2014-06-03 – 2014-06-05 (×3): 225 mg via ORAL
  Filled 2014-06-03 (×4): qty 1

## 2014-06-03 MED ORDER — ASPIRIN 325 MG PO TABS
325.0000 mg | ORAL_TABLET | Freq: Every day | ORAL | Status: DC
  Administered 2014-06-03: 325 mg via ORAL
  Filled 2014-06-03 (×2): qty 1

## 2014-06-03 MED ORDER — LIDOCAINE 5 % EX PTCH
1.0000 | MEDICATED_PATCH | CUTANEOUS | Status: DC
  Administered 2014-06-03 – 2014-06-04 (×2): 1 via TRANSDERMAL
  Filled 2014-06-03 (×3): qty 1

## 2014-06-03 MED ORDER — SODIUM CHLORIDE 0.9 % IV BOLUS (SEPSIS)
1000.0000 mL | Freq: Once | INTRAVENOUS | Status: AC
  Administered 2014-06-03: 1000 mL via INTRAVENOUS

## 2014-06-03 MED ORDER — CARVEDILOL 12.5 MG PO TABS
12.5000 mg | ORAL_TABLET | Freq: Two times a day (BID) | ORAL | Status: DC
  Administered 2014-06-04 – 2014-06-05 (×3): 12.5 mg via ORAL
  Filled 2014-06-03 (×5): qty 1

## 2014-06-03 MED ORDER — PREDNISONE 20 MG PO TABS
20.0000 mg | ORAL_TABLET | Freq: Two times a day (BID) | ORAL | Status: DC
  Administered 2014-06-03 – 2014-06-05 (×4): 20 mg via ORAL
  Filled 2014-06-03 (×6): qty 1

## 2014-06-03 MED ORDER — ATORVASTATIN CALCIUM 20 MG PO TABS
20.0000 mg | ORAL_TABLET | Freq: Every day | ORAL | Status: DC
  Administered 2014-06-03 – 2014-06-05 (×3): 20 mg via ORAL
  Filled 2014-06-03 (×3): qty 1

## 2014-06-03 MED ORDER — VENLAFAXINE HCL ER 225 MG PO TB24: 225.0000 mg | ORAL_TABLET | Freq: Every day | ORAL | Status: DC

## 2014-06-03 MED ORDER — FINASTERIDE 5 MG PO TABS
5.0000 mg | ORAL_TABLET | Freq: Every day | ORAL | Status: DC
  Administered 2014-06-03 – 2014-06-05 (×3): 5 mg via ORAL
  Filled 2014-06-03 (×3): qty 1

## 2014-06-03 MED ORDER — PANTOPRAZOLE SODIUM 40 MG PO TBEC
40.0000 mg | DELAYED_RELEASE_TABLET | Freq: Every day | ORAL | Status: DC
  Administered 2014-06-03 – 2014-06-05 (×3): 40 mg via ORAL
  Filled 2014-06-03 (×3): qty 1

## 2014-06-03 MED ORDER — MAGNESIUM OXIDE 400 (241.3 MG) MG PO TABS
400.0000 mg | ORAL_TABLET | Freq: Two times a day (BID) | ORAL | Status: DC
  Administered 2014-06-03 – 2014-06-05 (×5): 400 mg via ORAL
  Filled 2014-06-03 (×6): qty 1

## 2014-06-03 MED ORDER — STROKE: EARLY STAGES OF RECOVERY BOOK
Freq: Once | Status: AC
  Administered 2014-06-03: 1
  Filled 2014-06-03: qty 1

## 2014-06-03 MED ORDER — SODIUM CHLORIDE 0.9 % IV SOLN
INTRAVENOUS | Status: AC
  Administered 2014-06-03: 75 mL/h via INTRAVENOUS

## 2014-06-03 MED ORDER — MAGNESIUM SULFATE 40 MG/ML IJ SOLN
2.0000 g | Freq: Once | INTRAMUSCULAR | Status: AC
  Administered 2014-06-03: 2 g via INTRAVENOUS
  Filled 2014-06-03: qty 50

## 2014-06-03 MED ORDER — ACETAMINOPHEN 325 MG PO TABS
650.0000 mg | ORAL_TABLET | ORAL | Status: DC | PRN
  Administered 2014-06-03 – 2014-06-04 (×2): 650 mg via ORAL
  Filled 2014-06-03 (×2): qty 2

## 2014-06-03 MED ORDER — DOCUSATE SODIUM 100 MG PO CAPS
200.0000 mg | ORAL_CAPSULE | Freq: Two times a day (BID) | ORAL | Status: DC
  Administered 2014-06-03 – 2014-06-05 (×5): 200 mg via ORAL
  Filled 2014-06-03 (×6): qty 2

## 2014-06-03 MED ORDER — HEPARIN SODIUM (PORCINE) 5000 UNIT/ML IJ SOLN
5000.0000 [IU] | Freq: Three times a day (TID) | INTRAMUSCULAR | Status: DC
  Administered 2014-06-03: 5000 [IU] via SUBCUTANEOUS
  Filled 2014-06-03 (×9): qty 1

## 2014-06-03 NOTE — ED Notes (Signed)
[facility] at bedside. 

## 2014-06-03 NOTE — ED Notes (Signed)
Per EMS pt discovered by GF c/o HA and not making sense with his words; On arrival dysphasia noted by EMS with no facial droop no slurring of words, words were just jumbled; All symptoms cleared up within a few minutes of EMS arrival; c/o generalized weakness; Pt states he has been feeling weak for about one week;

## 2014-06-03 NOTE — ED Notes (Signed)
Dr. Reynolds at bedside.

## 2014-06-03 NOTE — ED Provider Notes (Signed)
CSN: [id number]     Arrival date & time 06/03/14  4098 History   First MD Initiated Contact with Patient 06/03/14 [phone]     Chief Complaint  Patient presents with  . Altered Mental Status     (Consider location/radiation/quality/duration/timing/severity/associated sxs/prior Treatment) HPI Comments: Pt comes in with cc of confusion. Pt has hx of CAD, CKD, HL, HTN. Pt unaware of the events that transpired, that ended up landing him in the ER. Per girl friend, patient woke up to go to the bathroom. Pt c/o weakness and headaches and body aches. Soon after, patient started mumbling and blurring out all sorts of numbers. It was like patient was talking "foreign language." EMS appreciated the same - but patient improved en route. Symptoms lasted for 45 min- 60 min. Pt reports that for the last few days he has been having generalized weakness, which is quite significant. Pt is unable to ambulate as well. He has chronic back pain. Pt has had some new meds at [place]. No hx of strokes. No n/v/f/c/chest pain/dib. + leg and arm swelling, for which patient increased his lasix.  Patient is a 80 y.o. male presenting with altered mental status. The history is provided by the patient and medical records.  Altered Mental Status Presenting symptoms: confusion   Associated symptoms: no fever, no headaches, no light-headedness and no nausea     Past Medical History  Diagnosis Date  . Osteoporosis   . GERD (gastroesophageal reflux disease)   . Hyperlipidemia   . Hypertension   . CAD (coronary artery disease)   . Chronic kidney disease     stones   Past Surgical History  Procedure Laterality Date  . Cardiac valve replacement  2009    pig valve -- AVR  . Coronary artery bypass graft      2009  . Hernia repair     Family History  Problem Relation Age of Onset  . Cancer Mother 36    breast ca  . Heart disease Father     CAD   History  Substance Use Topics  . Smoking status: Former Games developer  . Smokeless  tobacco: Not on file  . Alcohol Use: No    Review of Systems  Constitutional: Negative for fever, chills and activity change.  Eyes: Negative for visual disturbance.  Respiratory: Negative for cough, chest tightness and shortness of breath.   Cardiovascular: Negative for chest pain.  Gastrointestinal: Negative for nausea and abdominal distention.  Genitourinary: Positive for urgency. Negative for dysuria, enuresis and difficulty urinating.  Musculoskeletal: Negative for arthralgias and neck pain.  Neurological: Negative for dizziness, light-headedness and headaches.  Psychiatric/Behavioral: Positive for confusion.      Allergies  Review of patient's allergies indicates no known allergies.  Home Medications   Prior to Admission medications   Medication Sig Start Date End Date Taking? Authorizing Provider  acetaminophen (TYLENOL) 500 MG tablet Take 1,000 mg by mouth every 6 (six) hours as needed for mild pain.   Yes Historical Provider, MD  amLODipine (NORVASC) 10 MG tablet Take 5 mg by mouth daily.    Yes Historical Provider, MD  aspirin 81 MG tablet Take 81 mg by mouth daily.   Yes Historical Provider, MD  atorvastatin (LIPITOR) 20 MG tablet Take 20 mg by mouth daily.   Yes Historical Provider, MD  calcium carbonate (OS-CAL - DOSED IN MG OF ELEMENTAL CALCIUM) 1250 MG tablet Take 1.5 tablets by mouth daily.   Yes Historical Provider, MD  carvedilol (COREG)  12.5 MG tablet Take 12.5 mg by mouth 2 (two) times daily with a meal.   Yes Historical Provider, MD  Cholecalciferol 1000 UNITS tablet Take 1,000 Units by mouth daily.     Yes Historical Provider, MD  colchicine (COLCRYS) 0.6 MG tablet Take 1 tablet (0.6 mg total) by mouth daily. 03/24/14  Yes Judi SaaZachary M Smith, DO  docusate sodium (COLACE) 100 MG capsule Take 200 mg by mouth 2 (two) times daily.   Yes Historical Provider, MD  finasteride (PROSCAR) 5 MG tablet Take 5 mg by mouth daily.   Yes Historical Provider, MD  furosemide  (LASIX) 40 MG tablet Take 40 mg by mouth daily.   Yes Historical Provider, MD  lidocaine (LIDODERM) 5 % Place 1 patch onto the skin daily. Remove & Discard patch within 12 hours or as directed by MD   Yes Historical Provider, MD  Multiple Vitamins-Minerals (OCUVITE PRESERVISION) TABS Take 2 tablets by mouth 2 (two) times daily.     Yes Historical Provider, MD  Omega-3 Fatty Acids (FISH OIL) 1000 MG CAPS Take 2 capsules by mouth 2 (two) times daily.    Yes Historical Provider, MD  omeprazole (PRILOSEC) 20 MG capsule Take 20 mg by mouth daily.     Yes Historical Provider, MD  torsemide (DEMADEX) 100 MG tablet Take 150 mg by mouth daily.   Yes Historical Provider, MD  Venlafaxine HCl 225 MG TB24 Take 225 mg by mouth daily.   Yes Historical Provider, MD   BP 132/64  Pulse 88  Temp(Src) 98.5 F (36.9 C) (Oral)  Resp 21  SpO2 96% Physical Exam  Nursing note and vitals reviewed. Constitutional: He is oriented to person, place, and time. He appears well-developed.  HENT:  Head: Normocephalic and atraumatic.  Eyes: Conjunctivae and EOM are normal. Pupils are equal, round, and reactive to light.  Neck: Normal range of motion. Neck supple. No JVD present.  Cardiovascular: Normal rate and regular rhythm.   Pulmonary/Chest: Effort normal and breath sounds normal.  Abdominal: Soft. Bowel sounds are normal. He exhibits no distension. There is no tenderness. There is no rebound and no guarding.  Neurological: He is alert and oriented to person, place, and time. No cranial nerve deficit. Coordination normal.   Sensory exam normal for bilateral upper and lower extremities - and patient is able to discriminate between sharp and dull. Motor exam is 4+/5   Skin: Skin is warm.    ED Course  Procedures (including critical care time) Labs Review Labs Reviewed  CBC WITH DIFFERENTIAL - Abnormal; Notable for the following:    RBC 3.45 (*)    Hemoglobin 10.9 (*)    HCT 33.3 (*)    Monocytes Relative 16  (*)    Monocytes Absolute 1.6 (*)    All other components within normal limits  COMPREHENSIVE METABOLIC PANEL - Abnormal; Notable for the following:    Glucose, Bld 124 (*)    BUN 33 (*)    Creatinine, Ser 1.47 (*)    Albumin 2.7 (*)    GFR calc non Af Amer 42 (*)    GFR calc Af Amer 49 (*)    All other components within normal limits  URINALYSIS, ROUTINE W REFLEX MICROSCOPIC - Abnormal; Notable for the following:    APPearance HAZY (*)    All other components within normal limits  MAGNESIUM - Abnormal; Notable for the following:    Magnesium 1.1 (*)    All other components within normal limits  I-STAT VENOUS  BLOOD GAS, ED - Abnormal; Notable for the following:    pH, Ven 7.566 (*)    pCO2, Ven 30.1 (*)    pO2, Ven 64.0 (*)    Bicarbonate 27.3 (*)    Acid-Base Excess 5.0 (*)    All other components within normal limits  URINE CULTURE  TROPONIN I  PHOSPHORUS  I-STAT CG4 LACTIC ACID, ED    Imaging Review Dg Chest 2 View  06/03/2014   CLINICAL DATA:  Chest pain, weakness, body aches  EXAM: CHEST  2 VIEW  COMPARISON:  Prior chest x-ray 02/03/2014  FINDINGS: Very low inspiratory volumes with bibasilar subsegmental atelectasis. Cardiac and mediastinal contours are unchanged. There is mild cardiomegaly and atherosclerosis of the tortuous thoracic aorta. 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 The mid and upper lungs are clear without evidence of a pneumothorax or pleural effusion. Background bronchitic changes and interstitial prominence are similar compared to prior. No acute osseous abnormality. Incompletely imaged thoracolumbar compression fracture with changes of prior vertebroplasty.  IMPRESSION: Very low inspiratory volumes with bibasilar subsegmental atelectasis.  Otherwise, stable chest x-ray without evidence of acute cardiopulmonary disease.   Electronically Signed   By: Malachy Moan M.D.   On: 06/03/2014 08:49   Ct Head Wo Contrast  06/03/2014    CLINICAL DATA:  Headache, dysphagia, weakness  EXAM: CT HEAD WITHOUT CONTRAST  TECHNIQUE: Contiguous axial images were obtained from the base of the skull through the vertex without intravenous contrast.  COMPARISON:  None.  FINDINGS: No evidence of parenchymal hemorrhage or extra-axial fluid collection. No mass lesion, mass effect, or midline shift.  No CT evidence of acute infarction.  Subcortical white matter and periventricular small vessel ischemic changes.  Age related atrophy.  No ventriculomegaly.  The visualized paranasal sinuses are essentially clear. The mastoid air cells are unopacified.  No evidence of calvarial fracture.  IMPRESSION: No evidence of acute intracranial abnormality.  Atrophy with small vessel ischemic changes.   Electronically Signed   By: Charline Bills M.D.   On: 06/03/2014 08:29     EKG Interpretation   Date/Time:  Saturday June 03 2014 07:18:23 EDT Ventricular Rate:  91 PR Interval:  194 QRS Duration: 137 QT Interval:  378 QTC Calculation: 465 R Axis:   -63 Text Interpretation:  Sinus rhythm PVC RBBB and LAFB Confirmed by  Armas Mcbee, MD, Janey Genta [phone]) on 06/03/2014 7:29:40 AM      MDM   Final diagnoses:  Hypomagnesemia  TIA (transient ischemic attack)  CKD (chronic kidney disease)  Metabolic alkalosis    Pt comes in with generalized weakness, malaise and swelling. Labs are mostly unremarkable, except for some mild metabolic derangements. Cardiac and infectious screens are normal as well. Swelling could be due to oral pred, and the weakness from adrenal insufficiency from the weaning , however, labs dont support the latter.   Pt also had, what appears to be an event of aphasia. ? TIA. ABCD2 score is 3. Neuro consulted - and we will admit.    Derwood Kaplan, MD 06/03/14 1058

## 2014-06-03 NOTE — H&P (Signed)
[facility] History and Physical  Adam Weeks DXA:[id number] DOB: 1927/12/13 DOA: 06/03/2014   PCP: Walker Kehr, MD  Specialists: Patient is also followed at the [facility] in [place]. He is followed by orthopedic Dr. Ernestina Patches. He's also followed by a nephrologist.  Chief Complaint: Garbled speech and altered mental status this morning  HPI: Adam Weeks is a 80 y.o. male with a past medical history of coronary artery disease, status post CABG, history of aortic valve replacement, most likely bioprosthetic valve not on anticoagulation, hypertension, gout, chronic back pain, who was in his usual state of health at about 4 AM this morning when his girlfriend noted that he was not acting as he usually does. He was somewhat lethargic. He had been to the bathroom and had to be assisted back to the bedroom. There is no history of falls. No recent illness. No fever. No chills. Patient appeared to be confused to the girlfriend. He started complaining of headache in the back of his head and in back of his neck. He then started reciting random numbers. This concerned the girlfriend and she called the patient's son, who told her to call EMS. EMS was called and by the time they got to the house patient had garbled speech. CBG was checked and was normal. Within a few minutes of this his symptoms started improving and by the time EMS brought him to the emergency department he was back to his baseline. He denied any focal weakness. Patient cannot recall these events. According to the son, a similar episode happened back in October of 2014, which was thought to be related to medications, and narcotics. However, at that time he took about 24-36 hours to cover.  Patient also tells me that he was placed on prednisone about 6 months ago. Review of his records suggest that he was started on prednisone for back pain. After discussions with his nephrologist a few months ago he started tapering the dose. He tapered it  down and has been off of prednisone for the last 2 weeks. Patient was also started on a new medication, most likely Allopurinol, recently. He has taken only one dose of the same. He also mentions, that over the last few weeks he has had generalized muscular pain. He's had generalized weakness. And in the last few days he has also noticed pain in his right wrist and has had difficulty moving it.  Home Medications: This list may not be accurate. Family asked to bring in his medication bottles. Prior to Admission medications   Medication Sig Start Date End Date Taking? Authorizing Provider  acetaminophen (TYLENOL) 500 MG tablet Take 1,000 mg by mouth every 6 (six) hours as needed for mild pain.   Yes Historical Provider, MD  allopurinol (ZYLOPRIM) 100 MG tablet Take 50 mg by mouth daily.   Yes Historical Provider, MD  amLODipine (NORVASC) 10 MG tablet Take 5 mg by mouth daily.    Yes Historical Provider, MD  aspirin 81 MG tablet Take 81 mg by mouth daily.   Yes Historical Provider, MD  atorvastatin (LIPITOR) 20 MG tablet Take 20 mg by mouth daily.   Yes Historical Provider, MD  calcium carbonate (OS-CAL - DOSED IN MG OF ELEMENTAL CALCIUM) 1250 MG tablet Take 1.5 tablets by mouth daily.   Yes Historical Provider, MD  carvedilol (COREG) 12.5 MG tablet Take 12.5 mg by mouth 2 (two) times daily with a meal.   Yes Historical Provider, MD  Cholecalciferol 1000 UNITS tablet Take  1,000 Units by mouth daily.     Yes Historical Provider, MD  colchicine (COLCRYS) 0.6 MG tablet Take 1 tablet (0.6 mg total) by mouth daily. 03/24/14  Yes Lyndal Pulley, DO  docusate sodium (COLACE) 100 MG capsule Take 200 mg by mouth 2 (two) times daily.   Yes Historical Provider, MD  finasteride (PROSCAR) 5 MG tablet Take 5 mg by mouth daily.   Yes Historical Provider, MD  furosemide (LASIX) 40 MG tablet Take 40 mg by mouth daily.   Yes Historical Provider, MD  lidocaine (LIDODERM) 5 % Place 1 patch onto the skin daily. Remove &  Discard patch within 12 hours or as directed by MD   Yes Historical Provider, MD  Multiple Vitamins-Minerals (OCUVITE PRESERVISION) TABS Take 2 tablets by mouth 2 (two) times daily.     Yes Historical Provider, MD  omeprazole (PRILOSEC) 20 MG capsule Take 20 mg by mouth daily.     Yes Historical Provider, MD  Venlafaxine HCl 225 MG TB24 Take 225 mg by mouth daily.   Yes Historical Provider, MD    Allergies: No Known Allergies  Past Medical History: Past Medical History  Diagnosis Date  . Osteoporosis   . GERD (gastroesophageal reflux disease)   . Hyperlipidemia   . Hypertension   . CAD (coronary artery disease)   . Chronic kidney disease     stones    Past Surgical History  Procedure Laterality Date  . Cardiac valve replacement  2009    pig valve -- AVR  . Coronary artery bypass graft      2009  . Hernia repair      Social History: Patient lives with his girlfriend in [place]. He quit smoking 45 years ago. No alcohol use. He uses a walker to ambulate.  Family History:  Family History  Problem Relation Age of Onset  . Cancer Mother 23    breast ca  . Heart disease Father     CAD     Review of Systems - unable to obtain due to confusion  Physical Examination  Filed Vitals:   06/03/14 1100 06/03/14 1115 06/03/14 1130 06/03/14 1254  BP: 120/56 120/57 120/57 122/62  Pulse: 88 44 83 80  Temp:    98.1 F (36.7 C)  TempSrc:      Resp: 23 17 17 18   SpO2: 88% 94% 92% 96%    BP 122/62  Pulse 80  Temp(Src) 98.1 F (36.7 C) (Oral)  Resp 18  SpO2 96%  General appearance: alert, cooperative, appears stated age, distracted and no distress Head: Normocephalic, without obvious abnormality, atraumatic Eyes: conjunctivae/corneas clear. PERRL, EOM's intact. Throat: lips, mucosa, and tongue normal; teeth and gums normal Neck: no adenopathy, no carotid bruit, no JVD, supple, symmetrical, trachea midline and thyroid not enlarged, symmetric, no  tenderness/mass/nodules Resp: clear to auscultation bilaterally Cardio: regular rate and rhythm, S1, S2 normal, no murmur, click, rub or gallop GI: soft, non-tender; bowel sounds normal; no masses,  no organomegaly Extremities: extremities normal, atraumatic, no cyanosis or edema Pulses: 2+ and symmetric Skin: Skin color, texture, turgor normal. No rashes or lesions Lymph nodes: Cervical, supraclavicular, and axillary nodes normal. Neurologic: He is alert. Oriented to place, person. No cranial nerve deficits. Motor strength is equal bilateral lower Extremities. He has chronic right shoulder pain and so full neurological assessment could not be performed in the upper extremities. But no focal deficits noted. Gait was not assessed.  Laboratory Data: Results for orders placed during the hospital  encounter of 06/03/14 (from the past 48 hour(s))  CBC WITH DIFFERENTIAL     Status: Abnormal   Collection Time    06/03/14  7:50 AM      Result Value Ref Range   WBC 10.0  4.0 - 10.5 K/uL   RBC 3.45 (*) 4.22 - 5.81 MIL/uL   Hemoglobin 10.9 (*) 13.0 - 17.0 g/dL   HCT 33.3 (*) 39.0 - 52.0 %   MCV 96.5  78.0 - 100.0 fL   MCH 31.6  26.0 - 34.0 pg   MCHC 32.7  30.0 - 36.0 g/dL   RDW 12.5  11.5 - 15.5 %   Platelets 269  150 - 400 K/uL   Neutrophils Relative % 69  43 - 77 %   Neutro Abs 6.9  1.7 - 7.7 K/uL   Lymphocytes Relative 14  12 - 46 %   Lymphs Abs 1.4  0.7 - 4.0 K/uL   Monocytes Relative 16 (*) 3 - 12 %   Monocytes Absolute 1.6 (*) 0.1 - 1.0 K/uL   Eosinophils Relative 1  0 - 5 %   Eosinophils Absolute 0.1  0.0 - 0.7 K/uL   Basophils Relative 0  0 - 1 %   Basophils Absolute 0.0  0.0 - 0.1 K/uL  COMPREHENSIVE METABOLIC PANEL     Status: Abnormal   Collection Time    06/03/14  7:50 AM      Result Value Ref Range   Sodium 139  137 - 147 mEq/L   Potassium 3.7  3.7 - 5.3 mEq/L   Chloride 98  96 - 112 mEq/L   CO2 25  19 - 32 mEq/L   Glucose, Bld 124 (*) 70 - 99 mg/dL   BUN 33 (*) 6 - 23  mg/dL   Creatinine, Ser 1.47 (*) 0.50 - 1.35 mg/dL   Calcium 8.6  8.4 - 10.5 mg/dL   Total Protein 6.5  6.0 - 8.3 g/dL   Albumin 2.7 (*) 3.5 - 5.2 g/dL   AST 28  0 - 37 U/L   ALT 15  0 - 53 U/L   Alkaline Phosphatase 54  39 - 117 U/L   Total Bilirubin 0.3  0.3 - 1.2 mg/dL   GFR calc non Af Amer 42 (*) >90 mL/min   GFR calc Af Amer 49 (*) >90 mL/min   Comment: (NOTE)     The eGFR has been calculated using the CKD EPI equation.     This calculation has not been validated in all clinical situations.     eGFR's persistently <90 mL/min signify possible Chronic Kidney     Disease.  TROPONIN I     Status: None   Collection Time    06/03/14  7:50 AM      Result Value Ref Range   Troponin I <0.30  <0.30 ng/mL   Comment:            Due to the release kinetics of cTnI,     a negative result within the first hours     of the onset of symptoms does not rule out     myocardial infarction with certainty.     If myocardial infarction is still suspected,     repeat the test at appropriate intervals.  MAGNESIUM     Status: Abnormal   Collection Time    06/03/14  7:50 AM      Result Value Ref Range   Magnesium 1.1 (*) 1.5 - 2.5 mg/dL  PHOSPHORUS     Status: None   Collection Time    06/03/14  7:50 AM      Result Value Ref Range   Phosphorus 3.1  2.3 - 4.6 mg/dL  I-STAT VENOUS BLOOD GAS, ED     Status: Abnormal   Collection Time    06/03/14  8:01 AM      Result Value Ref Range   pH, Ven 7.566 (*) 7.250 - 7.300   pCO2, Ven 30.1 (*) 45.0 - 50.0 mmHg   pO2, Ven 64.0 (*) 30.0 - 45.0 mmHg   Bicarbonate 27.3 (*) 20.0 - 24.0 mEq/L   TCO2 28  0 - 100 mmol/L   O2 Saturation 95.0     Acid-Base Excess 5.0 (*) 0.0 - 2.0 mmol/L   Sample type VENOUS    I-STAT CG4 LACTIC ACID, ED     Status: None   Collection Time    06/03/14  8:02 AM      Result Value Ref Range   Lactic Acid, Venous 0.90  0.5 - 2.2 mmol/L  URINALYSIS, ROUTINE W REFLEX MICROSCOPIC     Status: Abnormal   Collection Time     06/03/14  9:23 AM      Result Value Ref Range   Color, Urine YELLOW  YELLOW   APPearance HAZY (*) CLEAR   Specific Gravity, Urine 1.017  1.005 - 1.030   pH 5.0  5.0 - 8.0   Glucose, UA NEGATIVE  NEGATIVE mg/dL   Hgb urine dipstick NEGATIVE  NEGATIVE   Bilirubin Urine NEGATIVE  NEGATIVE   Ketones, ur NEGATIVE  NEGATIVE mg/dL   Protein, ur NEGATIVE  NEGATIVE mg/dL   Urobilinogen, UA 0.2  0.0 - 1.0 mg/dL   Nitrite NEGATIVE  NEGATIVE   Leukocytes, UA NEGATIVE  NEGATIVE   Comment: MICROSCOPIC NOT DONE ON URINES WITH NEGATIVE PROTEIN, BLOOD, LEUKOCYTES, NITRITE, OR GLUCOSE <1000 mg/dL.    Radiology Reports: Dg Chest 2 View  06/03/2014   CLINICAL DATA:  Chest pain, weakness, body aches  EXAM: CHEST  2 VIEW  COMPARISON:  Prior chest x-ray 02/03/2014  FINDINGS: Very low inspiratory volumes with bibasilar subsegmental atelectasis. Cardiac and mediastinal contours are unchanged. There is mild cardiomegaly and atherosclerosis of the tortuous thoracic aorta. 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 The mid and upper lungs are clear without evidence of a pneumothorax or pleural effusion. Background bronchitic changes and interstitial prominence are similar compared to prior. No acute osseous abnormality. Incompletely imaged thoracolumbar compression fracture with changes of prior vertebroplasty.  IMPRESSION: Very low inspiratory volumes with bibasilar subsegmental atelectasis.  Otherwise, stable chest x-ray without evidence of acute cardiopulmonary disease.   Electronically Signed   By: Jacqulynn Cadet M.D.   On: 06/03/2014 08:49   Ct Head Wo Contrast  06/03/2014   CLINICAL DATA:  Headache, dysphagia, weakness  EXAM: CT HEAD WITHOUT CONTRAST  TECHNIQUE: Contiguous axial images were obtained from the base of the skull through the vertex without intravenous contrast.  COMPARISON:  None.  FINDINGS: No evidence of parenchymal hemorrhage or extra-axial fluid collection. No mass  lesion, mass effect, or midline shift.  No CT evidence of acute infarction.  Subcortical white matter and periventricular small vessel ischemic changes.  Age related atrophy.  No ventriculomegaly.  The visualized paranasal sinuses are essentially clear. The mastoid air cells are unopacified.  No evidence of calvarial fracture.  IMPRESSION: No evidence of acute intracranial abnormality.  Atrophy with small vessel ischemic changes.  Electronically Signed   By: Julian Hy M.D.   On: 06/03/2014 08:29    Electrocardiogram: Sinus rhythm at 91 beats per minute. Left axis deviation. PACs and PVCs are noted. No Q waves. Intervals are normal. No concerning ST or T-wave changes are noted.  Problem List  Principal Problem:   TIA (transient ischemic attack) Active Problems:   CAD (coronary artery disease)   S/P AVR (aortic valve replacement)   Hypomagnesemia   Metabolic alkalosis   Dehydration   Generalized weakness   Acute gout   Assessment: This is a 80 year old, Caucasian male, who presents with a transient episode of confusion, and garbled speech. Differential diagnoses includes TIA, medication induced, seizure activity, atypical migraine. No obvious infectious source is identified. He is also appears to have acute gout involving his wrist. Also has generalized weakness and generalized body aches. Polymyalgia rheumatica should be considered in the differential. He was not considered for TPA because he had resolution of his symptoms.  Plan: #1 questionable TIA: He'll be admitted to the hospital. TIA workup will be initiated. Neurology consultation will be obtained. He reports a history of spacers placed in his lower back many years ago. Discussed with the radiologist who will further investigate, but thinks that MRI should not be a problem. Carotid Dopplers and echocardiogram will also be ordered. EEG will be obtained. PT and OT as along with speech therapist will be consulted.  #2 generalized  body ache with weakness: Polymyalgia rheumatica is in the differential. Recent steroid use could have contributed as well. We will check ESR and CRP. Total CK level will also be checked.  #3 acute gout involving right wrist: Check uric acid level. Unfortunately, unable to use NSAIDs due to his renal insufficiency. Even though we would not like to use steroids this may be the only option for symptom relief. Prednisone will be given. I have asked the family to bring in all of his medication bottles as it appears, that the medication list may not be accurate. Especially told him to bring in the bottle of the most recent medication that was prescribed, which is thought to be Allopurinol.  #4 mild acute renal failure secondary to dehydration. He has also been taking a higher dose of Lasix for pedal edema. This could have also contributed to his renal insufficiency. Lasix will be held for now. He'll be given gentle IV hydration. Renal function will be monitored closely. Monitor urine output.  #5 hypomagnesemia: This has been repleted intravenously. Magnesium oxide will be prescribed. Repeat level in the morning.  #6 history of coronary artery disease, and aortic valve replacement: These issues appear to be stable. Continue aspirin. Continue statin. PVCs and PACs noted on EKG. Unclear if he takes beta blockers. He might benefit from same.  #7 history of chronic back pain: Avoid narcotics as much as possible considering that he has history of mental status changes due to opioids.  #8 normocytic anemia: Appears to be close to baseline. Monitor.  DVT Prophylaxis: Heparin Code Status: Full code Family Communication: Discussed with the patient, his girlfriend and his son  Disposition Plan: Admit to telemetry   Further management decisions will depend on results of further testing and patient's response to treatment.   Bonnielee Haff  [facility] Pager [phone]  If 7PM-7AM, please contact  night-coverage [email or web address] Password [facility]  06/03/2014, 2:41 PM  Disclaimer: This note was dictated with voice recognition software. Similar sounding words can inadvertently be transcribed and may not be corrected upon review.

## 2014-06-03 NOTE — Consult Note (Signed)
Referring Physician: Kathrynn Humble    Chief Complaint: Altered mental status  HPI: Adam Weeks is an 80 y.o. male who has chronic back pain and for the past 1-2 weeks has also had complaints of weakness and diffuse (particularly upper extremity) pain as well.  Patient is amnestic of the events of last night history must be obtained from his girlfriend.  It seems that after going to sleep the patient awakened at about 430AM to use the bathroom.  He returned slowly and had difficulty getting back into bed due to weakness.  His girlfriend helped him into bed and he was complaining of a bad headache.  Patient then began to speak unintelligible.  He began to repeat numbers.  Started to complain of voices that he seemed to be hearing.  Soon developed into complete nonsensible speech.  EMS was called and the patient did not improve dramatically.  There patient was brought in for evaluation at that time.  He is now back to baseline.  Per family report the episode lasted about 2 hours.   Patient has a history of chronic pain and is seen at the [facility] hospital.  It seems that due his abuse of narcotics they stopped prescribing them in October of 2014.  The patient has some left over medications though.  The patient does not admit to taking excessive medication at that time.  He reports they stopped prescribing him his medications because he was taking too little.  He admits to taking some of his left over medication now and from questioning it is clear that he does not know exactly what he is taking or how much. Patient has not been eating and drinking normally.    Date last known well: Date: 06/03/2014 Time last known well: Time: 04:30 tPA Given: No: Resolution of symptoms  Past Medical History  Diagnosis Date  . Osteoporosis   . GERD (gastroesophageal reflux disease)   . Hyperlipidemia   . Hypertension   . CAD (coronary artery disease)   . Chronic kidney disease     stones    Past Surgical History  Procedure  Laterality Date  . Cardiac valve replacement  2009    pig valve -- AVR  . Coronary artery bypass graft      2009  . Hernia repair      Family History  Problem Relation Age of Onset  . Cancer Mother 41    breast ca  . Heart disease Father     CAD   Social History:  reports that he has quit smoking. He does not have any smokeless tobacco history on file. He reports that he does not drink alcohol or use illicit drugs.  Allergies: No Known Allergies  Medications: I have reviewed the patient's current medications. Prior to Admission:  Current outpatient prescriptions: acetaminophen (TYLENOL) 500 MG tablet, Take 1,000 mg by mouth every 6 (six) hours as needed for mild pain., Disp: , Rfl: ;   amLODipine (NORVASC) 10 MG tablet, Take 5 mg by mouth daily. , Disp: , Rfl: ;   aspirin 81 MG tablet, Take 81 mg by mouth daily., Disp: , Rfl: ;  atorvastatin (LIPITOR) 20 MG tablet, Take 20 mg by mouth daily., Disp: , Rfl:  calcium carbonate (OS-CAL - DOSED IN MG OF ELEMENTAL CALCIUM) 1250 MG tablet, Take 1.5 tablets by mouth daily., Disp: , Rfl: ;  carvedilol (COREG) 12.5 MG tablet, Take 12.5 mg by mouth 2 (two) times daily with a meal., Disp: , Rfl: ;  Cholecalciferol 1000 UNITS tablet, Take 1,000 Units by mouth daily.  , Disp: , Rfl: ;   colchicine (COLCRYS) 0.6 MG tablet, Take 1 tablet (0.6 mg total) by mouth daily., Disp: 30 tablet, Rfl: 3 docusate sodium (COLACE) 100 MG capsule, Take 200 mg by mouth 2 (two) times daily., Disp: , Rfl: ;   finasteride (PROSCAR) 5 MG tablet, Take 5 mg by mouth daily., Disp: , Rfl: ;   furosemide (LASIX) 40 MG tablet, Take 40 mg by mouth daily., Disp: , Rfl: ;   lidocaine (LIDODERM) 5 %, Place 1 patch onto the skin daily. Remove & Discard patch within 12 hours or as directed by MD, Disp: , Rfl:  Multiple Vitamins-Minerals (OCUVITE PRESERVISION) TABS, Take 2 tablets by mouth 2 (two) times daily.  , Disp: , Rfl: ;   omeprazole (PRILOSEC) 20 MG capsule, Take 20 mg by  mouth daily.  , Disp: , Rfl: ;   Venlafaxine HCl 225 MG TB24, Take 225 mg by mouth daily., Disp: , Rfl:   ROS: History obtained from the patient  General ROS: negative for - chills, fatigue, fever, night sweats, weight gain or weight loss Psychological ROS: negative for - behavioral disorder, hallucinations, memory difficulties, mood swings or suicidal ideation Ophthalmic ROS: negative for - blurry vision, double vision, eye pain or loss of vision ENT ROS: negative for - epistaxis, nasal discharge, oral lesions, sore throat, tinnitus or vertigo Allergy and Immunology ROS: negative for - hives or itchy/watery eyes Hematological and Lymphatic ROS: negative for - bleeding problems, bruising or swollen lymph nodes Endocrine ROS: negative for - galactorrhea, hair pattern changes, polydipsia/polyuria or temperature intolerance Respiratory ROS: negative for - cough, hemoptysis, shortness of breath or wheezing Cardiovascular ROS: negative for - chest pain, dyspnea on exertion, edema or irregular heartbeat Gastrointestinal ROS: negative for - abdominal pain, diarrhea, hematemesis, nausea/vomiting or stool incontinence Genito-Urinary ROS: negative for - dysuria, hematuria, incontinence or urinary frequency/urgency Musculoskeletal ROS: as noted in HPI Neurological ROS: as noted in HPI Dermatological ROS: negative for rash and skin lesion changes  Physical Examination: Blood pressure 120/57, pulse 83, temperature 98.5 F (36.9 C), temperature source Oral, resp. rate 17, SpO2 92.00%.  Neurologic Examination: Mental Status: Alert, oriented, thought content appropriate.  Speech fluent without evidence of aphasia.  Able to follow 3 step commands without difficulty. Cranial Nerves: II: Discs flat bilaterally; Visual fields grossly normal, pupils equal, round, reactive to light and accommodation III,IV, VI: ptosis not present, extra-ocular motions intact bilaterally V,VII: smile symmetric, facial light  touch sensation normal bilaterally VIII: hearing normal bilaterally IX,X: gag reflex present XI: bilateral shoulder shrug XII: midline tongue extension Motor: Patient able to lift LUE above his head easily.  Has restricted ROM at the right shoulder.  Has normal range at the elbow.  Hand grip 4-/5 in the hands bilaterally.   Tone and bulk:normal tone throughout; no atrophy noted Sensory: Pinprick and light touch intact throughout, bilaterally.  Patient complains of pain on even light touch of the upper extremities.   Deep Tendon Reflexes: 2+ and symmetric throughout Plantars: Right: downgoing   Left: downgoing Cerebellar: normal finger-to-nose and normal heel-to-shin test Gait: Unable to test CV: pulses palpable throughout     Laboratory Studies:  Basic Metabolic Panel:  Recent Labs Lab 06/03/14 0750  NA 139  K 3.7  CL 98  CO2 25  GLUCOSE 124*  BUN 33*  CREATININE 1.47*  CALCIUM 8.6  MG 1.1*  PHOS 3.1    Liver Function Tests:  Recent Labs Lab 06/03/14 0750  AST 28  ALT 15  ALKPHOS 54  BILITOT 0.3  PROT 6.5  ALBUMIN 2.7*   No results found for this basename: LIPASE, AMYLASE,  in the last 168 hours No results found for this basename: AMMONIA,  in the last 168 hours  CBC:  Recent Labs Lab 06/03/14 0750  WBC 10.0  NEUTROABS 6.9  HGB 10.9*  HCT 33.3*  MCV 96.5  PLT 269    Cardiac Enzymes:  Recent Labs Lab 06/03/14 0750  TROPONINI <0.30    BNP: No components found with this basename: POCBNP,   CBG: No results found for this basename: GLUCAP,  in the last 168 hours  Microbiology: Results for orders placed during the hospital encounter of 11/19/09  CULTURE, BLOOD (ROUTINE X 2)     Status: None   Collection Time    11/20/09  5:40 AM      Result Value Ref Range Status   Specimen Description BLOOD LEFT ARM   Final   Special Requests BOTTLES DRAWN AEROBIC AND ANAEROBIC 10CC EACH   Final   Culture NO GROWTH 5 DAYS   Final   Report Status  11/26/2009 FINAL   Final  CULTURE, BLOOD (ROUTINE X 2)     Status: None   Collection Time    11/20/09  5:45 AM      Result Value Ref Range Status   Specimen Description BLOOD LEFT ARM   Final   Special Requests BOTTLES DRAWN AEROBIC ONLY 10CC   Final   Culture NO GROWTH 5 DAYS   Final   Report Status 11/26/2009 FINAL   Final    Coagulation Studies: No results found for this basename: LABPROT, INR,  in the last 72 hours  Urinalysis:  Recent Labs Lab 06/03/14 0923  COLORURINE YELLOW  LABSPEC 1.017  PHURINE 5.0  GLUCOSEU NEGATIVE  HGBUR NEGATIVE  BILIRUBINUR NEGATIVE  KETONESUR NEGATIVE  PROTEINUR NEGATIVE  UROBILINOGEN 0.2  NITRITE NEGATIVE  LEUKOCYTESUR NEGATIVE    Lipid Panel:    Component Value Date/Time   CHOL 86 04/11/2013 1615   TRIG 89.0 04/11/2013 1615   HDL 26.90* 04/11/2013 1615   CHOLHDL 3 04/11/2013 1615   VLDL 17.8 04/11/2013 1615   LDLCALC 41 04/11/2013 1615    HgbA1C:  No results found for this basename: HGBA1C    Urine Drug Screen:   No results found for this basename: labopia, cocainscrnur, labbenz, amphetmu, thcu, labbarb    Alcohol Level: No results found for this basename: ETH,  in the last 168 hours  Other results: EKG: normal sinus rhythm at 91 bpm  Imaging: Dg Chest 2 View  06/03/2014   CLINICAL DATA:  Chest pain, weakness, body aches  EXAM: CHEST  2 VIEW  COMPARISON:  Prior chest x-ray 02/03/2014  FINDINGS: Very low inspiratory volumes with bibasilar subsegmental atelectasis. Cardiac and mediastinal contours are unchanged. There is mild cardiomegaly and atherosclerosis of the tortuous thoracic aorta. 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 The mid and upper lungs are clear without evidence of a pneumothorax or pleural effusion. Background bronchitic changes and interstitial prominence are similar compared to prior. No acute osseous abnormality. Incompletely imaged thoracolumbar compression fracture with changes of  prior vertebroplasty.  IMPRESSION: Very low inspiratory volumes with bibasilar subsegmental atelectasis.  Otherwise, stable chest x-ray without evidence of acute cardiopulmonary disease.   Electronically Signed   By: Jacqulynn Cadet M.D.   On: 06/03/2014 08:49   Ct Head  Wo Contrast  06/03/2014   CLINICAL DATA:  Headache, dysphagia, weakness  EXAM: CT HEAD WITHOUT CONTRAST  TECHNIQUE: Contiguous axial images were obtained from the base of the skull through the vertex without intravenous contrast.  COMPARISON:  None.  FINDINGS: No evidence of parenchymal hemorrhage or extra-axial fluid collection. No mass lesion, mass effect, or midline shift.  No CT evidence of acute infarction.  Subcortical white matter and periventricular small vessel ischemic changes.  Age related atrophy.  No ventriculomegaly.  The visualized paranasal sinuses are essentially clear. The mastoid air cells are unopacified.  No evidence of calvarial fracture.  IMPRESSION: No evidence of acute intracranial abnormality.  Atrophy with small vessel ischemic changes.   Electronically Signed   By: Julian Hy M.D.   On: 06/03/2014 08:29    Assessment: 80 y.o. male presenting after an episode of altered speech.  There did not appear to be any focality otherwise.  Head CT reviewed and shows no acute changes.  There appears to be an issue with pain medications as well.  Although this event may have been a TIA can not rule out the possibility that this may have been related to medication use.  Further work up recommended.   Patient's pain appears to be at the center of everything.  Based on his complaints can not rule out the possibility of PMR.  Work up for this should be initiated.    Stroke Risk Factors - hyperlipidemia and hypertension  Plan: 1. HgbA1c, fasting lipid panel, ESR, CK, UA 2. MRI, MRA  of the brain without contrast 3. PT consult, OT consult, Speech consult 4. Echocardiogram 5. Carotid dopplers 6. Prophylactic  therapy-Patient to continue on an aspirin a day 7. Limit narcotic usage 8. Telemetry monitoring 9. Frequent neuro checks   Alexis Goodell, MD [facility] [phone] 06/03/2014, 12:34 PM

## 2014-06-03 NOTE — Plan of Care (Signed)
Problem: Acute Treatment Outcomes Goal: Other Acute Treatment Outcomes Outcome: Progressing Pt biggest c/o of generalized weakness, generalized joint pain

## 2014-06-04 ENCOUNTER — Inpatient Hospital Stay (HOSPITAL_COMMUNITY): Payer: Medicare Other

## 2014-06-04 DIAGNOSIS — M19019 Primary osteoarthritis, unspecified shoulder: Secondary | ICD-10-CM

## 2014-06-04 DIAGNOSIS — N183 Chronic kidney disease, stage 3 unspecified: Secondary | ICD-10-CM

## 2014-06-04 DIAGNOSIS — I2510 Atherosclerotic heart disease of native coronary artery without angina pectoris: Secondary | ICD-10-CM

## 2014-06-04 DIAGNOSIS — R700 Elevated erythrocyte sedimentation rate: Secondary | ICD-10-CM

## 2014-06-04 DIAGNOSIS — I369 Nonrheumatic tricuspid valve disorder, unspecified: Secondary | ICD-10-CM

## 2014-06-04 LAB — GLUCOSE, CAPILLARY
Glucose-Capillary: 164 mg/dL — ABNORMAL HIGH (ref 70–99)
Glucose-Capillary: 150 mg/dL — ABNORMAL HIGH (ref 70–99)
Glucose-Capillary: 142 mg/dL — ABNORMAL HIGH (ref 70–99)

## 2014-06-04 LAB — LIPID PANEL
CHOL/HDL RATIO: 3.7 ratio
Cholesterol: 115 mg/dL (ref 0–200)
HDL: 31 mg/dL — AB (ref >39)
LDL CALC: 62 mg/dL (ref 0–99)
Triglycerides: 110 mg/dL (ref <150)
VLDL: 22 mg/dL (ref 0–40)

## 2014-06-04 LAB — HEMOGLOBIN A1C
Hgb A1c MFr Bld: 5.9 % — ABNORMAL HIGH (ref <5.7)
Mean Plasma Glucose: 123 mg/dL — ABNORMAL HIGH (ref <117)

## 2014-06-04 LAB — URINE CULTURE
COLONY COUNT: NO GROWTH
CULTURE: NO GROWTH

## 2014-06-04 LAB — MAGNESIUM: Magnesium: 1.7 mg/dL (ref 1.5–2.5)

## 2014-06-04 MED ORDER — MAGNESIUM SULFATE 40 MG/ML IJ SOLN
2.0000 g | Freq: Once | INTRAMUSCULAR | Status: AC
  Administered 2014-06-04: 2 g via INTRAVENOUS
  Filled 2014-06-04: qty 50

## 2014-06-04 MED ORDER — COLCHICINE 0.6 MG PO TABS
0.6000 mg | ORAL_TABLET | Freq: Every day | ORAL | Status: DC
  Administered 2014-06-04 – 2014-06-05 (×2): 0.6 mg via ORAL
  Filled 2014-06-04 (×2): qty 1

## 2014-06-04 MED ORDER — ALLOPURINOL 100 MG PO TABS
100.0000 mg | ORAL_TABLET | Freq: Every day | ORAL | Status: DC
  Administered 2014-06-04 – 2014-06-05 (×2): 100 mg via ORAL
  Filled 2014-06-04 (×2): qty 1

## 2014-06-04 MED ORDER — FUROSEMIDE 40 MG PO TABS
40.0000 mg | ORAL_TABLET | Freq: Every day | ORAL | Status: DC
  Administered 2014-06-04 – 2014-06-05 (×2): 40 mg via ORAL
  Filled 2014-06-04 (×2): qty 1

## 2014-06-04 NOTE — Evaluation (Signed)
Physical Therapy Evaluation Patient Details Name: Adam Weeks MRN: [id number] DOB: [birth year] Today's Date: 06/04/2014   History of Present Illness  Patient is an 80 yo male admitted 06/03/14 with garbled speech and AMS.  Patient also with chronic back pain and reports gout pain in multiple joints.  Patient with h/o CAD, CABG, AVR, HTN.  Clinical Impression  Patient presents with problems listed below.  Will benefit from acute PT to maximize independence/safety prior to discharge home.  Recommend f/u PT for pain and mobility - patient [facility] here in [place].    Follow Up Recommendations Outpatient PT;Supervision/Assistance - 24 hour (OP PT arranged through [place] at site in [place]. Starts next week)    Equipment Recommendations  None recommended by PT    Recommendations for Other Services       Precautions / Restrictions Precautions Precautions: Fall Restrictions Weight Bearing Restrictions: No      Mobility  Bed Mobility                  Transfers Overall transfer level: Needs assistance Equipment used: Rolling walker (2 wheeled) Transfers: Sit to/from Stand Sit to Stand: Supervision         General transfer comment: Patient uses proper hand placement.  Increased time for transfer.  Assist for balance/safety.  Ambulation/Gait Ambulation/Gait assistance: Supervision Ambulation Distance (Feet): 24 Feet Assistive device: Rolling walker (2 wheeled) Gait Pattern/deviations: Step-through pattern;Decreased stride length;Shuffle;Trunk flexed Gait velocity: Decreased Gait velocity interpretation: Below normal speed for age/gender General Gait Details: Patient uses RW safely.  Cues to try to stand upright during gait.  Decreased distance due to increased back pain with gait.  Stairs            Wheelchair Mobility    Modified Rankin (Stroke Patients Only) Modified Rankin (Stroke Patients Only) Pre-Morbid Rankin  Score: Slight disability Modified Rankin: Moderate disability     Balance Overall balance assessment: Needs assistance Sitting-balance support: No upper extremity supported;Feet supported Sitting balance-Leahy Scale: Good     Standing balance support: No upper extremity supported Standing balance-Leahy Scale: Fair Standing balance comment: Able to maintain balance without UE support.  Improves with use of RW.                             Pertinent Vitals/Pain Pain - generalized.  Reports 3/10.    Home Living Family/patient expects to be discharged to:: Private residence Living Arrangements: Alone Available Help at Discharge: Family;Available 24 hours/day (Significant other) Type of Home: House (Condo) Home Access: Level entry     Home Layout: Two level;Bed/bath upstairs Home Equipment: Walker - 2 wheels;Shower seat;Bedside commode Additional Comments: Patient planning to stay with girlfriend at discharge - one story home with 5 steps to enter with rails.    Prior Function Level of Independence: Independent with assistive device(s)         Comments: Used RW occasionally due to back pain.  Drives     Hand Dominance        Extremity/Trunk Assessment   Upper Extremity Assessment: Generalized weakness           Lower Extremity Assessment: Generalized weakness      Cervical / Trunk Assessment: Kyphotic  Communication   Communication: No difficulties  Cognition Arousal/Alertness: Awake/alert Behavior During Therapy: WFL for tasks assessed/performed Overall Cognitive Status: Within Functional Limits for tasks assessed  General Comments      Exercises        Assessment/Plan    PT Assessment Patient needs continued PT services  PT Diagnosis Difficulty walking;Generalized weakness;Acute pain   PT Problem List Decreased strength;Decreased activity tolerance;Decreased balance;Decreased mobility;Pain  PT Treatment  Interventions DME instruction;Gait training;Stair training;Functional mobility training;Patient/family education   PT Goals (Current goals can be found in the Care Plan section) Acute Rehab PT Goals Patient Stated Goal: To decrease pain PT Goal Formulation: With patient Time For Goal Achievement: 06/11/14 Potential to Achieve Goals: Good    Frequency Min 3X/week   Barriers to discharge        Co-evaluation               End of Session Equipment Utilized During Treatment: Gait belt Activity Tolerance: Patient limited by pain Patient left: in bed;with call bell/phone within reach (sitting EOB) Nurse Communication: Mobility status         Time: [id number] PT Time Calculation (min): 32 min   Charges:   PT Evaluation $Initial PT Evaluation Tier I: 1 Procedure PT Treatments $Gait Training: 8-22 mins   PT G Codes:          Vena AustriaSusan H Jennavieve Arrick 06/04/2014, 10:02 AM Durenda HurtSusan H. Renaldo Fiddleravis, PT, [facility] Acute Rehab Services Pager [phone]

## 2014-06-04 NOTE — Progress Notes (Signed)
Occupational Therapy Evaluation Patient Details Name: Adam Weeks MRN: [id number] DOB: [birth year] Today's Date: 06/04/2014    History of Present Illness Patient is an 80 yo male admitted 06/03/14 with garbled speech and AMS.  Patient also with chronic back pain and reports gout pain in multiple joints.  Patient with h/o CAD, CABG, AVR, HTN.   Clinical Impression   PTA, pt lived alone and was independent with ADL and mobility. Pt with history of chronic pain. During evaluation, pt explained that he is in so much pain at times that it is "easier to sit around in a comfortable position then get up and fix something to eat/drink". Pt states that last week he took narcotic pain meds when not eating. Pt states that when he is  in pain, that he will "go days without eating or drinking anything".  Discussed home safety with pt/family and recommended home health OT/PT and nursing for medication management. Also recommended that family use care giving resource (i.e. [employer]) to help pt with cooking/cleaning etc. Also recommended using a "log" to record daily intake and medication reconciliation. Rec for pt to have initial 24/7 S until he returns to baseline. Will follow acutely to facilitate safe D/C plan.    Follow Up Recommendations  Home health OT;Supervision/Assistance - 24 hour    Equipment Recommendations  None recommended by OT    Recommendations for Other Services       Precautions / Restrictions Precautions Precautions: Fall Restrictions Weight Bearing Restrictions: No      Mobility Bed Mobility Overal bed mobility: Modified Independent                Transfers Overall transfer level: Needs assistance Equipment used: Rolling walker (2 wheeled) Transfers: Sit to/from [employer] Transfers Sit to Stand: Supervision         General transfer comment: Patient uses proper hand placement.  Increased time for transfer.  Assist for balance/safety.    Balance      Sitting balance-Leahy Scale: Good       Standing balance-Leahy Scale: Fair Standing balance comment: sway noted                            ADL Overall ADL's : Needs assistance/impaired     Grooming: Supervision/safety   Upper Body Bathing: Set up   Lower Body Bathing: Min guard   Upper Body Dressing : Set up   Lower Body Dressing: Min guard   Toilet Transfer: Minimal assistance (without RW)   Toileting- Clothing Manipulation and Hygiene: Min guard       Functional mobility during ADLs: Minimal assistance (without RW) General ADL Comments: Pt staes he does not use walker. Pt began mobilizing without walker and required mod A for balance check to prevent fall. Much better with use of RW     Vision                     Perception     Praxis Praxis Praxis tested?: Within functional limits    Pertinent Vitals/Pain VSS; c/o general pain     Hand Dominance     Extremity/Trunk Assessment Upper Extremity Assessment Upper Extremity Assessment: Generalized weakness   Lower Extremity Assessment Lower Extremity Assessment: Generalized weakness   Cervical / Trunk Assessment Cervical / Trunk Assessment: Kyphotic   Communication Communication Communication: No difficulties   Cognition Arousal/Alertness: Awake/alert Behavior During Therapy: WFL for tasks assessed/performed Overall Cognitive Status:  Within Functional Limits for tasks assessed       Memory:  (decreased safety/?judgement. baseline)             General Comments       Exercises       Shoulder Instructions      Home Living Family/patient expects to be discharged to:: Private residence Living Arrangements: Alone Available Help at Discharge: Family;Available PRN/intermittently (Significant other) Type of Home: House (Condo) Home Access: Stairs to enter Enter[employer] CorporationEntrance Stairs-Number of Steps: 6 Entrance Stairs-Rails:  (pt staes "tricky entry") Home Layout: Two level;Bed/bath  upstairs Alternate Level Stairs-Number of Steps: Flight Alternate Level Stairs-Rails: Right Bathroom Shower/Tub: Tub/shower unit Shower/tub characteristics: Engineer, building servicesCurtain Bathroom Toilet: Standard Bathroom Accessibility: Yes How Accessible: Accessible via walker Home Equipment: Walker - 2 wheels;Shower seat;Bedside commode   Additional Comments: Patient planning to stay with girlfriend at discharge - one story home with 5 steps to enter with rails.      Prior Functioning/Environment Level of Independence: Independent with assistive device(s)        Comments: Used RW occasionally due to back pain.  [place]    OT Diagnosis: Generalized weakness;Altered mental status   OT Problem List: Decreased strength;Decreased activity tolerance;Impaired balance (sitting and/or standing);Decreased safety awareness;Decreased knowledge of use of DME or AE;Cardiopulmonary status limiting activity   OT Treatment/Interventions: Self-care/ADL training;Therapeutic exercise;Energy conservation;DME and/or AE instruction;Therapeutic activities;Patient/family education;Balance training;Cognitive remediation/compensation    OT Goals(Current goals can be found in the care plan section) Acute Rehab OT Goals Patient Stated Goal: To decrease pain OT Goal Formulation: With patient/family Time For Goal Achievement: 06/18/14 Potential to Achieve Goals: Good  OT Frequency: Min 2X/week   Barriers to D/C:            Co-evaluation              End of Session Equipment Utilized During Treatment: Gait belt;Rolling walker Nurse Communication: Mobility status;Other (comment) (D/C concerns)  Activity Tolerance: Patient tolerated treatment well Patient left: in bed;with call bell/phone within reach;with family/visitor present   Time: 1410-1446 OT Time Calculation (min): 36 min Charges:  OT General Charges $OT Visit: 1 Procedure OT Evaluation $Initial OT Evaluation Tier I: 1 Procedure OT  Treatments $Self Care/Home Management : 23-37 mins G-Codes:    Lorinda CreedHilary S Maureen Duesing 06/04/2014, 4:23 PM   Freedom Lopezperez, OTR/L  [phone] 06/04/2014

## 2014-06-04 NOTE — Progress Notes (Signed)
Subjective: Patient much improved today sitting on the side of the bed.  Reports that his pain is significantly better.  Use of hands is improved.  No further confusion.    Objective: Current vital signs: BP 132/57  Pulse 88  Temp(Src) 98.1 F (36.7 C) (Oral)  Resp 16  Ht 5' 11"  (1.803 m)  Wt 71.26 kg (157 lb 1.6 oz)  BMI 21.92 kg/m2  SpO2 94% Vital signs in last 24 hours: Temp:  [97.2 F (36.2 C)-98.7 F (37.1 C)] 98.1 F (36.7 C) (06/07 0756) Pulse Rate:  [44-99] 88 (06/07 0756) Resp:  [16-18] 16 (06/07 0756) BP: (104-137)/(55-98) 132/57 mmHg (06/07 0756) SpO2:  [90 %-96 %] 94 % (06/07 0756) Weight:  [71.26 kg (157 lb 1.6 oz)-72.576 kg (160 lb)] 71.26 kg (157 lb 1.6 oz) (06/07 0400)  Intake/Output from previous day: 06/06 0701 - 06/07 0700 In: 360 [P.O.:360] Out: 500 [Urine:500] Intake/Output this shift: Total I/O In: 240 [P.O.:240] Out: -  Nutritional status: Cardiac  Neurologic Exam: Mental Status:  Alert, oriented, thought content appropriate. Speech fluent without evidence of aphasia. Able to follow 3 step commands without difficulty.  Cranial Nerves:  II: Discs flat bilaterally; Visual fields grossly normal, pupils equal, round, reactive to light and accommodation  III,IV, VI: ptosis not present, extra-ocular motions intact bilaterally  V,VII: smile symmetric, facial light touch sensation normal bilaterally  VIII: hearing normal bilaterally  IX,X: gag reflex present  XI: bilateral shoulder shrug  XII: midline tongue extension  Motor:  Patient able to lift LUE above his head easily. Has restricted ROM at the right shoulder. Has normal range at the elbow. Hand grip 5-/5 in the hands bilaterally. Lifts both legs easily. Tone and bulk:normal tone throughout; no atrophy noted  Sensory: Pinprick and light touch intact throughout, bilaterally.  Deep Tendon Reflexes: 2+ and symmetric throughout  Plantars:  Right: downgoing Left: downgoing  Cerebellar:  normal  finger-to-nose and normal heel-to-shin test   Lab Results: Basic Metabolic Panel:  Recent Labs Lab 06/03/14 0750 06/04/14 0415  NA 139  --   K 3.7  --   CL 98  --   CO2 25  --   GLUCOSE 124*  --   BUN 33*  --   CREATININE 1.47*  --   CALCIUM 8.6  --   MG 1.1* 1.7  PHOS 3.1  --     Liver Function Tests:  Recent Labs Lab 06/03/14 0750  AST 28  ALT 15  ALKPHOS 54  BILITOT 0.3  PROT 6.5  ALBUMIN 2.7*   No results found for this basename: LIPASE, AMYLASE,  in the last 168 hours No results found for this basename: AMMONIA,  in the last 168 hours  CBC:  Recent Labs Lab 06/03/14 0750  WBC 10.0  NEUTROABS 6.9  HGB 10.9*  HCT 33.3*  MCV 96.5  PLT 269    Cardiac Enzymes:  Recent Labs Lab 06/03/14 0750 06/03/14 1401  CKTOTAL  --  19  TROPONINI <0.30  --     Lipid Panel:  Recent Labs Lab 06/04/14 0415  CHOL 115  TRIG 110  HDL 31*  CHOLHDL 3.7  VLDL 22  LDLCALC 62    CBG:  Recent Labs Lab 06/03/14 2100 06/04/14 0755  GLUCAP 163* 164*    Microbiology: Results for orders placed during the hospital encounter of 06/03/14  URINE CULTURE     Status: None   Collection Time    06/03/14  9:23 AM  Result Value Ref Range Status   Specimen Description URINE, CLEAN CATCH   Final   Special Requests NONE   Final   Culture  Setup Time     Final   Value: 06/03/2014 10:31     Performed at [place]     Final   Value: NO GROWTH     Performed at [facility]   Culture     Final   Value: NO GROWTH     Performed at [facility]   Report Status 06/04/2014 FINAL   Final    Coagulation Studies: No results found for this basename: LABPROT, INR,  in the last 72 hours  Imaging: Dg Chest 2 View  06/03/2014   CLINICAL DATA:  Chest pain, weakness, body aches  EXAM: CHEST  2 VIEW  COMPARISON:  Prior chest x-ray 02/03/2014  FINDINGS: Very low inspiratory volumes with bibasilar subsegmental atelectasis. Cardiac  and mediastinal contours are unchanged. There is mild cardiomegaly and atherosclerosis of the tortuous thoracic aorta. 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 The mid and upper lungs are clear without evidence of a pneumothorax or pleural effusion. Background bronchitic changes and interstitial prominence are similar compared to prior. No acute osseous abnormality. Incompletely imaged thoracolumbar compression fracture with changes of prior vertebroplasty.  IMPRESSION: Very low inspiratory volumes with bibasilar subsegmental atelectasis.  Otherwise, stable chest x-ray without evidence of acute cardiopulmonary disease.   Electronically Signed   By: Jacqulynn Cadet M.D.   On: 06/03/2014 08:49   Ct Head Wo Contrast  06/03/2014   CLINICAL DATA:  Headache, dysphagia, weakness  EXAM: CT HEAD WITHOUT CONTRAST  TECHNIQUE: Contiguous axial images were obtained from the base of the skull through the vertex without intravenous contrast.  COMPARISON:  None.  FINDINGS: No evidence of parenchymal hemorrhage or extra-axial fluid collection. No mass lesion, mass effect, or midline shift.  No CT evidence of acute infarction.  Subcortical white matter and periventricular small vessel ischemic changes.  Age related atrophy.  No ventriculomegaly.  The visualized paranasal sinuses are essentially clear. The mastoid air cells are unopacified.  No evidence of calvarial fracture.  IMPRESSION: No evidence of acute intracranial abnormality.  Atrophy with small vessel ischemic changes.   Electronically Signed   By: Julian Hy M.D.   On: 06/03/2014 08:29    Medications:  I have reviewed the patient's current medications. Scheduled: . allopurinol  100 mg Oral Daily  . atorvastatin  20 mg Oral Daily  . carvedilol  12.5 mg Oral BID WC  . colchicine  0.6 mg Oral Daily  . docusate sodium  200 mg Oral BID  . finasteride  5 mg Oral Daily  . furosemide  40 mg Oral Daily  . heparin  5,000 Units  Subcutaneous 3 times per day  . lidocaine  1 patch Transdermal Q24H  . magnesium oxide  400 mg Oral BID  . pantoprazole  40 mg Oral Daily  . predniSONE  20 mg Oral BID WC  . venlafaxine XR  225 mg Oral Q breakfast    Assessment/Plan: Patient much improved since administration of steroids.  Lab work shows UA elevated at 9.9.  This is likely why CPR and ESR are elevated as well, 12.7 and 72 respectively.  Magnesium and CK were normal.  MRI pending.  Carotid dopplers shows no hemodynamically significant stenosis.    Recommendations: 1.  Will continue to follow up stroke w/u 2.  Continue ASA daily.     LOS: 1 day   Alexis Goodell, MD [facility] [phone] 06/04/2014  11:13 AM

## 2014-06-04 NOTE — Progress Notes (Signed)
[facility] PROGRESS NOTE  Adam KOLENOVIC JOI:[id number] DOB: 1927/08/30 DOA: 06/03/2014 PCP: Walker Kehr, MD  Assessment/Plan  Possible TIA: - Appreciate Neurology assistance -  MRI/MRA -  Carotid duplex -  Echo -  EEG -  PT/OT/SLP -  Aspirin   Generalized body ache with weakness concerning for PMR -  ESR 72 and CRP 12.2:  These could be elevated secondary to his active gallop versus PMR -  Recommend trial of steroids, RN he started on steroid burst for now but would need a very slow taper over several months  Acute gout involving right wrist:  -  Uric acid level 9.9 -  Restart colchicine -  restart allopurinol at 154m daily  Chronic kidney disease stage III, creatinine at baseline of 1.4-1.7. -  Minimize nephrotoxins -  Renally dose medications -  Restart Lasix  Hypomagnesemia:  -  Additional magnesium sulfate this morning  -  Continue oral magnesium   CAD and aortic valve replacement: Stable  -  Continue aspirin, statin, beta blocker  HTN, blood pressure normal to low -  contiue to hold norvasc  Chronic back pain: Avoid narcotics as much as possible considering that he has history of mental status changes due to opioids.  Normocytic anemia: Appears to be close to baseline. Monitor.  Diet:   healthy heart  Access:   PIV IVF:   off Proph:   heparin  Code Status:  full Family Communication:  patient alone  Disposition Plan:  likely home tomorrow after completion of his tests    Consultants:   neurology  Procedures:   MRI/MRA  Echo  Carotid duplex  Antibiotics:   none   HPI/Subjective:   states his right hand, wrist, and elbow feel much better. Denies any new speech difficulties, focal numbness or weakness.     Objective: Filed Vitals:   06/04/14 0000 06/04/14 0202 06/04/14 0400 06/04/14 0756  BP: 104/80 106/55 116/60 132/57  Pulse: 99 86 84 88  Temp: 97.2 F (36.2 C)  97.8 F (36.6 C) 98.1 F (36.7 C)  TempSrc: Oral  Oral Oral   Resp: 16 16 16 16   Height:      Weight:   71.26 kg (157 lb 1.6 oz)   SpO2: 91% 92% 92% 94%    Intake/Output Summary (Last 24 hours) at 06/04/14 0759 Last data filed at 06/04/14 0019  Gross per 24 hour  Intake    360 ml  Output    500 ml  Net   -140 ml   Filed Weights   06/03/14 1900 06/04/14 0400  Weight: 72.576 kg (160 lb) 71.26 kg (157 lb 1.6 oz)    Exam:   General:  Caucasian male,No acute distress  HEENT:  NCAT, MMM  Cardiovascular:  RRR, nl S1, S2 click, 2/6 systolic murmur at the right upper sternal border, 2+ pulses, warm extremities  Respiratory:  CTAB, no increased WOB  Abdomen:   NABS, soft, NT/ND  MSK:   Normal tone and bulk, no LEE, good range of motion of the right wrist, no erythema, no warmth   Neuro:   cranial nerves II through XII grossly intact, strength 5 out of 5 throughout, sensation intact to light touch   Data Reviewed: Basic Metabolic Panel:  Recent Labs Lab 06/03/14 0750 06/04/14 0415  NA 139  --   K 3.7  --   CL 98  --   CO2 25  --   GLUCOSE 124*  --   BUN 33*  --  CREATININE 1.47*  --   CALCIUM 8.6  --   MG 1.1* 1.7  PHOS 3.1  --    Liver Function Tests:  Recent Labs Lab 06/03/14 0750  AST 28  ALT 15  ALKPHOS 54  BILITOT 0.3  PROT 6.5  ALBUMIN 2.7*   No results found for this basename: LIPASE, AMYLASE,  in the last 168 hours No results found for this basename: AMMONIA,  in the last 168 hours CBC:  Recent Labs Lab 06/03/14 0750  WBC 10.0  NEUTROABS 6.9  HGB 10.9*  HCT 33.3*  MCV 96.5  PLT 269   Cardiac Enzymes:  Recent Labs Lab 06/03/14 0750 06/03/14 1401  CKTOTAL  --  7  TROPONINI <0.30  --    BNP (last 3 results) No results found for this basename: PROBNP,  in the last 8760 hours CBG:  Recent Labs Lab 06/03/14 2100  GLUCAP 163*    No results found for this or any previous visit (from the past 240 hour(s)).   Studies: Dg Chest 2 View  06/03/2014   CLINICAL DATA:  Chest pain, weakness,  body aches  EXAM: CHEST  2 VIEW  COMPARISON:  Prior chest x-ray 02/03/2014  FINDINGS: Very low inspiratory volumes with bibasilar subsegmental atelectasis. Cardiac and mediastinal contours are unchanged. There is mild cardiomegaly and atherosclerosis of the tortuous thoracic aorta. 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 The mid and upper lungs are clear without evidence of a pneumothorax or pleural effusion. Background bronchitic changes and interstitial prominence are similar compared to prior. No acute osseous abnormality. Incompletely imaged thoracolumbar compression fracture with changes of prior vertebroplasty.  IMPRESSION: Very low inspiratory volumes with bibasilar subsegmental atelectasis.  Otherwise, stable chest x-ray without evidence of acute cardiopulmonary disease.   Electronically Signed   By: Jacqulynn Cadet M.D.   On: 06/03/2014 08:49   Ct Head Wo Contrast  06/03/2014   CLINICAL DATA:  Headache, dysphagia, weakness  EXAM: CT HEAD WITHOUT CONTRAST  TECHNIQUE: Contiguous axial images were obtained from the base of the skull through the vertex without intravenous contrast.  COMPARISON:  None.  FINDINGS: No evidence of parenchymal hemorrhage or extra-axial fluid collection. No mass lesion, mass effect, or midline shift.  No CT evidence of acute infarction.  Subcortical white matter and periventricular small vessel ischemic changes.  Age related atrophy.  No ventriculomegaly.  The visualized paranasal sinuses are essentially clear. The mastoid air cells are unopacified.  No evidence of calvarial fracture.  IMPRESSION: No evidence of acute intracranial abnormality.  Atrophy with small vessel ischemic changes.   Electronically Signed   By: Julian Hy M.D.   On: 06/03/2014 08:29    Scheduled Meds: . aspirin  325 mg Oral Daily  . atorvastatin  20 mg Oral Daily  . carvedilol  12.5 mg Oral BID WC  . docusate sodium  200 mg Oral BID  . finasteride  5 mg Oral  Daily  . heparin  5,000 Units Subcutaneous 3 times per day  . lidocaine  1 patch Transdermal Q24H  . magnesium oxide  400 mg Oral BID  . magnesium sulfate 1 - 4 g bolus IVPB  2 g Intravenous Once  . pantoprazole  40 mg Oral Daily  . predniSONE  20 mg Oral BID WC  . venlafaxine XR  225 mg Oral Q breakfast   Continuous Infusions:   Principal Problem:   TIA (transient ischemic attack) Active Problems:   CAD (coronary artery disease)  S/P AVR (aortic valve replacement)   Hypomagnesemia   Metabolic alkalosis   Dehydration   Generalized weakness   Acute gout    Time spent: 30 min    [facility] Pager [phone]. If 7PM-7AM, please contact night-coverage at [email or web address], password [facility] 06/04/2014, 7:59 AM  LOS: 1 day

## 2014-06-04 NOTE — Progress Notes (Signed)
  Echocardiogram 2D Echocardiogram has been performed.  Adam Weeks 06/04/2014, 11:41 AM

## 2014-06-04 NOTE — Discharge Summary (Addendum)
Physician Discharge Summary  Adam Weeks OMB:[id number] DOB: [birth year] DOA: 06/03/2014  PCP: Walker Kehr, MD  Admit date: 06/03/2014 Discharge date: 06/05/2014  Recommendations for Outpatient Follow-up:  1. Followup with primary care doctor in 2 weeks for review of blood pressure and chronic medical problems.  Please consider referral to rheumatology for possible PMR and to discern this from possible RA vs. Gout.  Patient has frequent ectopy but no obvious a-fib.  Consider setting up for outpatient monitoring for a-fib for a few weeks.    Discharge Diagnoses:  Principal Problem:   TIA (transient ischemic attack) Active Problems:   CAD (coronary artery disease)   S/P AVR (aortic valve replacement)   Hypomagnesemia   Metabolic alkalosis   Dehydration   Generalized weakness   Acute gout   CKD (chronic kidney disease) stage 3, GFR 30-59 ml/min   Elevated sed rate   Possible PMR (polymyalgia rheumatica)   Discharge Condition:  Stable, improved  Diet recommendation: Healthy heart  Wt Readings from Last 3 Encounters:  06/04/14 71.26 kg (157 lb 1.6 oz)  02/03/14 72.576 kg (160 lb)  12/27/13 72.122 kg (159 lb)    History of present illness:  Adam Weeks is a 80 y.o. male with a past medical history of coronary artery disease, status post CABG, history of aortic valve replacement, most likely bioprosthetic valve not on anticoagulation, hypertension, gout, chronic back pain, who was in his usual state of health at about 4 AM this morning when his girlfriend noted that he was not acting as he usually does. He was somewhat lethargic. He had been to the bathroom and had to be assisted back to the bedroom. There is no history of falls. No recent illness. No fever. No chills. Patient appeared to be confused to the girlfriend. He started complaining of headache in the back of his head and in back of his neck. He then started reciting random numbers. This concerned the girlfriend and she called the  patient's son, who told her to call EMS. EMS was called and by the time they got to the house patient had garbled speech. CBG was checked and was normal. Within a few minutes of this his symptoms started improving and by the time EMS brought him to the emergency department he was back to his baseline. He denied any focal weakness. Patient cannot recall these events. According to the son, a similar episode happened back in October of 2014, which was thought to be related to medications, and narcotics. However, at that time he took about 24-36 hours to cover.  Patient also tells me that he was placed on prednisone about 6 months ago. Review of his records suggest that he was started on prednisone for back pain. After discussions with his nephrologist a few months ago he started tapering the dose. He tapered it down and has been off of prednisone for the last 2 weeks. Patient was also started on a new medication, most likely Allopurinol, recently. He has taken only one dose of the same. He also mentions, that over the last few weeks he has had generalized muscular pain. He's had generalized weakness. And in the last few days he has also noticed pain in his right wrist and has had difficulty moving it.   Hospital Course:   TIA:  The patient was seen by neurology. His MRI did not demonstrate acute stroke. He had a punctate area of remote hemorrhage in the right cerebellum that was nonspecific. His MRI was otherwise  normal. He had no significant stenosis, aneurysm, or branch vessel occlusions.  His carotid artery duplex demonstrated 1-39% ICA stenosis bilaterally and antegrade vertebral artery flow.  Echo demonstrated ejection fraction of 55-60% with moderate left atrial dilation, moderate tricuspid regurgitation and mild pulmonary artery hypertension with peak pressure of 42 mm of mercury. His bioprosthetic valve appeared to be working properly, no evidence of intracardiac thrombus.  Telemetry demonstrated sinus  rhythm with frequent ectopy and junctional escape rhythms.  Recommend outpatient heart monitoring to determine if he is having some atrial fibrillation.  He was started on full dose aspirin instead of baby aspirin.  Lipid panel as below. Hemoglobin A1c of 5.9. PT/OT:  Patient are not be set up for therapy as an outpatient via the [place].    Generalized body ache with weakness concerning for PMR.  ESR was 72 and CRP 12.2: These could be elevated secondary to his active gout versus RA or PMR.  He has had some jaw pain also.  Recommend start steroid burst for now but would need a very slow taper over several months.  Will give 20 mg twice a day for 7 days followed by 15 mg daily thereafter, with close followup with primary care and possible referral to rheumatology.  Acute gout involving right wrist:  Uric acid level 9.9.  Continued colchicine and added prednisone.  Increased allopurinol to 12m daily.  Chronic kidney disease stage III, creatinine at baseline of 1.4-1.7.  Continued lasix.  Hypomagnesemia: likely due to diuretics and received IV and oral supplementation.  CAD and aortic valve replacement: Stable.  Continued aspirin, statin, beta blocker  HTN, blood pressure normal to low.  Stopped norvasc for now, and he should have his blood pressure repeated by his primary care doctor in a few weeks.  HLD, HDL 31, LDL 62.  Start atorvastatin.    Chronic back pain: Avoid narcotics as much as possible considering that he has history of mental status changes due to opioids.   Normocytic anemia:  Stable, defer to PCP.    Consultants:  neurology Procedures:  MRI/MRA  Echo  Carotid duplex Antibiotics:  none    Discharge Exam: Filed Vitals:   06/05/14 0748  BP: 147/67  Pulse: 74  Temp: 98 F (36.7 C)  Resp: 16   Filed Vitals:   06/04/14 2000 06/05/14 0000 06/05/14 0602 06/05/14 0748  BP: 123/62 114/60 122/73 147/67  Pulse: 76 83 89 74  Temp: 97.6 F (36.4 C) 97.9 F (36.6 C) 98.4 F  (36.9 C) 98 F (36.7 C)  TempSrc:   Oral Oral  Resp: 16 16 18 16   Height:      Weight:      SpO2: 90% 95% 95% 93%    General: Caucasian male,No acute distress HEENT: NCAT, MMM  Cardiovascular: RRR, nl S1, S2 click, 2/6 systolic murmur at the right upper sternal border, 2+ pulses, warm extremities  Respiratory: CTAB, no increased WOB  Abdomen: NABS, soft, NT/ND  MSK: Normal tone and bulk, no LEE, good range of motion of the right wrist, no erythema, no warmth and no more swelling Neuro: cranial nerves II through XII grossly intact, strength 5 out of 5 throughout, sensation intact to light touch    Discharge Instructions      Discharge Instructions   Call MD for:  difficulty breathing, headache or visual disturbances    Complete by:  As directed      Call MD for:  extreme fatigue    Complete by:  As directed      Call MD for:  hives    Complete by:  As directed      Call MD for:  persistant dizziness or light-headedness    Complete by:  As directed      Call MD for:  persistant nausea and vomiting    Complete by:  As directed      Call MD for:  severe uncontrolled pain    Complete by:  As directed      Call MD for:  temperature >100.4    Complete by:  As directed      Diet - low sodium heart healthy    Complete by:  As directed      Discharge instructions    Complete by:  As directed   You were hospitalized with a transient ischemic attack, or almost stroke.  We tried to identify risk factors for stroke.  Please increase your aspirin to 33m daily and I have increased your atorvastatin.  Because your blood pressure was low please stop your norvasc for now and have your primary care doctor check your blood pressure at your follow up appointment.  Your primary doctor may want to set up outpatient heart monitoring for a while to make sure you are not having any abnormal heart rhythms which can increase your risk of stroke.  Finally, for your gout and possible polymyalgia  rheumatica, please take prednisone 10 mg tabs, two tabs in the morning and two tabs at night for the next 5 days and then take one and a half tabs once a day thereafter until you follow up with your primary care doctor.  Please increase your allopurinol to 1056mdaily.  If you have sudden numbness, weakness, slurred speech, or confusion, call 911.     Increase activity slowly    Complete by:  As directed             Medication List    STOP taking these medications       amLODipine 10 MG tablet  Commonly known as:  NORVASC     aspirin 81 MG tablet  Replaced by:  aspirin EC 325 MG tablet      TAKE these medications       acetaminophen 500 MG tablet  Commonly known as:  TYLENOL  Take 1,000 mg by mouth every 6 (six) hours as needed for mild pain.     allopurinol 100 MG tablet  Commonly known as:  ZYLOPRIM  Take 1 tablet (100 mg total) by mouth daily.     aspirin EC 325 MG tablet  Take 1 tablet (325 mg total) by mouth daily.     atorvastatin 40 MG tablet  Commonly known as:  LIPITOR  Take 1 tablet (40 mg total) by mouth daily.     calcium carbonate 1250 MG tablet  Commonly known as:  OS-CAL - dosed in mg of elemental calcium  Take 1.5 tablets by mouth daily.     carvedilol 12.5 MG tablet  Commonly known as:  COREG  Take 12.5 mg by mouth 2 (two) times daily with a meal.     Cholecalciferol 1000 UNITS tablet  Take 1,000 Units by mouth daily.     colchicine 0.6 MG tablet  Commonly known as:  COLCRYS  Take 1 tablet (0.6 mg total) by mouth daily.     docusate sodium 100 MG capsule  Commonly known as:  COLACE  Take 200 mg by mouth 2 (two) times daily.  finasteride 5 MG tablet  Commonly known as:  PROSCAR  Take 5 mg by mouth daily.     furosemide 40 MG tablet  Commonly known as:  LASIX  Take 40 mg by mouth daily.     lidocaine 5 %  Commonly known as:  LIDODERM  Place 1 patch onto the skin daily. Remove & Discard patch within 12 hours or as directed by MD      OCUVITE PRESERVISION Tabs  Take 2 tablets by mouth 2 (two) times daily.     omeprazole 20 MG capsule  Commonly known as:  PRILOSEC  Take 20 mg by mouth daily.     predniSONE 10 MG tablet  Commonly known as:  DELTASONE  2 tabs twice daily for 5 days, followed by 1.5 tabs (103m) daily thereafter.     Venlafaxine HCl 225 MG Tb24  Take 225 mg by mouth daily.       Follow-up Information   Follow up with AWalker Kehr MD. Schedule an appointment as soon as possible for a visit in 2 weeks.   Specialty:  Internal Medicine   Contact information:   [id number] [phone]       The results of significant diagnostics from this hospitalization (including imaging, microbiology, ancillary and laboratory) are listed below for reference.    Significant Diagnostic Studies: Dg Chest 2 View  06/03/2014   CLINICAL DATA:  Chest pain, weakness, body aches  EXAM: CHEST  2 VIEW  COMPARISON:  Prior chest x-ray 02/03/2014  FINDINGS: Very low inspiratory volumes with bibasilar subsegmental atelectasis. Cardiac and mediastinal contours are unchanged. There is mild cardiomegaly and atherosclerosis of the tortuous thoracic aorta. 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 The mid and upper lungs are clear without evidence of a pneumothorax or pleural effusion. Background bronchitic changes and interstitial prominence are similar compared to prior. No acute osseous abnormality. Incompletely imaged thoracolumbar compression fracture with changes of prior vertebroplasty.  IMPRESSION: Very low inspiratory volumes with bibasilar subsegmental atelectasis.  Otherwise, stable chest x-ray without evidence of acute cardiopulmonary disease.   Electronically Signed   By: HJacqulynn CadetM.D.   On: 06/03/2014 08:49   Ct Head Wo Contrast  06/03/2014   CLINICAL DATA:  Headache, dysphagia, weakness  EXAM: CT HEAD WITHOUT CONTRAST  TECHNIQUE: Contiguous axial images were  obtained from the base of the skull through the vertex without intravenous contrast.  COMPARISON:  None.  FINDINGS: No evidence of parenchymal hemorrhage or extra-axial fluid collection. No mass lesion, mass effect, or midline shift.  No CT evidence of acute infarction.  Subcortical white matter and periventricular small vessel ischemic changes.  Age related atrophy.  No ventriculomegaly.  The visualized paranasal sinuses are essentially clear. The mastoid air cells are unopacified.  No evidence of calvarial fracture.  IMPRESSION: No evidence of acute intracranial abnormality.  Atrophy with small vessel ischemic changes.   Electronically Signed   By: SJulian HyM.D.   On: 06/03/2014 08:29   Mri Brain Without Contrast  06/04/2014   CLINICAL DATA:  Altered mental status. Weakness and upper extremity pain. Slurred speech. Symptoms resolved at the time of evaluation.  EXAM: MRI HEAD WITHOUT CONTRAST  MRA HEAD WITHOUT CONTRAST  TECHNIQUE: Multiplanar, multiecho pulse sequences of the brain and surrounding structures were obtained without intravenous contrast. Angiographic images of the head were obtained using MRA technique without contrast.  COMPARISON:  CT head without contrast 06/03/2014.  FINDINGS:  MRI HEAD FINDINGS  The diffusion-weighted images demonstrate no evidence for acute or subacute infarction. A punctate area of susceptibility in the right cerebellum is compatible with a remote hemorrhage. No acute hemorrhage or mass lesion is present. Mild atrophy and minimal white matter disease is within normal limits for age.  Flow is present in the major intracranial arteries. The patient is status post bilateral lens extractions. The globes and orbits are otherwise intact. The paranasal sinuses and mastoid air cells are clear.  MRA HEAD FINDINGS  The internal carotid arteries are within normal limits from the high cervical segments through the ICA termini bilaterally. The A1 and M1 segments are normal. The  study is mildly degraded by patient motion. The MCA bifurcations are intact. Mild segmental irregularity is noted in the distal MCA branch vessels.  The left vertebral artery is slightly dominant to the right. The PICA origins are visualized and normal. The basilar artery is within normal limits. The right posterior cerebral artery originates from the basilar tip. The left posterior cerebral artery is of fetal type.  IMPRESSION: 1. No acute or focal abnormality to explain the patient's symptoms. 2. Punctate area of remote hemorrhage in the right cerebellum is nonspecific. No discrete lesion is evident on other sequences. 3. Otherwise normal MRI of the brain for age. 4. Minimal distal small vessel disease is evident on the MRA. No significant proximal stenosis, aneurysm, or branch vessel occlusion is present.   Electronically Signed   By: Lawrence Santiago M.D.   On: 06/04/2014 13:36   Mr Jodene Nam Head/brain Wo Cm  06/04/2014   CLINICAL DATA:  Altered mental status. Weakness and upper extremity pain. Slurred speech. Symptoms resolved at the time of evaluation.  EXAM: MRI HEAD WITHOUT CONTRAST  MRA HEAD WITHOUT CONTRAST  TECHNIQUE: Multiplanar, multiecho pulse sequences of the brain and surrounding structures were obtained without intravenous contrast. Angiographic images of the head were obtained using MRA technique without contrast.  COMPARISON:  CT head without contrast 06/03/2014.  FINDINGS: MRI HEAD FINDINGS  The diffusion-weighted images demonstrate no evidence for acute or subacute infarction. A punctate area of susceptibility in the right cerebellum is compatible with a remote hemorrhage. No acute hemorrhage or mass lesion is present. Mild atrophy and minimal white matter disease is within normal limits for age.  Flow is present in the major intracranial arteries. The patient is status post bilateral lens extractions. The globes and orbits are otherwise intact. The paranasal sinuses and mastoid air cells are clear.   MRA HEAD FINDINGS  The internal carotid arteries are within normal limits from the high cervical segments through the ICA termini bilaterally. The A1 and M1 segments are normal. The study is mildly degraded by patient motion. The MCA bifurcations are intact. Mild segmental irregularity is noted in the distal MCA branch vessels.  The left vertebral artery is slightly dominant to the right. The PICA origins are visualized and normal. The basilar artery is within normal limits. The right posterior cerebral artery originates from the basilar tip. The left posterior cerebral artery is of fetal type.  IMPRESSION: 1. No acute or focal abnormality to explain the patient's symptoms. 2. Punctate area of remote hemorrhage in the right cerebellum is nonspecific. No discrete lesion is evident on other sequences. 3. Otherwise normal MRI of the brain for age. 4. Minimal distal small vessel disease is evident on the MRA. No significant proximal stenosis, aneurysm, or branch vessel occlusion is present.   Electronically Signed   By: Gerald Stabs  Mattern M.D.   On: 06/04/2014 13:36    Microbiology: Recent Results (from the past 240 hour(s))  URINE CULTURE     Status: None   Collection Time    06/03/14  9:23 AM      Result Value Ref Range Status   Specimen Description URINE, CLEAN CATCH   Final   Special Requests NONE   Final   Culture  Setup Time     Final   Value: 06/03/2014 10:31     Performed at [facility]     Final   Value: NO GROWTH     Performed at [facility]   Culture     Final   Value: NO GROWTH     Performed at [facility]   Report Status 06/04/2014 FINAL   Final     Labs: Basic Metabolic Panel:  Recent Labs Lab 06/03/14 0750 06/04/14 0415  NA 139  --   K 3.7  --   CL 98  --   CO2 25  --   GLUCOSE 124*  --   BUN 33*  --   CREATININE 1.47*  --   CALCIUM 8.6  --   MG 1.1* 1.7  PHOS 3.1  --    Liver Function Tests:  Recent Labs Lab 06/03/14 0750   AST 28  ALT 15  ALKPHOS 54  BILITOT 0.3  PROT 6.5  ALBUMIN 2.7*   No results found for this basename: LIPASE, AMYLASE,  in the last 168 hours No results found for this basename: AMMONIA,  in the last 168 hours CBC:  Recent Labs Lab 06/03/14 0750  WBC 10.0  NEUTROABS 6.9  HGB 10.9*  HCT 33.3*  MCV 96.5  PLT 269   Cardiac Enzymes:  Recent Labs Lab 06/03/14 0750 06/03/14 1401  CKTOTAL  --  59  TROPONINI <0.30  --    BNP: BNP (last 3 results) No results found for this basename: PROBNP,  in the last 8760 hours CBG:  Recent Labs Lab 06/03/14 2100 06/04/14 0755 06/04/14 1630 06/04/14 2054 06/05/14 0725  GLUCAP 163* 164* 150* 142* 145*    Time coordinating discharge: 45 minutes  Signed:  Janece Canterbury  [facility] 06/05/2014, 9:01 AM

## 2014-06-04 NOTE — Progress Notes (Signed)
VASCULAR LAB PRELIMINARY  PRELIMINARY  PRELIMINARY  PRELIMINARY  Carotid Dopplers completed.    Preliminary report:  1-39% ICA stenosis.  Vertebral artery flow is antegrade.  Kern Alberta, RVT 06/04/2014, 10:01 AM

## 2014-06-05 ENCOUNTER — Ambulatory Visit (HOSPITAL_COMMUNITY): Payer: Medicare Other

## 2014-06-05 ENCOUNTER — Ambulatory Visit: Payer: Medicare Other | Admitting: Family Medicine

## 2014-06-05 DIAGNOSIS — I10 Essential (primary) hypertension: Secondary | ICD-10-CM

## 2014-06-05 DIAGNOSIS — N183 Chronic kidney disease, stage 3 unspecified: Secondary | ICD-10-CM

## 2014-06-05 DIAGNOSIS — M353 Polymyalgia rheumatica: Secondary | ICD-10-CM

## 2014-06-05 LAB — GLUCOSE, CAPILLARY: Glucose-Capillary: 145 mg/dL — ABNORMAL HIGH (ref 70–99)

## 2014-06-05 MED ORDER — ATORVASTATIN CALCIUM 40 MG PO TABS: 40.0000 mg | ORAL_TABLET | Freq: Every day | ORAL | Status: AC

## 2014-06-05 MED ORDER — ALLOPURINOL 100 MG PO TABS: 100.0000 mg | ORAL_TABLET | Freq: Every day | ORAL | Status: AC

## 2014-06-05 MED ORDER — COLCHICINE 0.6 MG PO TABS: 0.6000 mg | ORAL_TABLET | Freq: Every day | ORAL | Status: DC

## 2014-06-05 MED ORDER — PREDNISONE 10 MG PO TABS: ORAL_TABLET | ORAL | Status: DC

## 2014-06-05 MED ORDER — ASPIRIN EC 325 MG PO TBEC: 325.0000 mg | DELAYED_RELEASE_TABLET | Freq: Every day | ORAL | Status: DC

## 2014-06-05 NOTE — Progress Notes (Signed)
Pt d/c'd via wheelchair with belongings to d/c lounge while waiting for ride home to arrive to hospital.

## 2014-06-05 NOTE — Care Management Note (Signed)
    Page 1 of 1   06/05/2014     11:31:12 AM CARE MANAGEMENT NOTE 06/05/2014  Patient:  Adam Weeks, Adam Weeks   Account Number:  [id number]  Date Initiated:  06/05/2014  Documentation initiated by:  GRAVES-BIGELOW,Tsuneo Faison  Subjective/Objective Assessment:   Pt admitted for TIA. Plans for d/c today. Pt states he has outpatient PT services via the [place]. Pt states he will just need to call to resume services.     Action/Plan:   No needs from CM at this time.   Anticipated DC Date:  06/05/2014   Anticipated DC Plan:  HOME/SELF CARE      DC Planning Services  CM consult      Choice offered to / List presented to:             Status of service:  Completed, signed off Medicare Important Message given?  YES (If response is "NO", the following Medicare IM given date fields will be blank) Date Medicare IM given:  06/05/2014 Date Additional Medicare IM given:    Discharge Disposition:  HOME/SELF CARE  Per UR Regulation:  Reviewed for med. necessity/level of care/duration of stay  If discussed at Long Length of Stay Meetings, dates discussed:    Comments:

## 2014-06-05 NOTE — Progress Notes (Signed)
Subjective: Patient has has no further episodes of confusion.  Reports feeling better.    Objective: Current vital signs: BP 147/67  Pulse 74  Temp(Src) 98 F (36.7 C) (Oral)  Resp 16  Ht 5\' 11"  (1.803 m)  Wt 71.26 kg (157 lb 1.6 oz)  BMI 21.92 kg/m2  SpO2 93% Vital signs in last 24 hours: Temp:  [93.1 F (33.9 C)-98.4 F (36.9 C)] 98 F (36.7 C) (06/08 0748) Pulse Rate:  [74-89] 74 (06/08 0748) Resp:  [16-18] 16 (06/08 0748) BP: (114-147)/(60-73) 147/67 mmHg (06/08 0748) SpO2:  [90 %-95 %] 93 % (06/08 0748)  Intake/Output from previous day: 06/07 0701 - 06/08 0700 In: 840 [P.O.:840] Out: 300 [Urine:300] Intake/Output this shift:   Nutritional status: Cardiac  Neurologic Exam: Mental Status:  Alert, oriented, thought content appropriate. Speech fluent without evidence of aphasia. Able to follow 3 step commands without difficulty.  Cranial Nerves:  II: Discs flat bilaterally; Visual fields grossly normal, pupils equal, round, reactive to light and accommodation  III,IV, VI: ptosis not present, extra-ocular motions intact bilaterally  V,VII: smile symmetric, facial light touch sensation normal bilaterally  VIII: hearing normal bilaterally  IX,X: gag reflex present  XI: bilateral shoulder shrug  XII: midline tongue extension  Motor:  Patient able to lift LUE above his head easily. Hand grip improved bilaterally. Lifts both legs easily.  Tone and bulk:normal tone throughout; no atrophy noted  Sensory: Pinprick and light touch intact throughout, bilaterally.  Deep Tendon Reflexes: 2+ and symmetric throughout  Plantars:  Right: downgoing Left: downgoing  Cerebellar:  normal finger-to-nose and normal heel-to-shin test   Lab Results: Basic Metabolic Panel:  Recent Labs Lab 06/03/14 0750 06/04/14 0415  NA 139  --   K 3.7  --   CL 98  --   CO2 25  --   GLUCOSE 124*  --   BUN 33*  --   CREATININE 1.47*  --   CALCIUM 8.6  --   MG 1.1* 1.7  PHOS 3.1  --      Liver Function Tests:  Recent Labs Lab 06/03/14 0750  AST 28  ALT 15  ALKPHOS 54  BILITOT 0.3  PROT 6.5  ALBUMIN 2.7*   No results found for this basename: LIPASE, AMYLASE,  in the last 168 hours No results found for this basename: AMMONIA,  in the last 168 hours  CBC:  Recent Labs Lab 06/03/14 0750  WBC 10.0  NEUTROABS 6.9  HGB 10.9*  HCT 33.3*  MCV 96.5  PLT 269    Cardiac Enzymes:  Recent Labs Lab 06/03/14 0750 06/03/14 1401  CKTOTAL  --  59  TROPONINI <0.30  --     Lipid Panel:  Recent Labs Lab 06/04/14 0415  CHOL 115  TRIG 110  HDL 31*  CHOLHDL 3.7  VLDL 22  LDLCALC 62    CBG:  Recent Labs Lab 06/03/14 2100 06/04/14 0755 06/04/14 1630 06/04/14 2054 06/05/14 0725  GLUCAP 163* 164* 150* 142* 145*    Microbiology: Results for orders placed during the hospital encounter of 06/03/14  URINE CULTURE     Status: None   Collection Time    06/03/14  9:23 AM      Result Value Ref Range Status   Specimen Description URINE, CLEAN CATCH   Final   Special Requests NONE   Final   Culture  Setup Time     Final   Value: 06/03/2014 10:31     Performed at [facility]   Colony Count     Final   Value: NO GROWTH     Performed at [facility]   Culture     Final   Value: NO GROWTH     Performed at [facility]   Report Status 06/04/2014 FINAL   Final    Coagulation Studies: No results found for this basename: LABPROT, INR,  in the last 72 hours  Imaging: Mri Brain Without Contrast  06/04/2014   CLINICAL DATA:  Altered mental status. Weakness and upper extremity pain. Slurred speech. Symptoms resolved at the time of evaluation.  EXAM: MRI HEAD WITHOUT CONTRAST  MRA HEAD WITHOUT CONTRAST  TECHNIQUE: Multiplanar, multiecho pulse sequences of the brain and surrounding structures were obtained without intravenous contrast. Angiographic images of the head were obtained using MRA technique without contrast.  COMPARISON:  CT  head without contrast 06/03/2014.  FINDINGS: MRI HEAD FINDINGS  The diffusion-weighted images demonstrate no evidence for acute or subacute infarction. A punctate area of susceptibility in the right cerebellum is compatible with a remote hemorrhage. No acute hemorrhage or mass lesion is present. Mild atrophy and minimal white matter disease is within normal limits for age.  Flow is present in the major intracranial arteries. The patient is status post bilateral lens extractions. The globes and orbits are otherwise intact. The paranasal sinuses and mastoid air cells are clear.  MRA HEAD FINDINGS  The internal carotid arteries are within normal limits from the high cervical segments through the ICA termini bilaterally. The A1 and M1 segments are normal. The study is mildly degraded by patient motion. The MCA bifurcations are intact. Mild segmental irregularity is noted in the distal MCA branch vessels.  The left vertebral artery is slightly dominant to the right. The PICA origins are visualized and normal. The basilar artery is within normal limits. The right posterior cerebral artery originates from the basilar tip. The left posterior cerebral artery is of fetal type.  IMPRESSION: 1. No acute or focal abnormality to explain the patient's symptoms. 2. Punctate area of remote hemorrhage in the right cerebellum is nonspecific. No discrete lesion is evident on other sequences. 3. Otherwise normal MRI of the brain for age. 4. Minimal distal small vessel disease is evident on the MRA. No significant proximal stenosis, aneurysm, or branch vessel occlusion is present.   Electronically Signed   By: Gennette Pachris  Mattern M.D.   On: 06/04/2014 13:36   Mr Maxine GlennMra Head/brain Wo Cm  06/04/2014   CLINICAL DATA:  Altered mental status. Weakness and upper extremity pain. Slurred speech. Symptoms resolved at the time of evaluation.  EXAM: MRI HEAD WITHOUT CONTRAST  MRA HEAD WITHOUT CONTRAST  TECHNIQUE: Multiplanar, multiecho pulse sequences of  the brain and surrounding structures were obtained without intravenous contrast. Angiographic images of the head were obtained using MRA technique without contrast.  COMPARISON:  CT head without contrast 06/03/2014.  FINDINGS: MRI HEAD FINDINGS  The diffusion-weighted images demonstrate no evidence for acute or subacute infarction. A punctate area of susceptibility in the right cerebellum is compatible with a remote hemorrhage. No acute hemorrhage or mass lesion is present. Mild atrophy and minimal white matter disease is within normal limits for age.  Flow is present in the major intracranial arteries. The patient is status post bilateral lens extractions. The globes and orbits are otherwise intact. The paranasal sinuses and mastoid air cells are clear.  MRA HEAD FINDINGS  The internal carotid arteries are within normal limits from the high cervical  segments through the ICA termini bilaterally. The A1 and M1 segments are normal. The study is mildly degraded by patient motion. The MCA bifurcations are intact. Mild segmental irregularity is noted in the distal MCA branch vessels.  The left vertebral artery is slightly dominant to the right. The PICA origins are visualized and normal. The basilar artery is within normal limits. The right posterior cerebral artery originates from the basilar tip. The left posterior cerebral artery is of fetal type.  IMPRESSION: 1. No acute or focal abnormality to explain the patient's symptoms. 2. Punctate area of remote hemorrhage in the right cerebellum is nonspecific. No discrete lesion is evident on other sequences. 3. Otherwise normal MRI of the brain for age. 4. Minimal distal small vessel disease is evident on the MRA. No significant proximal stenosis, aneurysm, or branch vessel occlusion is present.   Electronically Signed   By: Gennette Pac M.D.   On: 06/04/2014 13:36    Medications:  I have reviewed the patient's current medications. Scheduled: . allopurinol  100 mg  Oral Daily  . atorvastatin  20 mg Oral Daily  . carvedilol  12.5 mg Oral BID WC  . colchicine  0.6 mg Oral Daily  . docusate sodium  200 mg Oral BID  . finasteride  5 mg Oral Daily  . furosemide  40 mg Oral Daily  . heparin  5,000 Units Subcutaneous 3 times per day  . lidocaine  1 patch Transdermal Q24H  . magnesium oxide  400 mg Oral BID  . pantoprazole  40 mg Oral Daily  . predniSONE  20 mg Oral BID WC  . venlafaxine XR  225 mg Oral Q breakfast    Assessment/Plan: Patient much improved.  MRI reviewed and shows no acute changes.    Recommendations: 1.  No further neurologic intervention is recommended at this time.  If further questions arise, please call or page at that time.  Thank you for allowing neurology to participate in the care of this patient.    LOS: 2 days   Thana Farr, MD [facility] [phone] 06/05/2014  9:41 AM

## 2014-06-05 NOTE — Progress Notes (Signed)
UR Completed Kam Rahimi Graves-Bigelow, RN,BSN [phone]  

## 2014-06-05 NOTE — Progress Notes (Addendum)
D/c instructions reviewed with pt. Copy of instructions and scripts given to pt. Stroke education reviewed and more handouts given (see d/c instructions, pt already had stroke booklet). Pt discharging to the d/c lounge until his ride arrives, pt's ride coming to [place] [facility] entrance and will call pt upon arrival.

## 2014-06-16 ENCOUNTER — Ambulatory Visit (INDEPENDENT_AMBULATORY_CARE_PROVIDER_SITE_OTHER): Payer: Medicare Other | Admitting: Internal Medicine

## 2014-06-16 ENCOUNTER — Encounter: Payer: Self-pay | Admitting: Internal Medicine

## 2014-06-16 ENCOUNTER — Other Ambulatory Visit (INDEPENDENT_AMBULATORY_CARE_PROVIDER_SITE_OTHER): Payer: Medicare Other

## 2014-06-16 VITALS — BP 118/60 | HR 68 | Temp 98.2°F | Resp 16 | Wt 158.0 lb

## 2014-06-16 DIAGNOSIS — M353 Polymyalgia rheumatica: Secondary | ICD-10-CM | POA: Insufficient documentation

## 2014-06-16 DIAGNOSIS — R5383 Other fatigue: Secondary | ICD-10-CM

## 2014-06-16 DIAGNOSIS — I129 Hypertensive chronic kidney disease with stage 1 through stage 4 chronic kidney disease, or unspecified chronic kidney disease: Secondary | ICD-10-CM

## 2014-06-16 DIAGNOSIS — R5381 Other malaise: Secondary | ICD-10-CM

## 2014-06-16 DIAGNOSIS — G459 Transient cerebral ischemic attack, unspecified: Secondary | ICD-10-CM

## 2014-06-16 DIAGNOSIS — R609 Edema, unspecified: Secondary | ICD-10-CM

## 2014-06-16 DIAGNOSIS — M1009 Idiopathic gout, multiple sites: Secondary | ICD-10-CM

## 2014-06-16 DIAGNOSIS — R358 Other polyuria: Secondary | ICD-10-CM

## 2014-06-16 DIAGNOSIS — N289 Disorder of kidney and ureter, unspecified: Secondary | ICD-10-CM

## 2014-06-16 DIAGNOSIS — M109 Gout, unspecified: Secondary | ICD-10-CM

## 2014-06-16 DIAGNOSIS — M545 Low back pain, unspecified: Secondary | ICD-10-CM

## 2014-06-16 DIAGNOSIS — R531 Weakness: Secondary | ICD-10-CM

## 2014-06-16 DIAGNOSIS — R3589 Other polyuria: Secondary | ICD-10-CM

## 2014-06-16 DIAGNOSIS — R600 Localized edema: Secondary | ICD-10-CM

## 2014-06-16 LAB — HEPATIC FUNCTION PANEL
ALBUMIN: 3.6 g/dL (ref 3.5–5.2)
ALT: 20 U/L (ref 0–53)
AST: 20 U/L (ref 0–37)
Alkaline Phosphatase: 39 U/L (ref 39–117)
Bilirubin, Direct: 0.1 mg/dL (ref 0.0–0.3)
Total Bilirubin: 0.5 mg/dL (ref 0.2–1.2)
Total Protein: 6.6 g/dL (ref 6.0–8.3)

## 2014-06-16 LAB — GLUCOSE, POCT (MANUAL RESULT ENTRY): POC Glucose: 124 mg/dl — AB (ref 70–99)

## 2014-06-16 LAB — BASIC METABOLIC PANEL
BUN: 38 mg/dL — AB (ref 6–23)
CALCIUM: 9.3 mg/dL (ref 8.4–10.5)
CO2: 29 mEq/L (ref 19–32)
Chloride: 100 mEq/L (ref 96–112)
Creatinine, Ser: 1.2 mg/dL (ref 0.4–1.5)
GFR: 61.18 mL/min (ref >60.00)
GLUCOSE: 113 mg/dL — AB (ref 70–99)
POTASSIUM: 5.5 meq/L — AB (ref 3.5–5.1)
Sodium: 137 mEq/L (ref 135–145)

## 2014-06-16 LAB — MAGNESIUM: Magnesium: 1.8 mg/dL (ref 1.5–2.5)

## 2014-06-16 LAB — URIC ACID: Uric Acid, Serum: 4.5 mg/dL (ref 4.0–7.8)

## 2014-06-16 LAB — SEDIMENTATION RATE: Sed Rate: 30 mm/hr — ABNORMAL HIGH (ref 0–22)

## 2014-06-16 MED ORDER — PREDNISONE 10 MG PO TABS: 10.0000 mg | ORAL_TABLET | Freq: Every day | ORAL | Status: DC

## 2014-06-16 MED ORDER — PREDNISONE 10 MG PO TABS
10.0000 mg | ORAL_TABLET | Freq: Every day | ORAL | Status: DC
Start: 2014-06-16 — End: 2014-07-31

## 2014-06-16 MED ORDER — COLCHICINE 0.6 MG PO TABS: 0.6000 mg | ORAL_TABLET | Freq: Two times a day (BID) | ORAL | Status: DC | PRN

## 2014-06-16 NOTE — Assessment & Plan Note (Signed)
Continue with current prescription therapy as reflected on the Med list.  

## 2014-06-16 NOTE — Assessment & Plan Note (Signed)
Reduce Prednisone to 10 mg/d Labs

## 2014-06-16 NOTE — Assessment & Plan Note (Signed)
6/15 - confusion, aphasia Continue with current prescription therapy as reflected on the Med list. Hosp notes reviewed

## 2014-06-16 NOTE — Progress Notes (Signed)
Subjective:   Admit date: 06/03/2014  Discharge date: 06/05/2014  Recommendations for Outpatient Follow-up:  1. Followup with primary care doctor in 2 weeks for review of blood pressure and chronic medical problems. Please consider referral to rheumatology for possible PMR and to discern this from possible RA vs. Gout. Patient has frequent ectopy but no obvious a-fib. Consider setting up for outpatient monitoring for a-fib for a few weeks.  Discharge Diagnoses:  Principal Problem:  TIA (transient ischemic attack)  Active Problems:  CAD (coronary artery disease)  S/P AVR (aortic valve replacement)  Hypomagnesemia  Metabolic alkalosis  Dehydration  Generalized weakness  Acute gout  CKD (chronic kidney disease) stage 3, GFR 30-59 ml/min  Elevated sed rate  Possible PMR (polymyalgia rheumatica)  Discharge Condition: Stable, improved  Diet recommendation: Healthy heart  Wt Readings from Last 3 Encounters:   06/04/14  71.26 kg (157 lb 1.6 oz)   02/03/14  72.576 kg (160 lb)   12/27/13  72.122 kg (159 lb)   History of present illness:  Adam Weeks is a 80 y.o. male with a past medical history of coronary artery disease, status post CABG, history of aortic valve replacement, most likely bioprosthetic valve not on anticoagulation, hypertension, gout, chronic back pain, who was in his usual state of health at about 4 AM this morning when his girlfriend noted that he was not acting as he usually does. He was somewhat lethargic. He had been to the bathroom and had to be assisted back to the bedroom. There is no history of falls. No recent illness. No fever. No chills. Patient appeared to be confused to the girlfriend. He started complaining of headache in the back of his head and in back of his neck. He then started reciting random numbers. This concerned the girlfriend and she called the patient's son, who told her to call EMS. EMS was called and by the time they got to the house patient had garbled  speech. CBG was checked and was normal. Within a few minutes of this his symptoms started improving and by the time EMS brought him to the emergency department he was back to his baseline. He denied any focal weakness. Patient cannot recall these events. According to the son, a similar episode happened back in October of 2014, which was thought to be related to medications, and narcotics. However, at that time he took about 24-36 hours to cover.  Patient also tells me that he was placed on prednisone about 6 months ago. Review of his records suggest that he was started on prednisone for back pain. After discussions with his nephrologist a few months ago he started tapering the dose. He tapered it down and has been off of prednisone for the last 2 weeks. Patient was also started on a new medication, most likely Allopurinol, recently. He has taken only one dose of the same. He also mentions, that over the last few weeks he has had generalized muscular pain. He's had generalized weakness. And in the last few days he has also noticed pain in his right wrist and has had difficulty moving it.  Hospital Course:  TIA: The patient was seen by neurology. His MRI did not demonstrate acute stroke. He had a punctate area of remote hemorrhage in the right cerebellum that was nonspecific. His MRI was otherwise normal. He had no significant stenosis, aneurysm, or branch vessel occlusions. His carotid artery duplex demonstrated 1-39% ICA stenosis bilaterally and antegrade vertebral artery flow. Echo demonstrated ejection  fraction of 55-60% with moderate left atrial dilation, moderate tricuspid regurgitation and mild pulmonary artery hypertension with peak pressure of 42 mm of mercury. His bioprosthetic valve appeared to be working properly, no evidence of intracardiac thrombus. Telemetry demonstrated sinus rhythm with frequent ectopy and junctional escape rhythms. Recommend outpatient heart monitoring to determine if he is having  some atrial fibrillation. He was started on full dose aspirin instead of baby aspirin. Lipid panel as below. Hemoglobin A1c of 5.9. PT/OT: Patient are not be set up for therapy as an outpatient via the [place].  Generalized body ache with weakness concerning for PMR. ESR was 72 and CRP 12.2: These could be elevated secondary to his active gout versus RA or PMR. He has had some jaw pain also. Recommend start steroid burst for now but would need a very slow taper over several months. Will give 20 mg twice a day for 7 days followed by 15 mg daily thereafter, with close followup with primary care and possible referral to rheumatology.  Acute gout involving right wrist: Uric acid level 9.9. Continued colchicine and added prednisone. Increased allopurinol to 118m daily.  Chronic kidney disease stage III, creatinine at baseline of 1.4-1.7. Continued lasix.  Hypomagnesemia: likely due to diuretics and received IV and oral supplementation.  CAD and aortic valve replacement: Stable. Continued aspirin, statin, beta blocker  HTN, blood pressure normal to low. Stopped norvasc for now, and he should have his blood pressure repeated by his primary care doctor in a few weeks.  HLD, HDL 31, LDL 62. Start atorvastatin.  Chronic back pain: Avoid narcotics as much as possible considering that he has history of mental status changes due to opioids.  Normocytic anemia: Stable, defer to PCP.  Consultants:  neurology Procedures:  MRI/MRA  Echo  Carotid duplex Antibiotics:  none       Back Pain This is a chronic (acute on chronic) problem. The current episode started more than 1 year ago. The problem occurs constantly. The problem is unchanged. The pain is present in the lumbar spine. The pain is at a severity of 9/10. The pain is severe. The symptoms are aggravated by bending and twisting. Pertinent negatives include no abdominal pain, chest pain or weakness. He has tried NSAIDs, walking, muscle relaxant, analgesics and  bed rest for the symptoms. The treatment provided no relief.  Constipation This is a recurrent problem. The current episode started 1 to 4 weeks ago (3 wks). Associated symptoms include back pain. Pertinent negatives include no abdominal pain, diarrhea or nausea.     BP Readings from Last 3 Encounters:  06/16/14 118/60  06/05/14 147/67  03/24/14 132/64   Wt Readings from Last 3 Encounters:  06/16/14 158 lb (71.668 kg)  06/04/14 157 lb 1.6 oz (71.26 kg)  02/03/14 160 lb (72.576 kg)     Review of Systems  Constitutional: Negative for appetite change, fatigue and unexpected weight change.  HENT: Negative for congestion, nosebleeds, sneezing, sore throat and trouble swallowing.   Eyes: Negative for itching and visual disturbance.  Respiratory: Negative for cough.   Cardiovascular: Negative for chest pain, palpitations and leg swelling.  Gastrointestinal: Positive for constipation. Negative for nausea, abdominal pain, diarrhea, blood in stool and abdominal distention.  Genitourinary: Negative for frequency and hematuria.  Musculoskeletal: Positive for arthralgias, back pain, gait problem and myalgias. Negative for joint swelling and neck pain.  Skin: Negative for rash.  Neurological: Negative for dizziness, tremors, speech difficulty and weakness.  Psychiatric/Behavioral: Negative for suicidal ideas, confusion,  sleep disturbance, dysphoric mood and agitation. The patient is not nervous/anxious.        Objective:   Physical Exam  Constitutional: He is oriented to person, place, and time. He appears well-developed and well-nourished. No distress.  HENT:  Head: Normocephalic and atraumatic.  Right Ear: External ear normal.  Left Ear: External ear normal.  Nose: Nose normal.  Mouth/Throat: Oropharynx is clear and moist. No oropharyngeal exudate.  Eyes: Conjunctivae and EOM are normal. Pupils are equal, round, and reactive to light. Right eye exhibits no discharge. Left eye exhibits  no discharge. No scleral icterus.  Neck: Normal range of motion. Neck supple. No JVD present. No tracheal deviation present. No thyromegaly present.  Cardiovascular: Normal rate, regular rhythm, normal heart sounds and intact distal pulses.  Exam reveals no gallop and no friction rub.   No murmur heard. Pulmonary/Chest: Effort normal and breath sounds normal. No stridor. No respiratory distress. He has no wheezes. He has no rales. He exhibits no tenderness.  Abdominal: Soft. Bowel sounds are normal. He exhibits no distension and no mass. There is no tenderness. There is no rebound and no guarding.  Genitourinary: Rectum normal, prostate normal and penis normal. Guaiac negative stool. No penile tenderness.  Musculoskeletal: Normal range of motion. He exhibits tenderness (LS spine). He exhibits no edema.  Lymphadenopathy:    He has no cervical adenopathy.  Neurological: He is alert and oriented to person, place, and time. He has normal reflexes. No cranial nerve deficit. He exhibits normal muscle tone. Coordination normal.  Skin: Skin is warm and dry. No rash noted. He is not diaphoretic. No erythema. No pallor.  Psychiatric: He has a normal mood and affect. His behavior is normal. Judgment and thought content normal.    Lab Results  Component Value Date   WBC 10.0 06/03/2014   HGB 10.9* 06/03/2014   HCT 33.3* 06/03/2014   PLT 269 06/03/2014   GLUCOSE 124* 06/03/2014   CHOL 115 06/04/2014   TRIG 110 06/04/2014   HDL 31* 06/04/2014   LDLCALC 62 06/04/2014   ALT 15 06/03/2014   AST 28 06/03/2014   NA 139 06/03/2014   K 3.7 06/03/2014   CL 98 06/03/2014   CREATININE 1.47* 06/03/2014   BUN 33* 06/03/2014   CO2 25 06/03/2014   TSH 1.25 12/27/2013   PSA 0.23 12/27/2013   INR 1.49 11/21/2009   HGBA1C 5.9* 06/03/2014    CBG 124     Assessment & Plan:

## 2014-06-16 NOTE — Assessment & Plan Note (Signed)
Cont allopurinol Colcrys prn

## 2014-06-16 NOTE — Progress Notes (Signed)
Pre visit review using our clinic review tool, if applicable. No additional management support is needed unless otherwise documented below in the visit note. 

## 2014-06-16 NOTE — Assessment & Plan Note (Signed)
Labs

## 2014-06-16 NOTE — Assessment & Plan Note (Signed)
Will watch 

## 2014-07-31 ENCOUNTER — Ambulatory Visit (INDEPENDENT_AMBULATORY_CARE_PROVIDER_SITE_OTHER): Payer: Medicare Other | Admitting: Internal Medicine

## 2014-07-31 ENCOUNTER — Encounter: Payer: Self-pay | Admitting: Internal Medicine

## 2014-07-31 VITALS — BP 110/60 | HR 72 | Temp 98.5°F | Resp 16 | Ht 70.0 in | Wt 163.0 lb

## 2014-07-31 DIAGNOSIS — N289 Disorder of kidney and ureter, unspecified: Secondary | ICD-10-CM

## 2014-07-31 DIAGNOSIS — M545 Low back pain, unspecified: Secondary | ICD-10-CM

## 2014-07-31 DIAGNOSIS — M353 Polymyalgia rheumatica: Secondary | ICD-10-CM

## 2014-07-31 DIAGNOSIS — M19011 Primary osteoarthritis, right shoulder: Secondary | ICD-10-CM

## 2014-07-31 DIAGNOSIS — M25519 Pain in unspecified shoulder: Secondary | ICD-10-CM

## 2014-07-31 DIAGNOSIS — I129 Hypertensive chronic kidney disease with stage 1 through stage 4 chronic kidney disease, or unspecified chronic kidney disease: Secondary | ICD-10-CM

## 2014-07-31 DIAGNOSIS — M25511 Pain in right shoulder: Secondary | ICD-10-CM

## 2014-07-31 DIAGNOSIS — M19019 Primary osteoarthritis, unspecified shoulder: Secondary | ICD-10-CM

## 2014-07-31 MED ORDER — METHYLPREDNISOLONE ACETATE 80 MG/ML IJ SUSP
80.0000 mg | Freq: Once | INTRAMUSCULAR | Status: AC
  Administered 2014-07-31: 80 mg via INTRA_ARTICULAR

## 2014-07-31 MED ORDER — METHYLPREDNISOLONE ACETATE 80 MG/ML IJ SUSP: 80.0000 mg | Freq: Once | INTRAMUSCULAR | Status: DC

## 2014-07-31 MED ORDER — TRAMADOL HCL 50 MG PO TABS: 50.0000 mg | ORAL_TABLET | Freq: Two times a day (BID) | ORAL | Status: DC | PRN

## 2014-07-31 MED ORDER — COLCHICINE 0.6 MG PO TABS: 0.6000 mg | ORAL_TABLET | Freq: Every day | ORAL | Status: DC

## 2014-07-31 MED ORDER — PREDNISONE 10 MG PO TABS: 10.0000 mg | ORAL_TABLET | Freq: Every day | ORAL | Status: DC

## 2014-07-31 NOTE — Patient Instructions (Signed)
Reschedule wellness

## 2014-07-31 NOTE — Progress Notes (Signed)
Pre visit review using our clinic review tool, if applicable. No additional management support is needed unless otherwise documented below in the visit note. 

## 2014-07-31 NOTE — Assessment & Plan Note (Signed)
Check Mg

## 2014-07-31 NOTE — Assessment & Plan Note (Signed)
He is on 5 mg/d Prednisone per [facility] now - ?worse Go back to 10 mg a day

## 2014-07-31 NOTE — Assessment & Plan Note (Addendum)
Colchicine seems to help We can try Colchicine 1 a day - risk w/CRF discussed See Procedure

## 2014-07-31 NOTE — Progress Notes (Signed)
Subjective:    C/o gout. Colchicine helps w/R>L shoulder pain but it is too expensive.Marland KitchenMarland KitchenHis mobility is very limited due to pain C/o LBP 8/10 all the time F/u PMR - worse on 5 mg Prednisone C/o fatigue C/o R shoulder pain Colchicine helped neck and shoulder pain   Back Pain This is a chronic (acute on chronic) problem. The current episode started more than 1 month ago. The problem occurs constantly. The problem has been gradually worsening since onset. The pain is present in the lumbar spine. The pain is at a severity of 9/10. The pain is severe. The symptoms are aggravated by bending and twisting. Pertinent negatives include no abdominal pain, chest pain or weakness. He has tried NSAIDs, walking, muscle relaxant, analgesics and bed rest for the symptoms. The treatment provided no relief.  Constipation This is a recurrent problem. The current episode started 1 to 4 weeks ago (3 wks). Associated symptoms include back pain. Pertinent negatives include no abdominal pain, diarrhea or nausea.     BP Readings from Last 3 Encounters:  07/31/14 110/60  06/16/14 118/60  06/05/14 147/67   Wt Readings from Last 3 Encounters:  07/31/14 163 lb (73.936 kg)  06/16/14 158 lb (71.668 kg)  06/04/14 157 lb 1.6 oz (71.26 kg)     Review of Systems  Constitutional: Negative for appetite change, fatigue and unexpected weight change.  HENT: Negative for congestion, nosebleeds, sneezing, sore throat and trouble swallowing.   Eyes: Negative for itching and visual disturbance.  Respiratory: Negative for cough.   Cardiovascular: Negative for chest pain, palpitations and leg swelling.  Gastrointestinal: Positive for constipation. Negative for nausea, abdominal pain, diarrhea, blood in stool and abdominal distention.  Genitourinary: Negative for frequency and hematuria.  Musculoskeletal: Positive for arthralgias, back pain, gait problem and myalgias. Negative for joint swelling and neck pain.  Skin:  Negative for rash.  Neurological: Negative for dizziness, tremors, speech difficulty and weakness.  Psychiatric/Behavioral: Negative for suicidal ideas, confusion, sleep disturbance, dysphoric mood and agitation. The patient is not nervous/anxious.        Objective:   Physical Exam  Constitutional: He is oriented to person, place, and time. He appears well-developed and well-nourished. No distress.  HENT:  Head: Normocephalic and atraumatic.  Right Ear: External ear normal.  Left Ear: External ear normal.  Nose: Nose normal.  Mouth/Throat: Oropharynx is clear and moist. No oropharyngeal exudate.  Eyes: Conjunctivae and EOM are normal. Pupils are equal, round, and reactive to light. Right eye exhibits no discharge. Left eye exhibits no discharge. No scleral icterus.  Neck: Normal range of motion. Neck supple. No JVD present. No tracheal deviation present. No thyromegaly present.  Cardiovascular: Normal rate, regular rhythm, normal heart sounds and intact distal pulses.  Exam reveals no gallop and no friction rub.   No murmur heard. Pulmonary/Chest: Effort normal and breath sounds normal. No stridor. No respiratory distress. He has no wheezes. He has no rales. He exhibits no tenderness.  Abdominal: Soft. Bowel sounds are normal. He exhibits no distension and no mass. There is no tenderness. There is no rebound and no guarding.  Genitourinary: Rectum normal, prostate normal and penis normal. Guaiac negative stool. No penile tenderness.  Musculoskeletal: Normal range of motion. He exhibits tenderness (LS spine). He exhibits no edema.  Lymphadenopathy:    He has no cervical adenopathy.  Neurological: He is alert and oriented to person, place, and time. He has normal reflexes. No cranial nerve deficit. He exhibits normal muscle tone. Coordination  normal.  Skin: Skin is warm and dry. No rash noted. He is not diaphoretic. No erythema. No pallor.  Psychiatric: He has a normal mood and affect. His  behavior is normal. Judgment and thought content normal.  R shoulder is very tender  Lab Results  Component Value Date   WBC 10.0 06/03/2014   HGB 10.9* 06/03/2014   HCT 33.3* 06/03/2014   PLT 269 06/03/2014   GLUCOSE 113* 06/16/2014   CHOL 115 06/04/2014   TRIG 110 06/04/2014   HDL 31* 06/04/2014   LDLCALC 62 06/04/2014   ALT 20 06/16/2014   AST 20 06/16/2014   NA 137 06/16/2014   K 5.5* 06/16/2014   CL 100 06/16/2014   CREATININE 1.2 06/16/2014   BUN 38* 06/16/2014   CO2 29 06/16/2014   TSH 1.25 12/27/2013   PSA 0.23 12/27/2013   INR 1.49 11/21/2009   HGBA1C 5.9* 06/03/2014     Procedure :Joint Injection,  R shoulder   Indication:  Subacromial bursitis with refractory  chronic pain.   Risks including unsuccessful procedure , bleeding, infection, bruising, skin atrophy, "steroid flare-up" and others were explained to the patient in detail as well as the benefits. Informed consent was obtained and signed.   Tthe patient was placed in a comfortable position. Lateral approach was used. Skin was prepped with Betadine and alcohol  and anesthetized with a cooling spray. Then, a 5 cc syringe with a 2 inch long 24-gauge needle was used for a joint injection.. The needle was advanced  Into the subacromial space.The bursa was injected with 3 mL of 2% lidocaine and 40 mg of Depo-Medrol .  Band-Aid was applied.   Tolerated well. Complications: None. Good pain relief following the procedure.   Postprocedure instructions :    A Band-Aid should be left on for 12 hours. Injection therapy is not a cure itself. It is used in conjunction with other modalities. You can use nonsteroidal anti-inflammatories like ibuprofen , hot and cold compresses. Rest is recommended in the next 24 hours. You need to report immediately  if fever, chills or any signs of infection develop.  A complex case     Assessment & Plan:

## 2014-07-31 NOTE — Assessment & Plan Note (Signed)
D/c Allopurinol

## 2014-07-31 NOTE — Assessment & Plan Note (Signed)
Better Watch K level

## 2014-08-21 ENCOUNTER — Other Ambulatory Visit (INDEPENDENT_AMBULATORY_CARE_PROVIDER_SITE_OTHER): Payer: Medicare Other

## 2014-08-21 ENCOUNTER — Encounter: Payer: Self-pay | Admitting: Internal Medicine

## 2014-08-21 ENCOUNTER — Ambulatory Visit (INDEPENDENT_AMBULATORY_CARE_PROVIDER_SITE_OTHER): Payer: Medicare Other | Admitting: Internal Medicine

## 2014-08-21 VITALS — BP 120/68 | HR 76 | Temp 98.3°F | Resp 16 | Wt 163.0 lb

## 2014-08-21 DIAGNOSIS — M545 Low back pain, unspecified: Secondary | ICD-10-CM

## 2014-08-21 DIAGNOSIS — M25511 Pain in right shoulder: Secondary | ICD-10-CM

## 2014-08-21 DIAGNOSIS — I129 Hypertensive chronic kidney disease with stage 1 through stage 4 chronic kidney disease, or unspecified chronic kidney disease: Secondary | ICD-10-CM

## 2014-08-21 DIAGNOSIS — M353 Polymyalgia rheumatica: Secondary | ICD-10-CM

## 2014-08-21 DIAGNOSIS — N289 Disorder of kidney and ureter, unspecified: Secondary | ICD-10-CM

## 2014-08-21 DIAGNOSIS — M25519 Pain in unspecified shoulder: Secondary | ICD-10-CM

## 2014-08-21 LAB — BASIC METABOLIC PANEL
BUN: 36 mg/dL — AB (ref 6–23)
CALCIUM: 9.1 mg/dL (ref 8.4–10.5)
CHLORIDE: 102 meq/L (ref 96–112)
CO2: 30 meq/L (ref 19–32)
CREATININE: 1.4 mg/dL (ref 0.4–1.5)
GFR: 51.19 mL/min — ABNORMAL LOW (ref >60.00)
Glucose, Bld: 86 mg/dL (ref 70–99)
Potassium: 4.1 mEq/L (ref 3.5–5.1)
Sodium: 141 mEq/L (ref 135–145)

## 2014-08-21 LAB — MAGNESIUM: Magnesium: 1.5 mg/dL (ref 1.5–2.5)

## 2014-08-21 LAB — CK: Total CK: 26 U/L (ref 7–232)

## 2014-08-21 LAB — SEDIMENTATION RATE: SED RATE: 32 mm/h — AB (ref 0–22)

## 2014-08-21 MED ORDER — PREDNISONE 10 MG PO TABS: 15.0000 mg | ORAL_TABLET | Freq: Every day | ORAL | Status: DC

## 2014-08-21 MED ORDER — PREDNISONE 1 MG PO TABS
ORAL_TABLET | ORAL | Status: DC
Start: 2014-08-21 — End: 2014-12-25

## 2014-08-21 MED ORDER — COLCHICINE 0.6 MG PO TABS: 0.6000 mg | ORAL_TABLET | Freq: Every day | ORAL | Status: DC

## 2014-08-21 NOTE — Patient Instructions (Signed)
Use Prednisone 15 mg a day for 1 month, then start 14 mg a day. Reduce by 1 mg a day each month.

## 2014-08-21 NOTE — Progress Notes (Signed)
Pre visit review using our clinic review tool, if applicable. No additional management support is needed unless otherwise documented below in the visit note. 

## 2014-08-21 NOTE — Assessment & Plan Note (Signed)
Use Prednisone 15 mg a day for 1 month, then start 14 mg a day. Reduce by 1 mg a day each month. 

## 2014-08-24 NOTE — Progress Notes (Signed)
Subjective:    C/o gout. Colchicine helps w/R>L shoulder pain but it is too expensive.Marland KitchenMarland KitchenHis mobility is very limited due to pain C/o LBP 8/10 all the time F/u PMR - worse on 5 mg Prednisone, better on 10 mg/d C/o fatigue C/o R>L shoulder pain Colchicine helped neck and shoulder pain   Back Pain This is a chronic (acute on chronic) problem. The current episode started more than 1 month ago. The problem occurs constantly. The problem has been gradually worsening since onset. The pain is present in the lumbar spine. The pain is at a severity of 9/10. The pain is severe. The symptoms are aggravated by bending and twisting. Pertinent negatives include no abdominal pain, chest pain or weakness. He has tried NSAIDs, walking, muscle relaxant, analgesics and bed rest for the symptoms. The treatment provided no relief.  Constipation This is a recurrent problem. The current episode started 1 to 4 weeks ago (3 wks). Associated symptoms include back pain. Pertinent negatives include no abdominal pain, diarrhea or nausea.     BP Readings from Last 3 Encounters:  08/21/14 120/68  07/31/14 110/60  06/16/14 118/60   Wt Readings from Last 3 Encounters:  08/21/14 163 lb (73.936 kg)  07/31/14 163 lb (73.936 kg)  06/16/14 158 lb (71.668 kg)     Review of Systems  Constitutional: Negative for appetite change, fatigue and unexpected weight change.  HENT: Negative for congestion, nosebleeds, sneezing, sore throat and trouble swallowing.   Eyes: Negative for itching and visual disturbance.  Respiratory: Negative for cough.   Cardiovascular: Negative for chest pain, palpitations and leg swelling.  Gastrointestinal: Positive for constipation. Negative for nausea, abdominal pain, diarrhea, blood in stool and abdominal distention.  Genitourinary: Negative for frequency and hematuria.  Musculoskeletal: Positive for arthralgias, back pain, gait problem and myalgias. Negative for joint swelling and neck  pain.  Skin: Negative for rash.  Neurological: Negative for dizziness, tremors, speech difficulty and weakness.  Psychiatric/Behavioral: Negative for suicidal ideas, confusion, sleep disturbance, dysphoric mood and agitation. The patient is not nervous/anxious.        Objective:   Physical Exam  Constitutional: He is oriented to person, place, and time. He appears well-developed and well-nourished. No distress.  HENT:  Head: Normocephalic and atraumatic.  Right Ear: External ear normal.  Left Ear: External ear normal.  Nose: Nose normal.  Mouth/Throat: Oropharynx is clear and moist. No oropharyngeal exudate.  Eyes: Conjunctivae and EOM are normal. Pupils are equal, round, and reactive to light. Right eye exhibits no discharge. Left eye exhibits no discharge. No scleral icterus.  Neck: Normal range of motion. Neck supple. No JVD present. No tracheal deviation present. No thyromegaly present.  Cardiovascular: Normal rate, regular rhythm, normal heart sounds and intact distal pulses.  Exam reveals no gallop and no friction rub.   No murmur heard. Pulmonary/Chest: Effort normal and breath sounds normal. No stridor. No respiratory distress. He has no wheezes. He has no rales. He exhibits no tenderness.  Abdominal: Soft. Bowel sounds are normal. He exhibits no distension and no mass. There is no tenderness. There is no rebound and no guarding.  Genitourinary: Rectum normal, prostate normal and penis normal. Guaiac negative stool. No penile tenderness.  Musculoskeletal: Normal range of motion. He exhibits tenderness (LS spine). He exhibits no edema.  Lymphadenopathy:    He has no cervical adenopathy.  Neurological: He is alert and oriented to person, place, and time. He has normal reflexes. No cranial nerve deficit. He exhibits normal muscle  tone. Coordination normal.  Skin: Skin is warm and dry. No rash noted. He is not diaphoretic. No erythema. No pallor.  Psychiatric: He has a normal mood and  affect. His behavior is normal. Judgment and thought content normal.  R shoulder is less tender  Lab Results  Component Value Date   WBC 10.0 06/03/2014   HGB 10.9* 06/03/2014   HCT 33.3* 06/03/2014   PLT 269 06/03/2014   GLUCOSE 86 08/21/2014   CHOL 115 06/04/2014   TRIG 110 06/04/2014   HDL 31* 06/04/2014   LDLCALC 62 06/04/2014   ALT 20 06/16/2014   AST 20 06/16/2014   NA 141 08/21/2014   K 4.1 08/21/2014   CL 102 08/21/2014   CREATININE 1.4 08/21/2014   BUN 36* 08/21/2014   CO2 30 08/21/2014   TSH 1.25 12/27/2013   PSA 0.23 12/27/2013   INR 1.49 11/21/2009   HGBA1C 5.9* 06/03/2014       Assessment & Plan:

## 2014-09-28 ENCOUNTER — Ambulatory Visit (INDEPENDENT_AMBULATORY_CARE_PROVIDER_SITE_OTHER): Payer: Medicare Other | Admitting: Internal Medicine

## 2014-09-28 ENCOUNTER — Other Ambulatory Visit (INDEPENDENT_AMBULATORY_CARE_PROVIDER_SITE_OTHER): Payer: Medicare Other

## 2014-09-28 ENCOUNTER — Encounter: Payer: Self-pay | Admitting: Internal Medicine

## 2014-09-28 VITALS — BP 138/72 | HR 98 | Temp 98.2°F | Ht 70.5 in | Wt 165.0 lb

## 2014-09-28 DIAGNOSIS — R259 Unspecified abnormal involuntary movements: Secondary | ICD-10-CM

## 2014-09-28 DIAGNOSIS — I951 Orthostatic hypotension: Secondary | ICD-10-CM

## 2014-09-28 DIAGNOSIS — Z8739 Personal history of other diseases of the musculoskeletal system and connective tissue: Secondary | ICD-10-CM

## 2014-09-28 DIAGNOSIS — R258 Other abnormal involuntary movements: Secondary | ICD-10-CM

## 2014-09-28 DIAGNOSIS — E873 Alkalosis: Secondary | ICD-10-CM

## 2014-09-28 HISTORY — DX: Personal history of other diseases of the musculoskeletal system and connective tissue: Z87.39

## 2014-09-28 LAB — BASIC METABOLIC PANEL
BUN: 35 mg/dL — AB (ref 6–23)
CALCIUM: 9.1 mg/dL (ref 8.4–10.5)
CO2: 31 mEq/L (ref 19–32)
CREATININE: 1.5 mg/dL (ref 0.4–1.5)
Chloride: 102 mEq/L (ref 96–112)
GFR: 46.54 mL/min — AB (ref >60.00)
GLUCOSE: 131 mg/dL — AB (ref 70–99)
Potassium: 3.8 mEq/L (ref 3.5–5.1)
Sodium: 139 mEq/L (ref 135–145)

## 2014-09-28 LAB — CBC WITH DIFFERENTIAL/PLATELET
Basophils Absolute: 0 10*3/uL (ref 0.0–0.1)
Basophils Relative: 0.4 % (ref 0.0–3.0)
EOS PCT: 3.5 % (ref 0.0–5.0)
Eosinophils Absolute: 0.4 10*3/uL (ref 0.0–0.7)
HCT: 39.4 % (ref 39.0–52.0)
HEMOGLOBIN: 13.1 g/dL (ref 13.0–17.0)
LYMPHS ABS: 1.6 10*3/uL (ref 0.7–4.0)
LYMPHS PCT: 13.5 % (ref 12.0–46.0)
MCHC: 33.2 g/dL (ref 30.0–36.0)
MCV: 96.8 fl (ref 78.0–100.0)
MONOS PCT: 11 % (ref 3.0–12.0)
Monocytes Absolute: 1.3 10*3/uL — ABNORMAL HIGH (ref 0.1–1.0)
NEUTROS ABS: 8.4 10*3/uL — AB (ref 1.4–7.7)
Neutrophils Relative %: 71.6 % (ref 43.0–77.0)
Platelets: 204 10*3/uL (ref 150.0–400.0)
RBC: 4.07 Mil/uL — ABNORMAL LOW (ref 4.22–5.81)
RDW: 13.5 % (ref 11.5–15.5)
WBC: 11.8 10*3/uL — AB (ref 4.0–10.5)

## 2014-09-28 LAB — URINALYSIS, ROUTINE W REFLEX MICROSCOPIC
Bilirubin Urine: NEGATIVE
Hgb urine dipstick: NEGATIVE
KETONES UR: NEGATIVE
LEUKOCYTES UA: NEGATIVE
Nitrite: NEGATIVE
PH: 6 (ref 5.0–8.0)
SPECIFIC GRAVITY, URINE: 1.01 (ref 1.000–1.030)
Total Protein, Urine: NEGATIVE
Urine Glucose: NEGATIVE
Urobilinogen, UA: 0.2 (ref 0.0–1.0)

## 2014-09-28 LAB — HEPATIC FUNCTION PANEL
ALBUMIN: 3.7 g/dL (ref 3.5–5.2)
ALT: 18 U/L (ref 0–53)
AST: 22 U/L (ref 0–37)
Alkaline Phosphatase: 47 U/L (ref 39–117)
Bilirubin, Direct: 0.1 mg/dL (ref 0.0–0.3)
Total Bilirubin: 0.4 mg/dL (ref 0.2–1.2)
Total Protein: 6.9 g/dL (ref 6.0–8.3)

## 2014-09-28 LAB — MAGNESIUM: Magnesium: 1.4 mg/dL — ABNORMAL LOW (ref 1.5–2.5)

## 2014-09-28 LAB — TSH: TSH: 3.91 u[IU]/mL (ref 0.35–4.50)

## 2014-09-28 NOTE — Progress Notes (Signed)
Pre visit review using our clinic review tool, if applicable. No additional management support is needed unless otherwise documented below in the visit note. 

## 2014-09-28 NOTE — Patient Instructions (Signed)
Your Blood Pressure does fall somewhat when you stand, and you were dizzy as well with sitting up today  Please hold the Lasix for 5 days, and drink a bit more fluids in the next few days.  Please continue all other medications as before, and refills have been done if requested.  Please have the pharmacy call with any other refills you may need.  Please keep your appointments with your specialists as you may have planned  Please go to the LAB in the Basement (turn left off the elevator) for the tests to be done today  You will be contacted by phone if any changes need to be made immediately.  Otherwise, you will receive a letter about your results with an explanation, but please check with MyChart first.  Please return in 2 weeks to Dr Posey ReaPlotnikov to re-assess

## 2014-09-28 NOTE — Progress Notes (Signed)
Subjective:    Patient ID: Adam Weeks, male    DOB: 1927-06-25, 80 y.o.   MRN: [id number]  HPI  Here to f/u, PCP is out of office,  C/o "the jerks" over the last 2 days, but actually improved late yesterday and today.  Motions what he means by jerking shoulders and torso about mildly.   Was tx for low Mg in hosp last spring when hospd with dehydration, so then yesterday took a wife's Mg supplement, and ? Better today.  Pt denies chest pain, increased sob or doe, wheezing, orthopnea, PND, increased LE swelling, palpitations, dizziness or syncope. Does have a heart monitor placed at the [facility] yesterday, for palpitations at night. No pain, no high fevers, though might have felt warm yesterday for unclear reasons after a trying day at the [facility].   Pt denies fever, wt loss, night sweats, loss of appetite, or other constitutional symptoms. Did sleep better last night, after could not sleep not sleep well the two prior nights due to the jerking.  Had some jerks years ago thought related to gabapentin but has not taken in at least 5 yrs.  No hx of Parkisons, tremors, neuromuscular dz. Is on diuretic with hx of low K, Mg, Not clear if pt keeps up with fluids well. Does have some mild orthostasis on getting up in the am, not much overall during the day.  Pt continues to have recurring LBP without change in severity, bowel or bladder change, fever, wt loss,  worsening LE pain/numbness/weakness, gait change or falls, has ongoing chronic pain, tried ultram, did not work, has hx of significant narcotic exposure from [facility] - methadone, vicodin, morphine. Had recent PT locally per the [facility] but did not help.  Did see local ortho - s/p nerve ablation but did not help as well. No help with lidoderm as well. Denies worsening depressive symptoms, suicidal ideation, or panic.  Past Medical History  Diagnosis Date  . Osteoporosis   . GERD (gastroesophageal reflux disease)   . Hyperlipidemia   . Hypertension   . CAD (coronary artery  disease)   . Chronic kidney disease     stones   Past Surgical History  Procedure Laterality Date  . Cardiac valve replacement  2009    pig valve -- AVR  . Coronary artery bypass graft      2009  . Hernia repair      reports that he has quit smoking. He does not have any smokeless tobacco history on file. He reports that he does not drink alcohol or use illicit drugs. family history includes Cancer (age of onset: 8079) in his mother; Heart disease in his father. Allergies  Allergen Reactions  . Gabapentin     jumpy   Review of Systems  Constitutional: Negative for unusual diaphoresis or other sweats  HENT: Negative for ringing in ear Eyes: Negative for double vision or worsening visual disturbance.  Respiratory: Negative for choking and stridor.   Gastrointestinal: Negative for vomiting or other signifcant bowel change Genitourinary: Negative for hematuria or decreased urine volume.  Musculoskeletal: Negative for other MSK pain or swelling Skin: Negative for color change and worsening wound.  Neurological: Negative for tremors and numbness other than noted  Psychiatric/Behavioral: Negative for decreased concentration or agitation other than above       Objective:   Physical Exam BP 120/78  Pulse 98  Temp(Src) 98.2 F (36.8 C) (Oral)  Ht 5' 10.5" (1.791 m)  Wt 165 lb (74.844 kg)  BMI 23.33 kg/m2  SpO2 94% VS noted,  Constitutional: Pt appears well-developed, well-nourished.  HENT: Head: NCAT.  Right Ear: External ear normal.  Left Ear: External ear normal.  Eyes: . Pupils are equal, round, and reactive to light. Conjunctivae and EOM are normal Neck: Normal range of motion. Neck supple.  Cardiovascular: Normal rate and regular rhythm.   Pulmonary/Chest: Effort normal and breath sounds normal.  Abd:  Soft, NT, ND, + BS Neurological: Pt is alert. Not confused , motor intact, no overt tremor or involuntary movements/spasms today Skin: Skin is warm. No rash, no LE  edema Psychiatric: Pt behavior is normal. No agitation.     Assessment & Plan:

## 2014-09-28 NOTE — Assessment & Plan Note (Signed)
Will also need to be ruled out again on chronic lasix tx

## 2014-09-28 NOTE — Assessment & Plan Note (Addendum)
By hx, none on exam today, but pt and wife are reliable, had some mild orthostasis noted on exam today, to hold lasix x 5 days, encouraged po fluids increased somewhat next few days, also for check Mg, K, Ca, bmet, cbc  - may need increased po supplementation lytes or minerals.,consider neuro referral if recurs  Note:  Total time for pt hx, exam, review of record with pt in the room, determination of diagnoses and plan for further eval and tx is > 40 min, with over 50% spent in coordination and counseling of patient

## 2014-09-28 NOTE — Assessment & Plan Note (Signed)
Mild, tx as above

## 2014-10-23 ENCOUNTER — Ambulatory Visit (INDEPENDENT_AMBULATORY_CARE_PROVIDER_SITE_OTHER): Payer: Medicare Other | Admitting: Internal Medicine

## 2014-10-23 ENCOUNTER — Encounter: Payer: Self-pay | Admitting: Internal Medicine

## 2014-10-23 VITALS — BP 120/70 | HR 84 | Temp 98.3°F | Resp 16 | Wt 169.0 lb

## 2014-10-23 DIAGNOSIS — N19 Unspecified kidney failure: Secondary | ICD-10-CM

## 2014-10-23 DIAGNOSIS — M353 Polymyalgia rheumatica: Secondary | ICD-10-CM

## 2014-10-23 DIAGNOSIS — I120 Hypertensive chronic kidney disease with stage 5 chronic kidney disease or end stage renal disease: Secondary | ICD-10-CM

## 2014-10-23 DIAGNOSIS — N039 Chronic nephritic syndrome with unspecified morphologic changes: Secondary | ICD-10-CM

## 2014-10-23 DIAGNOSIS — Z23 Encounter for immunization: Secondary | ICD-10-CM

## 2014-10-23 MED ORDER — MAGNESIUM 200 MG PO TABS: ORAL_TABLET | ORAL | Status: AC

## 2014-10-23 NOTE — Assessment & Plan Note (Signed)
Recurrent - due to Furosemide Take Mg daily Labs

## 2014-10-23 NOTE — Progress Notes (Signed)
Subjective:   C/o low magnesium and cramps: took Mg F/u gout. Colchicine helps w/R>L shoulder pain but it is too expensive.[name].[name].His mobility is very limited due to pain F/u LBP 8/10 all the time F/u PMR - worse on 5 mg Prednisone, better on 10 mg/d F/u fatigue F/u R>L shoulder pain Colchicine helped neck and shoulder pain   Back Pain This is a chronic (acute on chronic) problem. The current episode started more than 1 month ago. The problem occurs constantly. The problem has been gradually worsening since onset. The pain is present in the lumbar spine. The pain is at a severity of 9/10. The pain is severe. The symptoms are aggravated by bending and twisting. Pertinent negatives include no abdominal pain, chest pain or weakness. He has tried NSAIDs, walking, muscle relaxant, analgesics and bed rest for the symptoms. The treatment provided no relief.  Constipation This is a recurrent problem. The current episode started 1 to 4 weeks ago (3 wks). Associated symptoms include back pain. Pertinent negatives include no abdominal pain, diarrhea or nausea.     BP Readings from Last 3 Encounters:  10/23/14 120/70  09/28/14 138/72  08/21/14 120/68   Wt Readings from Last 3 Encounters:  10/23/14 169 lb (76.658 kg)  09/28/14 165 lb (74.844 kg)  08/21/14 163 lb (73.936 kg)     Review of Systems  Constitutional: Negative for appetite change, fatigue and unexpected weight change.  HENT: Negative for congestion, nosebleeds, sneezing, sore throat and trouble swallowing.   Eyes: Negative for itching and visual disturbance.  Respiratory: Negative for cough.   Cardiovascular: Negative for chest pain, palpitations and leg swelling.  Gastrointestinal: Positive for constipation. Negative for nausea, abdominal pain, diarrhea, blood in stool and abdominal distention.  Genitourinary: Negative for frequency and hematuria.  Musculoskeletal: Positive for arthralgias, back pain, gait problem and myalgias.  Negative for joint swelling and neck pain.  Skin: Negative for rash.  Neurological: Negative for dizziness, tremors, speech difficulty and weakness.  Psychiatric/Behavioral: Negative for suicidal ideas, confusion, sleep disturbance, dysphoric mood and agitation. The patient is not nervous/anxious.        Objective:   Physical Exam  Constitutional: He is oriented to person, place, and time. He appears well-developed and well-nourished. No distress.  HENT:  Head: Normocephalic and atraumatic.  Right Ear: External ear normal.  Left Ear: External ear normal.  Nose: Nose normal.  Mouth/Throat: Oropharynx is clear and moist. No oropharyngeal exudate.  Eyes: Conjunctivae and EOM are normal. Pupils are equal, round, and reactive to light. Right eye exhibits no discharge. Left eye exhibits no discharge. No scleral icterus.  Neck: Normal range of motion. Neck supple. No JVD present. No tracheal deviation present. No thyromegaly present.  Cardiovascular: Normal rate, regular rhythm, normal heart sounds and intact distal pulses.  Exam reveals no gallop and no friction rub.   No murmur heard. Pulmonary/Chest: Effort normal and breath sounds normal. No stridor. No respiratory distress. He has no wheezes. He has no rales. He exhibits no tenderness.  Abdominal: Soft. Bowel sounds are normal. He exhibits no distension and no mass. There is no tenderness. There is no rebound and no guarding.  Genitourinary: Rectum normal, prostate normal and penis normal. Guaiac negative stool. No penile tenderness.  Musculoskeletal: Normal range of motion. He exhibits tenderness (LS spine). He exhibits no edema.  Lymphadenopathy:    He has no cervical adenopathy.  Neurological: He is alert and oriented to person, place, and time. He has normal reflexes. No cranial  nerve deficit. He exhibits normal muscle tone. Coordination normal.  Skin: Skin is warm and dry. No rash noted. He is not diaphoretic. No erythema. No pallor.   Psychiatric: He has a normal mood and affect. His behavior is normal. Judgment and thought content normal.  R shoulder is less tender  Lab Results  Component Value Date   WBC 11.8* 09/28/2014   HGB 13.1 09/28/2014   HCT 39.4 09/28/2014   PLT 204.0 09/28/2014   GLUCOSE 131* 09/28/2014   CHOL 115 06/04/2014   TRIG 110 06/04/2014   HDL 31* 06/04/2014   LDLCALC 62 06/04/2014   ALT 18 09/28/2014   AST 22 09/28/2014   NA 139 09/28/2014   K 3.8 09/28/2014   CL 102 09/28/2014   CREATININE 1.5 09/28/2014   BUN 35* 09/28/2014   CO2 31 09/28/2014   TSH 3.91 09/28/2014   PSA 0.23 12/27/2013   INR 1.49 11/21/2009   HGBA1C 5.9* 06/03/2014       Assessment & Plan:

## 2014-10-23 NOTE — Assessment & Plan Note (Signed)
Cautious w/meds

## 2014-10-23 NOTE — Progress Notes (Signed)
Pre visit review using our clinic review tool, if applicable. No additional management support is needed unless otherwise documented below in the visit note. 

## 2014-10-23 NOTE — Assessment & Plan Note (Signed)
Continue with current prescription therapy as reflected on the Med list.  

## 2014-11-14 ENCOUNTER — Ambulatory Visit: Payer: Medicare Other | Admitting: Internal Medicine

## 2014-12-11 ENCOUNTER — Telehealth: Payer: Self-pay | Admitting: Internal Medicine

## 2014-12-11 NOTE — Telephone Encounter (Signed)
Pt called in and wanted to know if Dr Macario GoldsPlot could work him in today for a shot in his shoulder.  He said he can not move it.  I told him I would have to ask because he has a full day.     Best number is [phone]

## 2014-12-11 NOTE — Telephone Encounter (Signed)
I could w/in on Thur after 1 pm Thx

## 2014-12-11 NOTE — Telephone Encounter (Signed)
Louis MatteFrances Weeks is calling as [name] has not rec'd a call back. He wants to be worked in TODAY between patients for an injection. I informed message was sent earlier today and that the office closes at 5pm. She asked I resend the message.  [phone]

## 2014-12-12 NOTE — Telephone Encounter (Signed)
Left message on vm regarding appt

## 2014-12-14 ENCOUNTER — Ambulatory Visit (INDEPENDENT_AMBULATORY_CARE_PROVIDER_SITE_OTHER): Payer: Medicare Other | Admitting: Internal Medicine

## 2014-12-14 ENCOUNTER — Encounter: Payer: Self-pay | Admitting: Internal Medicine

## 2014-12-14 ENCOUNTER — Ambulatory Visit: Payer: Medicare Other | Admitting: Family Medicine

## 2014-12-14 VITALS — BP 120/78 | HR 93 | Temp 98.7°F | Wt 167.0 lb

## 2014-12-14 DIAGNOSIS — M129 Arthropathy, unspecified: Secondary | ICD-10-CM

## 2014-12-14 DIAGNOSIS — M545 Low back pain: Secondary | ICD-10-CM

## 2014-12-14 DIAGNOSIS — M353 Polymyalgia rheumatica: Secondary | ICD-10-CM

## 2014-12-14 DIAGNOSIS — Z8739 Personal history of other diseases of the musculoskeletal system and connective tissue: Secondary | ICD-10-CM

## 2014-12-14 DIAGNOSIS — Z8639 Personal history of other endocrine, nutritional and metabolic disease: Secondary | ICD-10-CM

## 2014-12-14 DIAGNOSIS — M19011 Primary osteoarthritis, right shoulder: Secondary | ICD-10-CM

## 2014-12-14 DIAGNOSIS — M25511 Pain in right shoulder: Secondary | ICD-10-CM

## 2014-12-14 MED ORDER — HYDROMORPHONE HCL 2 MG PO TABS: 2.0000 mg | ORAL_TABLET | Freq: Three times a day (TID) | ORAL | Status: DC | PRN

## 2014-12-14 MED ORDER — PREDNISONE 10 MG PO TABS: 15.0000 mg | ORAL_TABLET | Freq: Every day | ORAL | Status: DC

## 2014-12-14 MED ORDER — METHYLPREDNISOLONE ACETATE 40 MG/ML IJ SUSP: 40.0000 mg | Freq: Once | INTRAMUSCULAR | Status: DC

## 2014-12-14 MED ORDER — COLCHICINE 0.6 MG PO TABS: 0.6000 mg | ORAL_TABLET | Freq: Every day | ORAL | Status: DC

## 2014-12-14 NOTE — Assessment & Plan Note (Signed)
Worse - severe, uncontrolled Discussed. Will try Dilaudid - low dose. Potential benefits of a long term opioids and steroid  use as well as potential risks  and complications were explained to the patient and were aknowledged.

## 2014-12-14 NOTE — Progress Notes (Signed)
Subjective:   C/o bad R shoulder pain - worse F/u gout. Colchicine helps w/R>L shoulder pain but it is too expensive.[name].[name].His mobility is very limited due to pain F/u LBP 8/10 all the time F/u PMR - worse on 5 mg Prednisone, better on 10 mg/d. OA pains are worse - 10/10 can't bear it now... F/u fatigue F/u R>L shoulder pain Colchicine helped neck and shoulder pain   Back Pain This is a chronic problem. The current episode started more than 1 year ago. The problem has been gradually worsening since onset. The pain is severe. The symptoms are aggravated by sitting, bending, standing and twisting. Associated symptoms include leg pain. Pertinent negatives include no bladder incontinence, chest pain, paresthesias or weakness.     BP Readings from Last 3 Encounters:  12/14/14 120/78  10/23/14 120/70  09/28/14 138/72   Wt Readings from Last 3 Encounters:  12/14/14 167 lb (75.751 kg)  10/23/14 169 lb (76.658 kg)  09/28/14 165 lb (74.844 kg)     Review of Systems  Constitutional: Negative for appetite change, fatigue and unexpected weight change.  HENT: Negative for congestion, nosebleeds, sneezing, sore throat and trouble swallowing.   Eyes: Negative for itching and visual disturbance.  Respiratory: Negative for cough.   Cardiovascular: Negative for chest pain, palpitations and leg swelling.  Gastrointestinal: Negative for blood in stool and abdominal distention.  Genitourinary: Negative for bladder incontinence, frequency and hematuria.  Musculoskeletal: Positive for myalgias, arthralgias and gait problem. Negative for joint swelling and neck pain.  Skin: Negative for rash.  Neurological: Negative for dizziness, tremors, speech difficulty, weakness and paresthesias.  Psychiatric/Behavioral: Negative for suicidal ideas, confusion, sleep disturbance, dysphoric mood and agitation. The patient is not nervous/anxious.        Objective:   Physical Exam  Constitutional: He is oriented  to person, place, and time. He appears well-developed. No distress.  NAD  HENT:  Mouth/Throat: Oropharynx is clear and moist.  Eyes: Conjunctivae are normal. Pupils are equal, round, and reactive to light.  Neck: Normal range of motion. No JVD present. No thyromegaly present.  Cardiovascular: Normal rate, regular rhythm, normal heart sounds and intact distal pulses.  Exam reveals no gallop and no friction rub.   No murmur heard. Pulmonary/Chest: Effort normal and breath sounds normal. No respiratory distress. He has no wheezes. He has no rales. He exhibits no tenderness.  Abdominal: Soft. Bowel sounds are normal. He exhibits no distension and no mass. There is no tenderness. There is no rebound and no guarding.  Musculoskeletal: Normal range of motion. He exhibits no edema or tenderness.  Lymphadenopathy:    He has no cervical adenopathy.  Neurological: He is alert and oriented to person, place, and time. He has normal reflexes. No cranial nerve deficit. He exhibits normal muscle tone. He displays a negative Romberg sign. Coordination and gait normal.  No meningeal signs  Skin: Skin is warm and dry. No rash noted.  Psychiatric: He has a normal mood and affect. His behavior is normal. Judgment and thought content normal.  R shoulder is tender LS tender  Lab Results  Component Value Date   WBC 11.8* 09/28/2014   HGB 13.1 09/28/2014   HCT 39.4 09/28/2014   PLT 204.0 09/28/2014   GLUCOSE 131* 09/28/2014   CHOL 115 06/04/2014   TRIG 110 06/04/2014   HDL 31* 06/04/2014   LDLCALC 62 06/04/2014   ALT 18 09/28/2014   AST 22 09/28/2014   NA 139 09/28/2014   K 3.8  09/28/2014   CL 102 09/28/2014   CREATININE 1.5 09/28/2014   BUN 35* 09/28/2014   CO2 31 09/28/2014   TSH 3.91 09/28/2014   PSA 0.23 12/27/2013   INR 1.49 11/21/2009   HGBA1C 5.9* 06/03/2014    Procedure :Joint Injection,  right shoulder   Subacromial bursitis with refractory  chronic pain.   Risks including  unsuccessful procedure , bleeding, infection, bruising, skin atrophy, "steroid flare-up" and others were explained to the patient in detail as well as the benefits. Informed consent was obtained and signed.   Tthe patient was placed in a comfortable position. Lateral approach was used. Skin was prepped with Betadine and alcohol  and anesthetized with a cooling spray. Then, a 5 cc syringe with a 2 inch long 24-gauge needle was used for a joint injection.. The needle was advanced  Into the subacromial space.The bursa was injected with 3 mL of 2% lidocaine and 40 mg of Depo-Medrol .  Band-Aid was applied.   Tolerated well. Complications: None. Good pain relief following the procedure.   Postprocedure instructions :    A Band-Aid should be left on for 12 hours. Injection therapy is not a cure itself. It is used in conjunction with other modalities. You can use nonsteroidal anti-inflammatories like ibuprofen , hot and cold compresses. Rest is recommended in the next 24 hours. You need to report immediately  if fever, chills or any signs of infection develop.      Assessment & Plan:

## 2014-12-14 NOTE — Assessment & Plan Note (Signed)
See Procedure 

## 2014-12-14 NOTE — Patient Instructions (Addendum)
Don't drive when taking dilaudid   Polymyalgia Rheumatica Polymyalgia rheumatica (also called PMR or polymyalgia) is a rheumatologic (arthritic) condition that causes pain and morning stiffness in your neck, shoulders, and hips. It is an inflammatory condition. In some people, inflammation of certain structures in the shoulder, hips, or other joints can be seen on special testing. It does not cause joint destruction, as occurs in other arthritic conditions. It usually occurs after 78 years of age, and is more common as you age. It can be confused with several other diseases, but it is usually easily treated. People with PMR often have, or can develop, a more severe rheumatologic condition called giant cell arteritis (also called CGA or temporal arteritis).  CAUSES  The exact cause of PMR is not known.   There are genetic factors involved.  Viruses have been suspected in the cause of PMR. This has not been proven. SYMPTOMS   Aching, pain, and morning stiffness your neck, both shoulders, or both hips.  Symptoms usually start slowly and build gradually.  Morning stiffness usually lasts at least 30 minutes.  Swelling and tenderness in other joints of the arms, hands, legs, and feet may occur.  Swelling and inflammation in the wrists can cause nerve inflammation at the wrist (carpal tunnel syndrome).  You may also have low grade fever, fatigue, weakness, decreased appetite and weight loss. DIAGNOSIS   Your caregiver may suspect that you have PMR based on your description of your symptoms and on your exam.  Your caregiver will examine you to be sure you do not have diseases that can be confused with PMR. These diseases include rheumatoid arthritis, fibromyalgia, or thyroid disease.  Your caregiver should check for signs of giant cell arteritis. This can cause serious complications such as blindness.  Lab tests can help confirm that you have PMR and not other diseases, but are sometimes  inconclusive.  X-rays cannot show PMR. However, it can identify other diseases like rheumatoid arthritis. Your caregiver may have you see a specialist in arthritis and inflammatory diseases (rheumatologist). TREATMENT  The goal of treatment is relief of symptoms. Treatment does not shorten the course of the illness or prevent complications. With proper treatment, you usually feel better almost right away.   The initial treatment of PMR is usually a cortisone (steroid) medication. Your caregiver will help determine a starting dose. The dose is gradually reduced every few weeks to months. Treatment usually lasts one to three years.  Other stronger medications are rarely needed. They will only be prescribed if your symptoms do not get better on cortisone medication alone, or if they recur as the dose is reduced.  Cortisone medication can have different side effects. With the doses of cortisone needed for PMR, the side effects can affect bones and joints, blood sugar control in diabetes, and mood changes. Discuss this with your caregiver.  Your caregiver will evaluate you regularly during your treatment. They will do this in order to assess progress and to check for complications of the illness or treatment.  Physical therapy is sometimes useful. This is especially true if your joints are still stiff after other symptoms have improved. HOME CARE INSTRUCTIONS   Follow your caregiver's instructions. Do not change your dose of cortisone medication on your own.  Keep your appointments for follow-up lab tests and caregiver visits. Your lab tests need to be monitored. You must get checked periodically for giant cell arteritis.  Follow your caregiver's guidance regarding physical activity (usually no restrictions are  needed) or physical therapy.  Your caregiver may have instructions to prevent or check for side effects from cortisone medication (including bone density testing or treatment). Follow their  instructions carefully. SEEK MEDICAL CARE IF:   You develop any side effects from treatment. Side effects can include:  Elevated blood pressure.  High blood sugar (or worsening of diabetes, if you are diabetic).  Difficulty fighting off infections.  Weight gain.  Weakness of the bones (osteoporosis).  Your aches, pains, morning stiffness, or other symptoms get worse with time. This is especially true after your dose of cortisone is reduced.  You develop new joint symptoms (pain, swelling, etc.) SEEK IMMEDIATE MEDICAL CARE IF:   You develop a severe headache.  You start vomiting.  You have problems with your vision.  You have an oral temperature above 102 F (38.9 C), not controlled by medicine. Document Released: 01/22/2005 Document Revised: 12/01/2012 Document Reviewed: 05/07/2009 [facility] Patient Information 2015 [place], [place]. This information is not intended to replace advice given to you by your health care provider. Make sure you discuss any questions you have with your health care provider.  Postprocedure instructions :    A Band-Aid should be left on for 12 hours. Injection therapy is not a cure itself. It is used in conjunction with other modalities. You can use nonsteroidal anti-inflammatories like ibuprofen , hot and cold compresses. Rest is recommended in the next 24 hours. You need to report immediately  if fever, chills or any signs of infection develop.

## 2014-12-14 NOTE — Progress Notes (Signed)
Pre visit review using our clinic review tool, if applicable. No additional management support is needed unless otherwise documented below in the visit note. 

## 2014-12-14 NOTE — Assessment & Plan Note (Signed)
Potential benefits of a long term steroid  use as well as potential risks  and complications were explained to the patient and were aknowledged.  Continue with current steroid dose as reflected on the Med list.

## 2014-12-14 NOTE — Assessment & Plan Note (Signed)
On Colchicine - it is helping

## 2014-12-25 ENCOUNTER — Encounter: Payer: Self-pay | Admitting: Internal Medicine

## 2014-12-25 ENCOUNTER — Ambulatory Visit (INDEPENDENT_AMBULATORY_CARE_PROVIDER_SITE_OTHER): Payer: Medicare Other | Admitting: Internal Medicine

## 2014-12-25 VITALS — BP 140/80 | HR 99 | Temp 98.6°F | Wt 166.0 lb

## 2014-12-25 DIAGNOSIS — M353 Polymyalgia rheumatica: Secondary | ICD-10-CM

## 2014-12-25 DIAGNOSIS — M25511 Pain in right shoulder: Secondary | ICD-10-CM

## 2014-12-25 DIAGNOSIS — R700 Elevated erythrocyte sedimentation rate: Secondary | ICD-10-CM

## 2014-12-25 DIAGNOSIS — Z0000 Encounter for general adult medical examination without abnormal findings: Secondary | ICD-10-CM

## 2014-12-25 DIAGNOSIS — G458 Other transient cerebral ischemic attacks and related syndromes: Secondary | ICD-10-CM

## 2014-12-25 MED ORDER — PREDNISONE 10 MG PO TABS: 20.0000 mg | ORAL_TABLET | Freq: Every day | ORAL | Status: DC

## 2014-12-25 NOTE — Patient Instructions (Signed)

## 2014-12-25 NOTE — Assessment & Plan Note (Signed)
Continue with current prescription therapy as reflected on the Med list.  

## 2014-12-25 NOTE — Progress Notes (Signed)
Pre visit review using our clinic review tool, if applicable. No additional management support is needed unless otherwise documented below in the visit note. 

## 2014-12-25 NOTE — Assessment & Plan Note (Signed)
Will increase Prednisone to 20 mg/d

## 2014-12-25 NOTE — Assessment & Plan Note (Signed)
Not better 

## 2014-12-25 NOTE — Progress Notes (Signed)
Subjective:    F/u gout. Colchicine helps w/R>L shoulder pain but it is too expensive.[name].[name].His mobility is very limited due to pain F/u LBP 8/10 all the time F/u PMR vs RA - worse on 5 mg Prednisone, better on 10 mg/d, worse again F/u fatigue F/u R>L shoulder pain Colchicine helped neck and shoulder pain   Back Pain Pertinent negatives include no abdominal pain, chest pain or weakness.  Constipation Associated symptoms include back pain. Pertinent negatives include no abdominal pain, diarrhea or nausea.     BP Readings from Last 3 Encounters:  12/25/14 140/80  12/14/14 120/78  10/23/14 120/70   Wt Readings from Last 3 Encounters:  12/25/14 166 lb (75.297 kg)  12/14/14 167 lb (75.751 kg)  10/23/14 169 lb (76.658 kg)     Review of Systems  Constitutional: Negative for appetite change, fatigue and unexpected weight change.  HENT: Negative for congestion, nosebleeds, sneezing, sore throat and trouble swallowing.   Eyes: Negative for itching and visual disturbance.  Respiratory: Negative for cough.   Cardiovascular: Negative for chest pain, palpitations and leg swelling.  Gastrointestinal: Positive for constipation. Negative for nausea, abdominal pain, diarrhea, blood in stool and abdominal distention.  Genitourinary: Negative for frequency and hematuria.  Musculoskeletal: Positive for myalgias, back pain, arthralgias and gait problem. Negative for joint swelling and neck pain.  Skin: Negative for rash.  Neurological: Negative for dizziness, tremors, speech difficulty and weakness.  Psychiatric/Behavioral: Negative for suicidal ideas, confusion, sleep disturbance, dysphoric mood and agitation. The patient is not nervous/anxious.        Objective:   Physical Exam  Constitutional: He is oriented to person, place, and time. He appears well-developed. No distress.  NAD  HENT:  Mouth/Throat: Oropharynx is clear and moist.  Eyes: Conjunctivae are normal. Pupils are equal,  round, and reactive to light.  Neck: Normal range of motion. No JVD present. No thyromegaly present.  Cardiovascular: Normal rate, regular rhythm, normal heart sounds and intact distal pulses.  Exam reveals no gallop and no friction rub.   No murmur heard. Pulmonary/Chest: Effort normal and breath sounds normal. No respiratory distress. He has no wheezes. He has no rales. He exhibits no tenderness.  Abdominal: Soft. Bowel sounds are normal. He exhibits no distension and no mass. There is no tenderness. There is no rebound and no guarding.  Musculoskeletal: Normal range of motion. He exhibits no edema or tenderness.  Lymphadenopathy:    He has no cervical adenopathy.  Neurological: He is alert and oriented to person, place, and time. He has normal reflexes. No cranial nerve deficit. He exhibits normal muscle tone. He displays a negative Romberg sign. Coordination and gait normal.  No meningeal signs  Skin: Skin is warm and dry. No rash noted.  Psychiatric: He has a normal mood and affect. His behavior is normal. Judgment and thought content normal.  R shoulder is less tender LS is tender Index DIP is deformed R  Lab Results  Component Value Date   WBC 11.8* 09/28/2014   HGB 13.1 09/28/2014   HCT 39.4 09/28/2014   PLT 204.0 09/28/2014   GLUCOSE 131* 09/28/2014   CHOL 115 06/04/2014   TRIG 110 06/04/2014   HDL 31* 06/04/2014   LDLCALC 62 06/04/2014   ALT 18 09/28/2014   AST 22 09/28/2014   NA 139 09/28/2014   K 3.8 09/28/2014   CL 102 09/28/2014   CREATININE 1.5 09/28/2014   BUN 35* 09/28/2014   CO2 31 09/28/2014   TSH 3.91  09/28/2014   PSA 0.23 12/27/2013   INR 1.49 11/21/2009   HGBA1C 5.9* 06/03/2014       Assessment & Plan:

## 2014-12-25 NOTE — Assessment & Plan Note (Signed)

## 2014-12-25 NOTE — Assessment & Plan Note (Signed)
Appt w[facility] is pending ??RA Continue with current prescription therapy as reflected on the Med list.

## 2015-01-24 ENCOUNTER — Ambulatory Visit: Payer: Medicare Other | Admitting: Internal Medicine

## 2015-02-19 ENCOUNTER — Telehealth: Payer: Self-pay | Admitting: Internal Medicine

## 2015-02-19 NOTE — Telephone Encounter (Signed)
Would like script for hydromorphone written to pick up on Wed.

## 2015-02-20 NOTE — Telephone Encounter (Signed)
OK to fill this prescription with additional refills x0 Thank you!  

## 2015-02-21 MED ORDER — HYDROMORPHONE HCL 2 MG PO TABS: 2.0000 mg | ORAL_TABLET | Freq: Three times a day (TID) | ORAL | Status: DC | PRN

## 2015-02-21 NOTE — Telephone Encounter (Signed)
Rx given to pt. 

## 2015-03-12 ENCOUNTER — Encounter: Payer: Self-pay | Admitting: Family Medicine

## 2015-03-12 ENCOUNTER — Ambulatory Visit (INDEPENDENT_AMBULATORY_CARE_PROVIDER_SITE_OTHER): Payer: Medicare Other | Admitting: Family Medicine

## 2015-03-12 DIAGNOSIS — M25511 Pain in right shoulder: Secondary | ICD-10-CM

## 2015-03-12 NOTE — Progress Notes (Signed)
Pre visit review using our clinic review tool, if applicable. No additional management support is needed unless otherwise documented below in the visit note. 

## 2015-03-12 NOTE — Progress Notes (Signed)
CC: Right shoulder pain, left  HPI: Patient is a very pleasant 81 year old gentleman coming in with right shoulder pain followup. Patient was seen one year ago and was diagnosed with arthritis of the right shoulder. Patient was given a steroid injection, home exercises and icing protocol. Patient states    Past medical, surgical, family and social history reviewed. Medications reviewed all in the electronic medical record. Patient's past medical history is significant for polymyalgia rheumatica   Review of Systems: No headache, visual changes, nausea, vomiting, diarrhea, constipation, dizziness, abdominal pain, skin rash, fevers, chills, night sweats, weight loss, swollen lymph nodes, body aches, joint swelling, muscle aches, chest pain, shortness of breath, mood changes.   Objective:    There were no vitals taken for this visit.   General: No apparent distress alert and oriented x3 mood and affect normal, dressed appropriately.  HEENT: Pupils equal, extraocular movements intact Respiratory: Patient's speak in full sentences and does not appear short of breath Cardiovascular: No lower extremity edema, non tender, no erythema Skin: Warm dry intact with no signs of infection or rash on extremities or on axial skeleton. Abdomen: Soft nontender Neuro: Cranial nerves II through XII are intact, neurovascularly intact in all extremities with 2+ DTRs and 2+ pulses. Lymph: No lymphadenopathy of posterior or anterior cervical chain or axillae bilaterally.  Gait normal with good balance and coordination.  MSK: Non tender with full range of motion and good stability and symmetric strength and tone of elbows, wrist, hip, knee and ankles bilaterally. Mild crepitus in osteoarthritic changes of multiple joints. Shoulder: Right  inspection showed the patient has atrophy of the shoulder musculature bilaterally. Palpation shows the patient is tender to palpation over the posterior lateral aspect of the  shoulder Patient does have full passive range of motion Rotator cuff strength normal throughout. Positive signs of impingement with positive Neer and Hawkin's tests, empty can sign. Speeds and Yergason's tests normal. No labral pathology noted with negative Obrien's, negative clunk and good stability. Normal scapular function observed. Mild painful arc and no drop arm sign. No apprehension sign  MSK US performed of: Right This study was ordered, performed, and interpreted by Terrilee FilesZach Kyce Ging D.O.  Shoulder:   Supraspinatus:  Appears normal on long and transverse views, no bursal bulge seen with shoulder abduction on impingement view. Infraspinatus:  Appears normal on long and transverse views. Subscapularis:  Appears normal on long and transverse views. Teres Minor:  Appears normal on long and transverse views. 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 Biceps Tendon:  Appears normal on long and transverse views, no fraying of tendon, tendon located in intertubercular groove, no subluxation with shoulder internal or external rotation. No increased power doppler signal.   Impression: Osteoarthritis of the right shoulder moderate  Procedure: Real-time Ultrasound Guided Injection of right glenohumeral joint Device: GE Logiq E  Ultrasound guided injection is preferred based studies that show increased duration, increased effect, greater accuracy, decreased procedural pain, increased response rate with ultrasound guided versus blind injection.  Verbal informed consent obtained.  Time-out conducted.  Noted no overlying erythema, induration, or other signs of local infection.  Skin prepped in a sterile fashion.  Local anesthesia: Topical Ethyl chloride.  With sterile technique and under real time ultrasound guidance:  Joint visualized.  23g  1  inch needle inserted posterior approach. Pictures taken for needle placement. Patient did have injection  of 2 cc of 1% lidocaine, 2 cc of 0.5% Marcaine, and 1.0 cc of Kenalog 40 mg/dL. Completed without difficulty  Pain immediately resolved suggesting accurate placement of the medication.  Advised to call if fevers/chills, erythema, induration, drainage, or persistent bleeding.  Images permanently stored and available for review in the ultrasound unit.  Impression: Technically successful ultrasound guided injection.    Impression and Recommendations:     This case required medical decision making of moderate complexity. Spent greater than 25 minutes with patient face-to-face and had greater than 50% of counseling including as described above in assessment and plan.

## 2015-03-13 NOTE — Assessment & Plan Note (Signed)
No service

## 2015-03-15 ENCOUNTER — Ambulatory Visit (INDEPENDENT_AMBULATORY_CARE_PROVIDER_SITE_OTHER): Payer: Medicare Other | Admitting: Internal Medicine

## 2015-03-15 ENCOUNTER — Encounter: Payer: Self-pay | Admitting: Internal Medicine

## 2015-03-15 VITALS — BP 130/72 | HR 86 | Temp 98.6°F

## 2015-03-15 DIAGNOSIS — M5440 Lumbago with sciatica, unspecified side: Secondary | ICD-10-CM | POA: Diagnosis not present

## 2015-03-15 DIAGNOSIS — R413 Other amnesia: Secondary | ICD-10-CM

## 2015-03-15 DIAGNOSIS — M25511 Pain in right shoulder: Secondary | ICD-10-CM | POA: Diagnosis not present

## 2015-03-15 MED ORDER — HYDROMORPHONE HCL 4 MG PO TABS: 2.0000 mg | ORAL_TABLET | Freq: Three times a day (TID) | ORAL | Status: DC | PRN

## 2015-03-15 NOTE — Progress Notes (Signed)
Pre visit review using our clinic review tool, if applicable. No additional management support is needed unless otherwise documented below in the visit note. 

## 2015-03-16 MED ORDER — METHYLPREDNISOLONE ACETATE 80 MG/ML IJ SUSP
80.0000 mg | Freq: Once | INTRAMUSCULAR | Status: AC
  Administered 2015-03-19: 80 mg via INTRA_ARTICULAR

## 2015-03-16 NOTE — Assessment & Plan Note (Signed)
See Procedures.

## 2015-03-16 NOTE — Assessment & Plan Note (Signed)
Dilaudid dose was increased  Potential benefits of a long term opioids use as well as potential risks (i.e. addiction risk, apnea etc) and complications (i.e. Somnolence, constipation and others) were explained to the patient and were aknowledged.

## 2015-03-16 NOTE — Patient Instructions (Signed)
Postprocedure instructions :    A Band-Aid should be left on for 12 hours. Injection therapy is not a cure itself. It is used in conjunction with other modalities. You can use nonsteroidal anti-inflammatories like ibuprofen , hot and cold compresses. Rest is recommended in the next 24 hours. You need to report immediately  if fever, chills or any signs of infection develop. 

## 2015-03-16 NOTE — Progress Notes (Signed)
Subjective:   C/o bad R shoulder pain - worse F/u gout. Colchicine helps w/R>L shoulder pain but it is too expensive.[name].[name].His mobility is very limited due to pain F/u LBP 8/10 all the time F/u PMR - worse on 5 mg Prednisone, better on 10 mg/d. OA pains are worse - 10/10 can't bear it now... F/u fatigue F/u R>L shoulder pain Colchicine helped neck and shoulder pain   Back Pain This is a chronic problem. The current episode started more than 1 year ago. The problem has been gradually worsening since onset. The pain is present in the lumbar spine and thoracic spine. The pain is at a severity of 8/10. The pain is severe. The symptoms are aggravated by sitting, bending, standing and twisting. Associated symptoms include leg pain. Pertinent negatives include no bladder incontinence, chest pain, paresthesias or weakness.  Pt is asking to increase Dilaudid C/o R shoulder pain relapsed   BP Readings from Last 3 Encounters:  03/15/15 130/72  12/25/14 140/80  12/14/14 120/78   Wt Readings from Last 3 Encounters:  12/25/14 166 lb (75.297 kg)  12/14/14 167 lb (75.751 kg)  10/23/14 169 lb (76.658 kg)     Review of Systems  Constitutional: Negative for appetite change, fatigue and unexpected weight change.  HENT: Negative for congestion, nosebleeds, sneezing, sore throat and trouble swallowing.   Eyes: Negative for itching and visual disturbance.  Respiratory: Negative for cough.   Cardiovascular: Negative for chest pain, palpitations and leg swelling.  Gastrointestinal: Negative for blood in stool and abdominal distention.  Genitourinary: Negative for bladder incontinence, frequency and hematuria.  Musculoskeletal: Positive for myalgias, back pain, arthralgias and gait problem. Negative for joint swelling and neck pain.  Skin: Negative for rash.  Neurological: Negative for dizziness, tremors, speech difficulty, weakness and paresthesias.  Psychiatric/Behavioral: Negative for suicidal ideas,  confusion, sleep disturbance, dysphoric mood and agitation. The patient is not nervous/anxious.        Objective:   Physical Exam  Constitutional: He is oriented to person, place, and time. He appears well-developed. No distress.  NAD  HENT:  Mouth/Throat: Oropharynx is clear and moist.  Eyes: Conjunctivae are normal. Pupils are equal, round, and reactive to light.  Neck: Normal range of motion. No JVD present. No thyromegaly present.  Cardiovascular: Normal rate, regular rhythm, normal heart sounds and intact distal pulses.  Exam reveals no gallop and no friction rub.   No murmur heard. Pulmonary/Chest: Effort normal and breath sounds normal. No respiratory distress. He has no wheezes. He has no rales. He exhibits no tenderness.  Abdominal: Soft. Bowel sounds are normal. He exhibits no distension and no mass. There is no tenderness. There is no rebound and no guarding.  Musculoskeletal: Normal range of motion. He exhibits no edema or tenderness.  Lymphadenopathy:    He has no cervical adenopathy.  Neurological: He is alert and oriented to person, place, and time. He has normal reflexes. No cranial nerve deficit. He exhibits normal muscle tone. He displays a negative Romberg sign. Coordination and gait normal.  No meningeal signs  Skin: Skin is warm and dry. No rash noted.  Psychiatric: He has a normal mood and affect. His behavior is normal. Judgment and thought content normal.  R shoulder is tender; R deltoid is tender LS tender  Lab Results  Component Value Date   WBC 11.8* 09/28/2014   HGB 13.1 09/28/2014   HCT 39.4 09/28/2014   PLT 204.0 09/28/2014   GLUCOSE 131* 09/28/2014   CHOL 115  06/04/2014   TRIG 110 06/04/2014   HDL 31* 06/04/2014   LDLCALC 62 06/04/2014   ALT 18 09/28/2014   AST 22 09/28/2014   NA 139 09/28/2014   K 3.8 09/28/2014   CL 102 09/28/2014   CREATININE 1.5 09/28/2014   BUN 35* 09/28/2014   CO2 31 09/28/2014   TSH 3.91 09/28/2014   PSA 0.23  12/27/2013   INR 1.49 11/21/2009   HGBA1C 5.9* 06/03/2014    Procedure :Joint Injection,  right shoulder   Subacromial bursitis with refractory  chronic pain.   Risks including unsuccessful procedure , bleeding, infection, bruising, skin atrophy, "steroid flare-up" and others were explained to the patient in detail as well as the benefits. Informed consent was obtained and signed.   Tthe patient was placed in a comfortable position. Lateral approach was used. Skin was prepped with Betadine and alcohol  and anesthetized with a cooling spray. Then, a 5 cc syringe with a 2 inch long 24-gauge needle was used for a joint injection.. The needle was advanced  Into the subacromial space.The bursa was injected with 3 mL of 2% lidocaine and 60 mg of Depo-Medrol .  Band-Aid was applied.   Tolerated well. Complications: None. Good pain relief following the procedure.   Postprocedure instructions :    A Band-Aid should be left on for 12 hours. Injection therapy is not a cure itself. It is used in conjunction with other modalities. You can use nonsteroidal anti-inflammatories like ibuprofen , hot and cold compresses. Rest is recommended in the next 24 hours. You need to report immediately  if fever, chills or any signs of infection develop.   Procedure Note :    Trigger Point Injection:   Indication : Focal tender area identifiable by the location without other identifiable neurologic or musculoskeletal finding or pathology.   Risks including unsuccessful procedure , bleeding, infection, bruising, skin atrophy and others were explained to the patient in detail as well as the benefits. Informed consent was obtained and signed.   Tthe patient was placed in a comfortable position.  2  points of maximum tenderness over posterior R deltoid muscle were marked and  the skin was prepped with Betadine and alcohol. 1 inch 25-gauge needle was used. The needle was advanced perpendicular to the skin. Each trigger  point was injected with 1 mL of 2% lidocaine and 10 mg of Depo-Medrol in a usual fashion.  Band-Aids applied.   Tolerated well. Complications: None. Good pain relief following the procedure.     Assessment & Plan:

## 2015-04-17 ENCOUNTER — Telehealth: Payer: Self-pay | Admitting: Internal Medicine

## 2015-04-17 MED ORDER — HYDROMORPHONE HCL 4 MG PO TABS: 2.0000 mg | ORAL_TABLET | Freq: Three times a day (TID) | ORAL | Status: DC | PRN

## 2015-04-17 NOTE — Telephone Encounter (Signed)
Please advise, thanks.

## 2015-04-17 NOTE — Telephone Encounter (Signed)
Ok to Rf per MD. Rx printed/signed/upfront for p/u. Pt informed to p/u on 04/18/15.

## 2015-04-17 NOTE — Telephone Encounter (Signed)
Pt came by office requesting a refill on his Dilaudid.

## 2015-05-18 ENCOUNTER — Telehealth: Payer: Self-pay | Admitting: Internal Medicine

## 2015-05-18 MED ORDER — HYDROMORPHONE HCL 4 MG PO TABS: 2.0000 mg | ORAL_TABLET | Freq: Three times a day (TID) | ORAL | Status: DC | PRN

## 2015-05-18 NOTE — Telephone Encounter (Signed)
Pt called in and needs refill on his HYDROmorphone (DILAUDID) 4 MG tablet [[id number]][[id number]]

## 2015-05-18 NOTE — Telephone Encounter (Signed)
Ok Thx 

## 2015-05-21 NOTE — Telephone Encounter (Signed)
Signed Rx upfront for p/u. Left detailed mess informing pt.

## 2015-06-18 ENCOUNTER — Telehealth: Payer: Self-pay | Admitting: Internal Medicine

## 2015-06-18 MED ORDER — HYDROMORPHONE HCL 4 MG PO TABS: 2.0000 mg | ORAL_TABLET | Freq: Three times a day (TID) | ORAL | Status: DC | PRN

## 2015-06-18 NOTE — Telephone Encounter (Signed)
Pt called in and would like his refill on his HYDROmorphone (DILAUDID) 4 MG tablet [[id number]]   Please call pt when it is ready for pick up

## 2015-06-18 NOTE — Telephone Encounter (Signed)
Rx printed/upfront for p/u.. Pt informed  

## 2015-06-18 NOTE — Telephone Encounter (Signed)
OK to fill this prescription with additional refills x0 Thank you!  

## 2015-07-17 ENCOUNTER — Telehealth: Payer: Self-pay | Admitting: Internal Medicine

## 2015-07-17 NOTE — Telephone Encounter (Signed)
Pt called in and is needing refill on his HYDROmorphone (DILAUDID) 4 MG tablet [[id number]][[id number]]

## 2015-07-18 NOTE — Telephone Encounter (Signed)
Ok Thx 

## 2015-07-26 ENCOUNTER — Other Ambulatory Visit: Payer: Self-pay | Admitting: *Deleted

## 2015-07-26 MED ORDER — HYDROMORPHONE HCL 4 MG PO TABS: 2.0000 mg | ORAL_TABLET | Freq: Three times a day (TID) | ORAL | Status: DC | PRN

## 2015-07-26 NOTE — Telephone Encounter (Signed)
I was out of the office on 07/18/15 when this was routed back to me. It was not in my Inbasket to be completed upon my return. Pt is here today requesting script. MD is out of office this aftn. Rx printed/holding for MD sig. Pt informed by Victorino Dike, front desk to p/u rx on 07/27/15.

## 2015-08-24 ENCOUNTER — Telehealth: Payer: Self-pay | Admitting: *Deleted

## 2015-08-24 NOTE — Telephone Encounter (Signed)
Pt is requesting refill on is hydromorphone. pls advise...Adam Weeks

## 2015-08-24 NOTE — Telephone Encounter (Signed)
OK to fill this prescription with additional refills x0 OV q 3 mo Thank you!  

## 2015-08-25 MED ORDER — HYDROMORPHONE HCL 4 MG PO TABS: 2.0000 mg | ORAL_TABLET | Freq: Three times a day (TID) | ORAL | Status: DC | PRN

## 2015-08-25 NOTE — Telephone Encounter (Signed)
Pt called during [facility] and asked if rx was ready for pick up.  No med printed for MD to sign.   This is printed for PCP now.

## 2015-08-27 NOTE — Telephone Encounter (Signed)
Called pt no answer LMOM rx ready for pick-up. Place in cabinet...Raechel Chute

## 2015-09-21 ENCOUNTER — Telehealth: Payer: Self-pay | Admitting: Internal Medicine

## 2015-09-21 NOTE — Telephone Encounter (Signed)
Patient has an appt on Wednesday. Requesting a refill of HYDROmorphone (DILAUDID) 4 MG tablet [[id number]]

## 2015-09-22 NOTE — Telephone Encounter (Signed)
Ok OV q 3 mo Thx 

## 2015-09-24 MED ORDER — HYDROMORPHONE HCL 4 MG PO TABS: 2.0000 mg | ORAL_TABLET | Freq: Three times a day (TID) | ORAL | Status: DC | PRN

## 2015-09-24 NOTE — Telephone Encounter (Signed)
Notified pt rx ready for pick-up.../lmb 

## 2015-09-26 ENCOUNTER — Ambulatory Visit: Payer: Medicare Other | Admitting: Internal Medicine

## 2015-11-01 ENCOUNTER — Encounter: Payer: Self-pay | Admitting: Internal Medicine

## 2015-11-01 ENCOUNTER — Ambulatory Visit (INDEPENDENT_AMBULATORY_CARE_PROVIDER_SITE_OTHER): Payer: Medicare Other | Admitting: Internal Medicine

## 2015-11-01 VITALS — BP 112/78 | HR 83

## 2015-11-01 DIAGNOSIS — M5440 Lumbago with sciatica, unspecified side: Secondary | ICD-10-CM

## 2015-11-01 DIAGNOSIS — M353 Polymyalgia rheumatica: Secondary | ICD-10-CM

## 2015-11-01 DIAGNOSIS — Z23 Encounter for immunization: Secondary | ICD-10-CM

## 2015-11-01 MED ORDER — HYDROMORPHONE HCL 4 MG PO TABS: 2.0000 mg | ORAL_TABLET | Freq: Four times a day (QID) | ORAL | Status: DC | PRN

## 2015-11-01 MED ORDER — PREDNISONE 1 MG PO TABS
ORAL_TABLET | ORAL | Status: DC
Start: 2015-11-01 — End: 2016-01-22

## 2015-11-01 MED ORDER — PREDNISONE 5 MG PO TABS: ORAL_TABLET | ORAL | Status: DC

## 2015-11-01 NOTE — Progress Notes (Signed)
Pre visit review using our clinic review tool, if applicable. No additional management support is needed unless otherwise documented below in the visit note. 

## 2015-11-01 NOTE — Assessment & Plan Note (Signed)
Take total of 9 mg Prednisone a day x 1 month, then 8 mg/d x 1 month, then 7 mg a day x 1 month and so on. Use pc Increase Dilaudid - qid   Potential benefits of a long term opioids use as well as potential risks (i.e. addiction risk, apnea etc) and complications (i.e. Somnolence, constipation and others) were explained to the patient and were aknowledged.

## 2015-11-01 NOTE — Progress Notes (Signed)
Subjective:  Patient ID: Adam Weeks, male    DOB: 12-17-1927  Age: 81 y.o. MRN: [id number]  CC: No chief complaint on file.   HPI Adam BoozeLeo A Goonan Weeks presents for LBP, gout, depression, PMR f/u  Outpatient Prescriptions Prior to Visit  Medication Sig Dispense Refill  . acetaminophen (TYLENOL) 500 MG tablet Take 1,000 mg by mouth every 6 (six) hours as needed for mild pain.    [name]. allopurinol (ZYLOPRIM) 100 MG tablet Take 1 tablet (100 mg total) by mouth daily. 30 tablet 0  . aspirin EC 325 MG tablet Take 1 tablet (325 mg total) by mouth daily. 90 tablet 0  . atorvastatin (LIPITOR) 40 MG tablet Take 1 tablet (40 mg total) by mouth daily. 30 tablet 0  . carvedilol (COREG) 12.5 MG tablet Take 12.5 mg by mouth 2 (two) times daily with a meal.    . Cholecalciferol 1000 UNITS tablet Take 1,000 Units by mouth daily.      . colchicine (COLCRYS) 0.6 MG tablet Take 1 tablet (0.6 mg total) by mouth daily. 100 tablet 3  . docusate sodium (COLACE) 100 MG capsule Take 200 mg by mouth 2 (two) times daily.    . finasteride (PROSCAR) 5 MG tablet Take 5 mg by mouth daily.    . furosemide (LASIX) 40 MG tablet Take 40 mg by mouth daily.    [name]. lidocaine (LIDODERM) 5 % Place 1 patch onto the skin daily. Remove & Discard patch within 12 hours or as directed by MD    . Magnesium 200 MG TABS 1 po qd (Patient taking differently: 1 each by Per post-pyloric tube route daily. ) 100 each 3  . Multiple Vitamins-Minerals (OCUVITE PRESERVISION) TABS Take 2 tablets by mouth 2 (two) times daily.      [name]. omeprazole (PRILOSEC) 20 MG capsule Take 20 mg by mouth daily.      . Venlafaxine HCl 225 MG TB24 Take 225 mg by mouth daily.    [name]. HYDROmorphone (DILAUDID) 4 MG tablet Take 0.5-1 tablets (2-4 mg total) by mouth 3 (three) times daily as needed for severe pain. 90 tablet 0  . predniSONE (DELTASONE) 10 MG tablet Take 2 tablets (20 mg total) by mouth daily with breakfast. (Patient taking differently: Take 10 mg by mouth daily with  breakfast. ) 100 tablet 3   Facility-Administered Medications Prior to Visit  Medication Dose Route Frequency Provider Last Rate Last Dose  . methylPREDNISolone acetate (DEPO-MEDROL) injection 40 mg  40 mg Intra-articular Once Aleksei Plotnikov V, MD      . methylPREDNISolone acetate (DEPO-MEDROL) injection 80 mg  80 mg Intra-articular Once Aleksei Plotnikov V, MD        ROS Review of Systems  Constitutional: Positive for fatigue. Negative for appetite change and unexpected weight change.  HENT: Negative for congestion, nosebleeds, sneezing, sore throat and trouble swallowing.   Eyes: Negative for itching and visual disturbance.  Respiratory: Negative for cough.   Cardiovascular: Negative for chest pain, palpitations and leg swelling.  Gastrointestinal: Negative for nausea, diarrhea, blood in stool and abdominal distention.  Genitourinary: Negative for frequency and hematuria.  Musculoskeletal: Positive for back pain, arthralgias, gait problem and neck stiffness. Negative for joint swelling and neck pain.  Skin: Negative for pallor and rash.  Neurological: Negative for dizziness, tremors, speech difficulty and weakness.  Psychiatric/Behavioral: Positive for decreased concentration. Negative for sleep disturbance, dysphoric mood and agitation. The patient is nervous/anxious.     Objective:  BP 112/78 mmHg  Pulse 83  SpO2 95%  BP Readings from Last 3 Encounters:  11/01/15 112/78  03/15/15 130/72  12/25/14 140/80    Wt Readings from Last 3 Encounters:  12/25/14 166 lb (75.297 kg)  12/14/14 167 lb (75.751 kg)  10/23/14 169 lb (76.658 kg)    Physical Exam  Constitutional: He is oriented to person, place, and time. He appears well-developed. No distress.  NAD  HENT:  Mouth/Throat: Oropharynx is clear and moist.  Eyes: Conjunctivae are normal. Pupils are equal, round, and reactive to light.  Neck: Normal range of motion. No JVD present. No thyromegaly present.  Cardiovascular:  Normal rate, regular rhythm, normal heart sounds and intact distal pulses.  Exam reveals no gallop and no friction rub.   No murmur heard. Pulmonary/Chest: Effort normal and breath sounds normal. No respiratory distress. He has no wheezes. He has no rales. He exhibits no tenderness.  Abdominal: Soft. Bowel sounds are normal. He exhibits no distension and no mass. There is no tenderness. There is no rebound and no guarding.  Musculoskeletal: Normal range of motion. He exhibits tenderness. He exhibits no edema.  Lymphadenopathy:    He has no cervical adenopathy.  Neurological: He is alert and oriented to person, place, and time. He has normal reflexes. No cranial nerve deficit. He exhibits normal muscle tone. He displays a negative Romberg sign. Coordination abnormal. Gait normal.  Skin: Skin is warm and dry. No rash noted.  Psychiatric: He has a normal mood and affect. His behavior is normal. Judgment and thought content normal.   LS is tender Lab Results  Component Value Date   WBC 11.8* 09/28/2014   HGB 13.1 09/28/2014   HCT 39.4 09/28/2014   PLT 204.0 09/28/2014   GLUCOSE 131* 09/28/2014   CHOL 115 06/04/2014   TRIG 110 06/04/2014   HDL 31* 06/04/2014   LDLCALC 62 06/04/2014   ALT 18 09/28/2014   AST 22 09/28/2014   NA 139 09/28/2014   K 3.8 09/28/2014   CL 102 09/28/2014   CREATININE 1.5 09/28/2014   BUN 35* 09/28/2014   CO2 31 09/28/2014   TSH 3.91 09/28/2014   PSA 0.23 12/27/2013   INR 1.49 11/21/2009   HGBA1C 5.9* 06/03/2014    Dg Chest 2 View  06/03/2014  CLINICAL DATA:  Chest pain, weakness, body aches EXAM: CHEST  2 VIEW COMPARISON:  Prior chest x-ray 02/03/2014 FINDINGS: Very low inspiratory volumes with bibasilar subsegmental atelectasis. Cardiac and mediastinal contours are unchanged. There is mild cardiomegaly and atherosclerosis of the tortuous thoracic aorta. 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 The mid and upper lungs are clear  without evidence of a pneumothorax or pleural effusion. Background bronchitic changes and interstitial prominence are similar compared to prior. No acute osseous abnormality. Incompletely imaged thoracolumbar compression fracture with changes of prior vertebroplasty. IMPRESSION: Very low inspiratory volumes with bibasilar subsegmental atelectasis. Otherwise, stable chest x-ray without evidence of acute cardiopulmonary disease. Electronically Signed   By: Malachy Moan M.D.   On: 06/03/2014 08:49   Ct Head Wo Contrast  06/03/2014  CLINICAL DATA:  Headache, dysphagia, weakness EXAM: CT HEAD WITHOUT CONTRAST TECHNIQUE: Contiguous axial images were obtained from the base of the skull through the vertex without intravenous contrast. COMPARISON:  None. FINDINGS: No evidence of parenchymal hemorrhage or extra-axial fluid collection. No mass lesion, mass effect, or midline shift. No CT evidence of acute infarction. Subcortical white matter and periventricular small vessel ischemic changes. Age related atrophy.  No  ventriculomegaly. The visualized paranasal sinuses are essentially clear. The mastoid air cells are unopacified. No evidence of calvarial fracture. IMPRESSION: No evidence of acute intracranial abnormality. Atrophy with small vessel ischemic changes. Electronically Signed   By: Charline Bills M.D.   On: 06/03/2014 08:29   Mri Brain Without Contrast  06/04/2014  CLINICAL DATA:  Altered mental status. Weakness and upper extremity pain. Slurred speech. Symptoms resolved at the time of evaluation. EXAM: MRI HEAD WITHOUT CONTRAST MRA HEAD WITHOUT CONTRAST TECHNIQUE: Multiplanar, multiecho pulse sequences of the brain and surrounding structures were obtained without intravenous contrast. Angiographic images of the head were obtained using MRA technique without contrast. COMPARISON:  CT head without contrast 06/03/2014. FINDINGS: MRI HEAD FINDINGS The diffusion-weighted images demonstrate no evidence for acute  or subacute infarction. A punctate area of susceptibility in the right cerebellum is compatible with a remote hemorrhage. No acute hemorrhage or mass lesion is present. Mild atrophy and minimal white matter disease is within normal limits for age. Flow is present in the major intracranial arteries. The patient is status post bilateral lens extractions. The globes and orbits are otherwise intact. The paranasal sinuses and mastoid air cells are clear. MRA HEAD FINDINGS The internal carotid arteries are within normal limits from the high cervical segments through the ICA termini bilaterally. The A1 and M1 segments are normal. The study is mildly degraded by patient motion. The MCA bifurcations are intact. Mild segmental irregularity is noted in the distal MCA branch vessels. The left vertebral artery is slightly dominant to the right. The PICA origins are visualized and normal. The basilar artery is within normal limits. The right posterior cerebral artery originates from the basilar tip. The left posterior cerebral artery is of fetal type. IMPRESSION: 1. No acute or focal abnormality to explain the patient's symptoms. 2. Punctate area of remote hemorrhage in the right cerebellum is nonspecific. No discrete lesion is evident on other sequences. 3. Otherwise normal MRI of the brain for age. 4. Minimal distal small vessel disease is evident on the MRA. No significant proximal stenosis, aneurysm, or branch vessel occlusion is present. Electronically Signed   By: Gennette Pac M.D.   On: 06/04/2014 13:36   Mr Maxine Glenn Head/brain Wo Cm  06/04/2014  CLINICAL DATA:  Altered mental status. Weakness and upper extremity pain. Slurred speech. Symptoms resolved at the time of evaluation. EXAM: MRI HEAD WITHOUT CONTRAST MRA HEAD WITHOUT CONTRAST TECHNIQUE: Multiplanar, multiecho pulse sequences of the brain and surrounding structures were obtained without intravenous contrast. Angiographic images of the head were obtained using MRA  technique without contrast. COMPARISON:  CT head without contrast 06/03/2014. FINDINGS: MRI HEAD FINDINGS The diffusion-weighted images demonstrate no evidence for acute or subacute infarction. A punctate area of susceptibility in the right cerebellum is compatible with a remote hemorrhage. No acute hemorrhage or mass lesion is present. Mild atrophy and minimal white matter disease is within normal limits for age. Flow is present in the major intracranial arteries. The patient is status post bilateral lens extractions. The globes and orbits are otherwise intact. The paranasal sinuses and mastoid air cells are clear. MRA HEAD FINDINGS The internal carotid arteries are within normal limits from the high cervical segments through the ICA termini bilaterally. The A1 and M1 segments are normal. The study is mildly degraded by patient motion. The MCA bifurcations are intact. Mild segmental irregularity is noted in the distal MCA branch vessels. The left vertebral artery is slightly dominant to the right. The PICA origins are visualized  and normal. The basilar artery is within normal limits. The right posterior cerebral artery originates from the basilar tip. The left posterior cerebral artery is of fetal type. IMPRESSION: 1. No acute or focal abnormality to explain the patient's symptoms. 2. Punctate area of remote hemorrhage in the right cerebellum is nonspecific. No discrete lesion is evident on other sequences. 3. Otherwise normal MRI of the brain for age. 4. Minimal distal small vessel disease is evident on the MRA. No significant proximal stenosis, aneurysm, or branch vessel occlusion is present. Electronically Signed   By: Gennette Pac M.D.   On: 06/04/2014 13:36    Assessment & Plan:   Diagnoses and all orders for this visit:  PMR (polymyalgia rheumatica) (HCC)  Midline low back pain with sciatica, sciatica laterality unspecified  Other orders -     Discontinue: HYDROmorphone (DILAUDID) 4 MG tablet;  Take 0.5-1 tablets (2-4 mg total) by mouth every 6 (six) hours as needed for severe pain. -     predniSONE (DELTASONE) 5 MG tablet; Take total of 9 mg a day x 1 month, then 8 mg/d x 1 month, then 7 mg a day x 1 month and so on. Use pc -     predniSONE (DELTASONE) 1 MG tablet; Take total of 9 mg a day x 1 month, then 8 mg/d x 1 month, then 7 mg a day x 1 month and so on. Use pc -     HYDROmorphone (DILAUDID) 4 MG tablet; Take 0.5-1 tablets (2-4 mg total) by mouth every 6 (six) hours as needed for severe pain. -     Flu vaccine HIGH DOSE PF  I have discontinued Adam Weeks predniSONE. I am also having him start on predniSONE and predniSONE. Additionally, I am having him maintain his Cholecalciferol, OCUVITE PRESERVISION, omeprazole, carvedilol, acetaminophen, finasteride, docusate sodium, lidocaine, Venlafaxine HCl, furosemide, allopurinol, aspirin EC, atorvastatin, Magnesium, colchicine, amLODipine, and HYDROmorphone. We will continue to administer methylPREDNISolone acetate and methylPREDNISolone acetate.  Meds ordered this encounter  Medications  . amLODipine (NORVASC) 5 MG tablet    Sig: Take by mouth daily.  [name] DISCONTD: HYDROmorphone (DILAUDID) 4 MG tablet    Sig: Take 0.5-1 tablets (2-4 mg total) by mouth every 6 (six) hours as needed for severe pain.    Dispense:  120 tablet    Refill:  0  . predniSONE (DELTASONE) 5 MG tablet    Sig: Take total of 9 mg a day x 1 month, then 8 mg/d x 1 month, then 7 mg a day x 1 month and so on. Use pc    Dispense:  30 tablet    Refill:  5  . predniSONE (DELTASONE) 1 MG tablet    Sig: Take total of 9 mg a day x 1 month, then 8 mg/d x 1 month, then 7 mg a day x 1 month and so on. Use pc    Dispense:  120 tablet    Refill:  5  . HYDROmorphone (DILAUDID) 4 MG tablet    Sig: Take 0.5-1 tablets (2-4 mg total) by mouth every 6 (six) hours as needed for severe pain.    Dispense:  120 tablet    Refill:  0    Please fill on or after 12/01/15     Follow-up:  Return in about 2 months (around 01/01/2016) for a follow-up visit.  Sonda Primes, MD

## 2015-11-01 NOTE — Assessment & Plan Note (Signed)
Take total of 9 mg a day x 1 month, then 8 mg/d x 1 month, then 7 mg a day x 1 month and so on. Use pc

## 2015-11-27 ENCOUNTER — Telehealth: Payer: Self-pay | Admitting: *Deleted

## 2015-11-27 NOTE — Telephone Encounter (Signed)
OK to fill this prescription with additional refills x0 Thank you!  

## 2015-11-27 NOTE — Telephone Encounter (Signed)
Left msg on triage wanting to get refill on hydromorphone...Raechel Chute/lmb

## 2015-11-28 MED ORDER — HYDROMORPHONE HCL 4 MG PO TABS: 2.0000 mg | ORAL_TABLET | Freq: Four times a day (QID) | ORAL | Status: DC | PRN

## 2015-11-28 NOTE — Telephone Encounter (Signed)
Notified pt rx ready for pick-up.../lm,b 

## 2015-11-30 ENCOUNTER — Encounter: Payer: Self-pay | Admitting: Internal Medicine

## 2015-11-30 ENCOUNTER — Ambulatory Visit (INDEPENDENT_AMBULATORY_CARE_PROVIDER_SITE_OTHER): Payer: Medicare Other | Admitting: Internal Medicine

## 2015-11-30 ENCOUNTER — Other Ambulatory Visit (INDEPENDENT_AMBULATORY_CARE_PROVIDER_SITE_OTHER): Payer: Medicare Other

## 2015-11-30 VITALS — BP 132/70 | HR 69 | Temp 97.7°F | Resp 16 | Wt 178.0 lb

## 2015-11-30 DIAGNOSIS — R202 Paresthesia of skin: Secondary | ICD-10-CM

## 2015-11-30 DIAGNOSIS — K921 Melena: Secondary | ICD-10-CM

## 2015-11-30 DIAGNOSIS — G255 Other chorea: Secondary | ICD-10-CM | POA: Diagnosis not present

## 2015-11-30 DIAGNOSIS — H9319 Tinnitus, unspecified ear: Secondary | ICD-10-CM

## 2015-11-30 LAB — CBC WITH DIFFERENTIAL/PLATELET
BASOS ABS: 0 10*3/uL (ref 0.0–0.1)
BASOS PCT: 0.3 % (ref 0.0–3.0)
EOS PCT: 2.9 % (ref 0.0–5.0)
Eosinophils Absolute: 0.3 10*3/uL (ref 0.0–0.7)
HEMATOCRIT: 35 % — AB (ref 39.0–52.0)
Hemoglobin: 11.5 g/dL — ABNORMAL LOW (ref 13.0–17.0)
LYMPHS ABS: 1.4 10*3/uL (ref 0.7–4.0)
LYMPHS PCT: 12.5 % (ref 12.0–46.0)
MCHC: 32.7 g/dL (ref 30.0–36.0)
MCV: 98.2 fl (ref 78.0–100.0)
MONOS PCT: 7.3 % (ref 3.0–12.0)
Monocytes Absolute: 0.8 10*3/uL (ref 0.1–1.0)
NEUTROS ABS: 8.5 10*3/uL — AB (ref 1.4–7.7)
Neutrophils Relative %: 77 % (ref 43.0–77.0)
PLATELETS: 186 10*3/uL (ref 150.0–400.0)
RBC: 3.57 Mil/uL — ABNORMAL LOW (ref 4.22–5.81)
RDW: 13.9 % (ref 11.5–15.5)
WBC: 11 10*3/uL — ABNORMAL HIGH (ref 4.0–10.5)

## 2015-11-30 LAB — BASIC METABOLIC PANEL WITH GFR
BUN: 38 mg/dL — ABNORMAL HIGH (ref 6–23)
CO2: 28 meq/L (ref 19–32)
Calcium: 8.7 mg/dL (ref 8.4–10.5)
Chloride: 103 meq/L (ref 96–112)
Creatinine, Ser: 1.57 mg/dL — ABNORMAL HIGH (ref 0.40–1.50)
GFR: 44.71 mL/min — ABNORMAL LOW
Glucose, Bld: 116 mg/dL — ABNORMAL HIGH (ref 70–99)
Potassium: 4.4 meq/L (ref 3.5–5.1)
Sodium: 141 meq/L (ref 135–145)

## 2015-11-30 LAB — CK: Total CK: 104 U/L (ref 7–232)

## 2015-11-30 LAB — TSH: TSH: 3.29 u[IU]/mL (ref 0.35–4.50)

## 2015-11-30 LAB — MAGNESIUM: Magnesium: 1.5 mg/dL (ref 1.5–2.5)

## 2015-11-30 LAB — VITAMIN B12: Vitamin B-12: 697 pg/mL (ref 211–911)

## 2015-11-30 NOTE — Patient Instructions (Signed)
Go to WebMD for information about tinnitus. Avoid excess aspirin and excess noise exposure. "White noise" machine @ bedside can prevent tinnitus from affecting sleep. If symptoms persist or progress; ENT referral will be completed.  Your next office appointment will be determined based upon review of your pending labs  and  xrays  Those written interpretation of the lab results and instructions will be transmitted to you by mail for your records.  Critical results will be called.   Followup as needed for any active or acute issue. Please report any significant change in your symptoms.

## 2015-11-30 NOTE — Progress Notes (Signed)
Subjective:    Patient ID: Adam Weeks, male    DOB: [birth year], 81 y.o.   MRN: [id number]  HPI He has several concerns; he was encouraged to come in by his wife. Last night at approximately 11 PM he noted "solid whistling, running water, or blowing wind sensation in his ears". He went to sleep and this resolved by the time he woke this morning. Last night he stated that he had "jumping" of his upper and lower extremities as well as trunk. He also had some soreness in the left upper extremity down to the fourth left digit. In the past he has had intermittent "buzzing" in the upper extremities down to the hands. He previously was on magnesium but stopped this .  He had a mechanical fall 11/26/15 when he lost his balance and fell backwards while blowing leaves. There was no cardiac or neurologic prodrome prior to the event.  He is some chronic unsteadiness of his gait. He has exertional dyspnea which is chronic related to deconditioning.  He's concerned he might be dehydrated as poor fluid intake is a chronic issue.    Last week he noted some black stool 1. This resolved. He has no other GI symptoms.  He has chronic back pain and has difficulty lying flat or sitting up.  He has had a prosthetic porcine heart valve. He had a TIA approximately 5 years ago manifested as speech issues.    Review of Systems  Denied were any change in heart rhythm or rate prior to the events. There was no associated chest pain or shortness of breath .  Also specifically denied prior to the episodes were headache, limb weakness, tingling, or numbness. No seizure activity noted.  Unexplained weight loss, abdominal pain, significant dyspepsia, dysphagia, melena, rectal bleeding, or persistently small caliber stools are denied.    Objective:   Physical Exam Pertinent or positive findings include:Appears younger than stated age Wax is present in the right canal greater than the left. Tuning fork exam is  normal. Whisper heard @ 6 feet. There is decreased range of motion of the cervical spine. The second heart sound is accentuated. There is suggestion of a grade 1/2 systolic murmur at the right base.[name] His gait is slightly broad. Romberg slightly unsteady; finger-nose testing was normal. He has 2 healing lacerations of the left hand and a bruise of the left shin related to the mechanical fall. He has chronic ecchymotic changes over the dorsum of the hands. He has a flexion contracture of the DIP joint of the index fingers greater on the right than the left. Pedal pulses are decreased.  General appearance :adequately nourished; in no distress.  Eyes: No conjunctival inflammation or scleral icterus is present.  Oral exam:  Lips and gums are healthy appearing.There is no oropharyngeal erythema or exudate noted. Dental hygiene is good.  Heart:  Normal rate and regular rhythm. S1  normal without gallop,  click, rub or other extra sounds    Lungs:Chest clear to auscultation; no wheezes, rhonchi,rales ,or rubs present.No increased work of breathing.   Abdomen: bowel sounds normal, soft and non-tender without masses, organomegaly or hernias noted.  No guarding or rebound.   Vascular : all pulses equal ; no bruits present.  Skin:Warm & dry.  Intact without suspicious lesions or rashes ; no tenting or jaundice   Lymphatic: No lymphadenopathy is noted about the head, neck, axilla.   Neuro: Strength, tone & DTRs normal.     Assessment &  Plan:  #1 tinnitus, resolved  #2 abnormal muscular activity; possible fasciculations  #3 history of cervical radiculopathy symptoms intermittently.  #4 black stool 1  Plan: See orders and recommendations

## 2015-11-30 NOTE — Progress Notes (Signed)
Pre visit review using our clinic review tool, if applicable. No additional management support is needed unless otherwise documented below in the visit note. 

## 2015-12-01 ENCOUNTER — Telehealth: Payer: Self-pay

## 2015-12-01 NOTE — Telephone Encounter (Signed)
-----   Message from Pecola LawlessWilliam F Hopper, MD sent at 11/30/2015  5:10 PM EST ----- Please add A1c (R73.9)

## 2015-12-01 NOTE — Telephone Encounter (Signed)
[facility]

## 2015-12-02 ENCOUNTER — Other Ambulatory Visit: Payer: Self-pay | Admitting: Internal Medicine

## 2015-12-02 DIAGNOSIS — R202 Paresthesia of skin: Secondary | ICD-10-CM

## 2015-12-02 DIAGNOSIS — D649 Anemia, unspecified: Secondary | ICD-10-CM

## 2015-12-03 ENCOUNTER — Other Ambulatory Visit: Payer: Self-pay | Admitting: Internal Medicine

## 2015-12-03 ENCOUNTER — Other Ambulatory Visit (INDEPENDENT_AMBULATORY_CARE_PROVIDER_SITE_OTHER): Payer: Medicare Other

## 2015-12-03 DIAGNOSIS — R739 Hyperglycemia, unspecified: Secondary | ICD-10-CM | POA: Diagnosis not present

## 2015-12-03 LAB — HEMOGLOBIN A1C: Hgb A1c MFr Bld: 6.1 % (ref 4.6–6.5)

## 2015-12-03 NOTE — Progress Notes (Signed)
   Subjective:    Patient ID: Adam Weeks, male    DOB: 1927/04/22, 81 y.o.   MRN: [id number]  HPI    Review of Systems     Objective:   Physical Exam        Assessment & Plan:  See 11/30/15 labs ; CBC & iron studies in 2-3 weeks, sooner if any signs of GI bleed.

## 2015-12-03 NOTE — Addendum Note (Signed)
Addended by: Verlan FriendsAIRRIKIER DAVIDSON, Lindberg Zenon M on: 12/03/2015 12:22 PM   Modules accepted: Orders

## 2015-12-05 ENCOUNTER — Encounter: Payer: Self-pay | Admitting: Internal Medicine

## 2015-12-05 ENCOUNTER — Telehealth: Payer: Self-pay | Admitting: Internal Medicine

## 2015-12-05 ENCOUNTER — Ambulatory Visit (INDEPENDENT_AMBULATORY_CARE_PROVIDER_SITE_OTHER): Payer: Medicare Other | Admitting: Internal Medicine

## 2015-12-05 VITALS — BP 140/80 | HR 77 | Wt 176.0 lb

## 2015-12-05 DIAGNOSIS — M129 Arthropathy, unspecified: Secondary | ICD-10-CM

## 2015-12-05 DIAGNOSIS — D649 Anemia, unspecified: Secondary | ICD-10-CM | POA: Diagnosis not present

## 2015-12-05 DIAGNOSIS — M19011 Primary osteoarthritis, right shoulder: Secondary | ICD-10-CM

## 2015-12-05 MED ORDER — METHYLPREDNISOLONE ACETATE 80 MG/ML IJ SUSP
80.0000 mg | Freq: Once | INTRAMUSCULAR | Status: AC
  Administered 2015-12-06: 80 mg via INTRA_ARTICULAR

## 2015-12-05 NOTE — Telephone Encounter (Signed)
States he woke up this morning with shoulder pain.  He is requesting Dr. Macario GoldsPlot to work him in today for a shoulder injection because he is leaving this afternoon to go out of town.  Patient is also requesting last lab results.

## 2015-12-05 NOTE — Assessment & Plan Note (Signed)
See Procedure 

## 2015-12-05 NOTE — Progress Notes (Signed)
Subjective:  Patient ID: Adam Weeks, male    DOB: June 27, 1927  Age: 81 y.o. MRN: [id number]  CC: No chief complaint on file.   HPI Adam Weeks presents for R shoulder pain - worse. C/o skin abrasions - hand R and forearm L. C/o anemia and fatigue  Outpatient Prescriptions Prior to Visit  Medication Sig Dispense Refill  . acetaminophen (TYLENOL) 500 MG tablet Take 1,000 mg by mouth every 6 (six) hours as needed for mild pain.    [name] allopurinol (ZYLOPRIM) 100 MG tablet Take 1 tablet (100 mg total) by mouth daily. 30 tablet 0  . amLODipine (NORVASC) 5 MG tablet Take by mouth daily.    [name] aspirin EC 325 MG tablet Take 1 tablet (325 mg total) by mouth daily. 90 tablet 0  . atorvastatin (LIPITOR) 40 MG tablet Take 1 tablet (40 mg total) by mouth daily. 30 tablet 0  . carvedilol (COREG) 12.5 MG tablet Take 12.5 mg by mouth 2 (two) times daily with a meal.    . Cholecalciferol 1000 UNITS tablet Take 1,000 Units by mouth daily.      . colchicine (COLCRYS) 0.6 MG tablet Take 1 tablet (0.6 mg total) by mouth daily. 100 tablet 3  . docusate sodium (COLACE) 100 MG capsule Take 200 mg by mouth 2 (two) times daily.    . finasteride (PROSCAR) 5 MG tablet Take 5 mg by mouth daily.    . furosemide (LASIX) 40 MG tablet Take 40 mg by mouth daily.    [name] HYDROmorphone (DILAUDID) 4 MG tablet Take 0.5-1 tablets (2-4 mg total) by mouth every 6 (six) hours as needed for severe pain. 120 tablet 0  . lidocaine (LIDODERM) 5 % Place 1 patch onto the skin daily. Remove & Discard patch within 12 hours or as directed by MD    . Magnesium 200 MG TABS 1 po qd (Patient taking differently: 1 each by Per post-pyloric tube route daily. ) 100 each 3  . Multiple Vitamins-Minerals (OCUVITE PRESERVISION) TABS Take 2 tablets by mouth 2 (two) times daily.      [name] omeprazole (PRILOSEC) 20 MG capsule Take 20 mg by mouth daily.      . predniSONE (DELTASONE) 1 MG tablet Take total of 9 mg a day x 1 month, then 8 mg/d x 1 month, then  7 mg a day x 1 month and so on. Use pc 120 tablet 5  . predniSONE (DELTASONE) 5 MG tablet Take total of 9 mg a day x 1 month, then 8 mg/d x 1 month, then 7 mg a day x 1 month and so on. Use pc 30 tablet 5  . tobramycin (TOBREX) 0.3 % ophthalmic solution INSTILL 1 DROP INTO RIGHT EYE FOUR TIMES A DAY. BEGIN 1 DAY PRIOR...  (REFER TO PRESCRIPTION NOTES).  0  . Venlafaxine HCl 225 MG TB24 Take 225 mg by mouth daily.     Facility-Administered Medications Prior to Visit  Medication Dose Route Frequency Provider Last Rate Last Dose  . methylPREDNISolone acetate (DEPO-MEDROL) injection 40 mg  40 mg Intra-articular Once Yael Angerer V, MD      . methylPREDNISolone acetate (DEPO-MEDROL) injection 80 mg  80 mg Intra-articular Once Branna Cortina V, MD        ROS Review of Systems  Constitutional: Positive for fatigue. Negative for appetite change and unexpected weight change.  HENT: Negative for congestion, nosebleeds, sneezing, sore throat and trouble swallowing.   Eyes: Negative for itching and  visual disturbance.  Respiratory: Negative for cough.   Cardiovascular: Positive for leg swelling. Negative for chest pain and palpitations.  Gastrointestinal: Negative for nausea, diarrhea, blood in stool and abdominal distention.  Genitourinary: Negative for hematuria.  Musculoskeletal: Positive for arthralgias. Negative for back pain, joint swelling, gait problem and neck pain.  Skin: Negative for rash.  Neurological: Negative for dizziness, tremors, speech difficulty and weakness.  Psychiatric/Behavioral: Negative for sleep disturbance.    Objective:  BP 140/80 mmHg  Pulse 77  Wt 176 lb (79.833 kg)  SpO2 94%  BP Readings from Last 3 Encounters:  12/05/15 140/80  11/30/15 132/70  11/01/15 112/78    Wt Readings from Last 3 Encounters:  12/05/15 176 lb (79.833 kg)  11/30/15 178 lb (80.74 kg)  12/25/14 166 lb (75.297 kg)    Physical Exam  Constitutional: He is oriented to person,  place, and time. He appears well-developed. No distress.  NAD  HENT:  Mouth/Throat: Oropharynx is clear and moist.  Eyes: Conjunctivae are normal. Pupils are equal, round, and reactive to light.  Neck: Normal range of motion. No JVD present. No thyromegaly present.  Cardiovascular: Normal rate, regular rhythm, normal heart sounds and intact distal pulses.  Exam reveals no gallop and no friction rub.   No murmur heard. Pulmonary/Chest: Effort normal and breath sounds normal. No respiratory distress. He has no wheezes. He has no rales. He exhibits no tenderness.  Abdominal: Soft. Bowel sounds are normal. He exhibits no distension and no mass. There is no tenderness. There is no rebound and no guarding.  Musculoskeletal: Normal range of motion. He exhibits no edema or tenderness.  Lymphadenopathy:    He has no cervical adenopathy.  Neurological: He is alert and oriented to person, place, and time. He has normal reflexes. No cranial nerve deficit. He exhibits normal muscle tone. He displays a negative Romberg sign. Coordination and gait normal.  Skin: Skin is warm and dry. No rash noted.  Psychiatric: He has a normal mood and affect. His behavior is normal. Judgment and thought content normal.  R shoulder is painful   Procedure :Joint Injection, R  shoulder   Indication:  Subacromial bursitis with refractory  chronic pain.   Risks including unsuccessful procedure , bleeding, infection, bruising, skin atrophy, "steroid flare-up" and others were explained to the patient in detail as well as the benefits. Informed consent was obtained and signed.   Tthe patient was placed in a comfortable position. Lateral approach was used. Skin was prepped with Betadine and alcohol  and anesthetized with a cooling spray. Then, a 5 cc syringe with a 2 inch long 24-gauge needle was used for a joint injection.. The needle was advanced  Into the subacromial space.The bursa was injected with 3 mL of 2% lidocaine and 80  mg of Depo-Medrol .  Band-Aid was applied.   Tolerated well. Complications: None. Good pain relief following the procedure.      Lab Results  Component Value Date   WBC 11.0* 11/30/2015   HGB 11.5* 11/30/2015   HCT 35.0* 11/30/2015   PLT 186.0 11/30/2015   GLUCOSE 116* 11/30/2015   CHOL 115 06/04/2014   TRIG 110 06/04/2014   HDL 31* 06/04/2014   LDLCALC 62 06/04/2014   ALT 18 09/28/2014   AST 22 09/28/2014   NA 141 11/30/2015   K 4.4 11/30/2015   CL 103 11/30/2015   CREATININE 1.57* 11/30/2015   BUN 38* 11/30/2015   CO2 28 11/30/2015   TSH 3.29 11/30/2015  PSA 0.23 12/27/2013   INR 1.49 11/21/2009   HGBA1C 6.1 12/03/2015    Dg Chest 2 View  06/03/2014  CLINICAL DATA:  Chest pain, weakness, body aches EXAM: CHEST  2 VIEW COMPARISON:  Prior chest x-ray 02/03/2014 FINDINGS: Very low inspiratory volumes with bibasilar subsegmental atelectasis. Cardiac and mediastinal contours are unchanged. There is mild cardiomegaly and atherosclerosis of the tortuous thoracic aorta. 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 The mid and upper lungs are clear without evidence of a pneumothorax or pleural effusion. Background bronchitic changes and interstitial prominence are similar compared to prior. No acute osseous abnormality. Incompletely imaged thoracolumbar compression fracture with changes of prior vertebroplasty. IMPRESSION: Very low inspiratory volumes with bibasilar subsegmental atelectasis. Otherwise, stable chest x-ray without evidence of acute cardiopulmonary disease. Electronically Signed   By: Malachy Moan M.D.   On: 06/03/2014 08:49   Ct Head Wo Contrast  06/03/2014  CLINICAL DATA:  Headache, dysphagia, weakness EXAM: CT HEAD WITHOUT CONTRAST TECHNIQUE: Contiguous axial images were obtained from the base of the skull through the vertex without intravenous contrast. COMPARISON:  None. FINDINGS: No evidence of parenchymal hemorrhage or extra-axial fluid  collection. No mass lesion, mass effect, or midline shift. No CT evidence of acute infarction. Subcortical white matter and periventricular small vessel ischemic changes. Age related atrophy.  No ventriculomegaly. The visualized paranasal sinuses are essentially clear. The mastoid air cells are unopacified. No evidence of calvarial fracture. IMPRESSION: No evidence of acute intracranial abnormality. Atrophy with small vessel ischemic changes. Electronically Signed   By: Charline Bills M.D.   On: 06/03/2014 08:29   Mri Brain Without Contrast  06/04/2014  CLINICAL DATA:  Altered mental status. Weakness and upper extremity pain. Slurred speech. Symptoms resolved at the time of evaluation. EXAM: MRI HEAD WITHOUT CONTRAST MRA HEAD WITHOUT CONTRAST TECHNIQUE: Multiplanar, multiecho pulse sequences of the brain and surrounding structures were obtained without intravenous contrast. Angiographic images of the head were obtained using MRA technique without contrast. COMPARISON:  CT head without contrast 06/03/2014. FINDINGS: MRI HEAD FINDINGS The diffusion-weighted images demonstrate no evidence for acute or subacute infarction. A punctate area of susceptibility in the right cerebellum is compatible with a remote hemorrhage. No acute hemorrhage or mass lesion is present. Mild atrophy and minimal white matter disease is within normal limits for age. Flow is present in the major intracranial arteries. The patient is status post bilateral lens extractions. The globes and orbits are otherwise intact. The paranasal sinuses and mastoid air cells are clear. MRA HEAD FINDINGS The internal carotid arteries are within normal limits from the high cervical segments through the ICA termini bilaterally. The A1 and M1 segments are normal. The study is mildly degraded by patient motion. The MCA bifurcations are intact. Mild segmental irregularity is noted in the distal MCA branch vessels. The left vertebral artery is slightly dominant  to the right. The PICA origins are visualized and normal. The basilar artery is within normal limits. The right posterior cerebral artery originates from the basilar tip. The left posterior cerebral artery is of fetal type. IMPRESSION: 1. No acute or focal abnormality to explain the patient's symptoms. 2. Punctate area of remote hemorrhage in the right cerebellum is nonspecific. No discrete lesion is evident on other sequences. 3. Otherwise normal MRI of the brain for age. 4. Minimal distal small vessel disease is evident on the MRA. No significant proximal stenosis, aneurysm, or branch vessel occlusion is present. Electronically Signed   By: Thayer Ohm  Mattern M.D.   On: 06/04/2014 13:36   Mr Maxine Glenn Head/brain Wo Cm  06/04/2014  CLINICAL DATA:  Altered mental status. Weakness and upper extremity pain. Slurred speech. Symptoms resolved at the time of evaluation. EXAM: MRI HEAD WITHOUT CONTRAST MRA HEAD WITHOUT CONTRAST TECHNIQUE: Multiplanar, multiecho pulse sequences of the brain and surrounding structures were obtained without intravenous contrast. Angiographic images of the head were obtained using MRA technique without contrast. COMPARISON:  CT head without contrast 06/03/2014. FINDINGS: MRI HEAD FINDINGS The diffusion-weighted images demonstrate no evidence for acute or subacute infarction. A punctate area of susceptibility in the right cerebellum is compatible with a remote hemorrhage. No acute hemorrhage or mass lesion is present. Mild atrophy and minimal white matter disease is within normal limits for age. Flow is present in the major intracranial arteries. The patient is status post bilateral lens extractions. The globes and orbits are otherwise intact. The paranasal sinuses and mastoid air cells are clear. MRA HEAD FINDINGS The internal carotid arteries are within normal limits from the high cervical segments through the ICA termini bilaterally. The A1 and M1 segments are normal. The study is mildly degraded  by patient motion. The MCA bifurcations are intact. Mild segmental irregularity is noted in the distal MCA branch vessels. The left vertebral artery is slightly dominant to the right. The PICA origins are visualized and normal. The basilar artery is within normal limits. The right posterior cerebral artery originates from the basilar tip. The left posterior cerebral artery is of fetal type. IMPRESSION: 1. No acute or focal abnormality to explain the patient's symptoms. 2. Punctate area of remote hemorrhage in the right cerebellum is nonspecific. No discrete lesion is evident on other sequences. 3. Otherwise normal MRI of the brain for age. 4. Minimal distal small vessel disease is evident on the MRA. No significant proximal stenosis, aneurysm, or branch vessel occlusion is present. Electronically Signed   By: Gennette Pac M.D.   On: 06/04/2014 13:36    Assessment & Plan:   There are no diagnoses linked to this encounter. I am having Mr. Lautner maintain his Cholecalciferol, OCUVITE PRESERVISION, omeprazole, carvedilol, acetaminophen, finasteride, docusate sodium, lidocaine, Venlafaxine HCl, furosemide, allopurinol, aspirin EC, atorvastatin, Magnesium, colchicine, amLODipine, predniSONE, predniSONE, HYDROmorphone, tobramycin, and amoxicillin. We will continue to administer methylPREDNISolone acetate and methylPREDNISolone acetate.  Meds ordered this encounter  Medications  . amoxicillin (AMOXIL) 500 MG capsule    Sig: take 4 capsules by mouth 1 hour BEFORE APPOINTMENT    Refill:  0     Follow-up: No Follow-up on file.  Sonda Primes, MD

## 2015-12-05 NOTE — Telephone Encounter (Signed)
Just got the message. Ok w/in this pm Thx

## 2015-12-05 NOTE — Progress Notes (Signed)
Pre visit review using our clinic review tool, if applicable. No additional management support is needed unless otherwise documented below in the visit note. 

## 2015-12-05 NOTE — Patient Instructions (Signed)
Postprocedure instructions :    A Band-Aid should be left on for 12 hours. Injection therapy is not a cure itself. It is used in conjunction with other modalities. You can use nonsteroidal anti-inflammatories like ibuprofen , hot and cold compresses. Rest is recommended in the next 24 hours. You need to report immediately  if fever, chills or any signs of infection develop. 

## 2015-12-05 NOTE — Telephone Encounter (Signed)
Pt informed of MD response below.   Pt has been scheduled for today at 4:15pm.

## 2015-12-05 NOTE — Assessment & Plan Note (Signed)
Uncertain etiology, not significant - chronic disease/CRF; [facility] D[facility] Will monitor

## 2015-12-06 DIAGNOSIS — M129 Arthropathy, unspecified: Secondary | ICD-10-CM | POA: Diagnosis not present

## 2015-12-06 DIAGNOSIS — D649 Anemia, unspecified: Secondary | ICD-10-CM | POA: Diagnosis not present

## 2015-12-27 ENCOUNTER — Telehealth: Payer: Self-pay | Admitting: Internal Medicine

## 2015-12-27 MED ORDER — HYDROMORPHONE HCL 4 MG PO TABS: 2.0000 mg | ORAL_TABLET | Freq: Four times a day (QID) | ORAL | Status: DC | PRN

## 2015-12-27 NOTE — Telephone Encounter (Signed)
Refilled and signed

## 2015-12-27 NOTE — Telephone Encounter (Signed)
Ok to Rf in PCP's absence? Thanks! 

## 2015-12-27 NOTE — Telephone Encounter (Signed)
Pt requesting refill for HYDROmorphone (DILAUDID) 4 MG tablet [[id number]][[id number]]  Please call [phone] when ready

## 2015-12-28 NOTE — Telephone Encounter (Signed)
Rx upfront. Left detailed mess informing pt.

## 2016-01-22 ENCOUNTER — Telehealth: Payer: Self-pay | Admitting: *Deleted

## 2016-01-22 MED ORDER — HYDROMORPHONE HCL 4 MG PO TABS: 2.0000 mg | ORAL_TABLET | Freq: Four times a day (QID) | ORAL | Status: DC | PRN

## 2016-01-22 MED ORDER — PREDNISONE 1 MG PO TABS: ORAL_TABLET | ORAL | Status: DC

## 2016-01-22 MED ORDER — PREDNISONE 5 MG PO TABS: ORAL_TABLET | ORAL | Status: DC

## 2016-01-22 NOTE — Telephone Encounter (Signed)
Pt requesting refills on hid Hydromorphone & Prednisone...Raechel Chute

## 2016-01-22 NOTE — Telephone Encounter (Signed)
Notified pt rx ready for pick-up.../lmb 

## 2016-01-22 NOTE — Telephone Encounter (Signed)
OK to fill this prescription with additional refills xo both OV q 3 mo Thank you!

## 2016-02-26 ENCOUNTER — Telehealth: Payer: Self-pay | Admitting: *Deleted

## 2016-02-26 NOTE — Telephone Encounter (Signed)
Left msg on triage requesting refills on Prednisone 10 mg & Hydromorphone...Raechel Chute

## 2016-02-26 NOTE — Telephone Encounter (Signed)
Ok to ref both Needs OV Thx

## 2016-02-27 MED ORDER — PREDNISONE 5 MG PO TABS: ORAL_TABLET | ORAL | Status: DC

## 2016-02-27 MED ORDER — HYDROMORPHONE HCL 4 MG PO TABS: 2.0000 mg | ORAL_TABLET | Freq: Four times a day (QID) | ORAL | Status: DC | PRN

## 2016-02-27 NOTE — Telephone Encounter (Signed)
Called pt no answer LMOM rx ready for pick-up, also will need to make appt for next refill...Raechel Chute

## 2016-03-14 ENCOUNTER — Telehealth: Payer: Self-pay | Admitting: *Deleted

## 2016-03-14 MED ORDER — PREDNISONE 10 MG PO TABS: ORAL_TABLET | ORAL | Status: DC

## 2016-03-14 NOTE — Telephone Encounter (Signed)
Ok 10 mg Deltasone x 4 weeks Thx

## 2016-03-14 NOTE — Telephone Encounter (Signed)
Left smg on triage stating he was taking 3 mg of prednisone, but his PMR has flared back up wanting to see if md can send in rx for prednisone 10 mg.../lmb

## 2016-03-17 NOTE — Telephone Encounter (Signed)
Called pt no answer LMOM md sent prednisone to pharmacy!...Adam Weeks/lmb

## 2016-03-18 ENCOUNTER — Ambulatory Visit (INDEPENDENT_AMBULATORY_CARE_PROVIDER_SITE_OTHER): Payer: Medicare Other | Admitting: Internal Medicine

## 2016-03-18 ENCOUNTER — Encounter: Payer: Self-pay | Admitting: Internal Medicine

## 2016-03-18 VITALS — BP 110/60 | HR 75 | Wt 170.0 lb

## 2016-03-18 DIAGNOSIS — M5440 Lumbago with sciatica, unspecified side: Secondary | ICD-10-CM | POA: Diagnosis not present

## 2016-03-18 DIAGNOSIS — M353 Polymyalgia rheumatica: Secondary | ICD-10-CM

## 2016-03-18 DIAGNOSIS — I2510 Atherosclerotic heart disease of native coronary artery without angina pectoris: Secondary | ICD-10-CM

## 2016-03-18 MED ORDER — PREDNISONE 10 MG PO TABS: ORAL_TABLET | ORAL | Status: DC

## 2016-03-18 MED ORDER — METHYLPREDNISOLONE ACETATE 80 MG/ML IJ SUSP
80.0000 mg | Freq: Once | INTRAMUSCULAR | Status: AC
  Administered 2016-03-18: 80 mg via INTRAMUSCULAR

## 2016-03-18 MED ORDER — HYDROMORPHONE HCL 4 MG PO TABS: 2.0000 mg | ORAL_TABLET | Freq: Four times a day (QID) | ORAL | Status: DC | PRN

## 2016-03-18 NOTE — Progress Notes (Signed)
Pre visit review using our clinic review tool, if applicable. No additional management support is needed unless otherwise documented below in the visit note. 

## 2016-03-18 NOTE — Progress Notes (Signed)
Subjective:  Patient ID: Adam Weeks, male    DOB: May 02, 1927  Age: 82 y.o. MRN: [id number]  CC: No chief complaint on file.   HPI Adam Weeks presents for severe LBP, gout, HTN f/u  Outpatient Prescriptions Prior to Visit  Medication Sig Dispense Refill  . acetaminophen (TYLENOL) 500 MG tablet Take 1,000 mg by mouth every 6 (six) hours as needed for mild pain.    [name] allopurinol (ZYLOPRIM) 100 MG tablet Take 1 tablet (100 mg total) by mouth daily. 30 tablet 0  . amLODipine (NORVASC) 5 MG tablet Take by mouth daily.    [name] amoxicillin (AMOXIL) 500 MG capsule take 4 capsules by mouth 1 hour BEFORE APPOINTMENT  0  . aspirin EC 325 MG tablet Take 1 tablet (325 mg total) by mouth daily. 90 tablet 0  . atorvastatin (LIPITOR) 40 MG tablet Take 1 tablet (40 mg total) by mouth daily. 30 tablet 0  . carvedilol (COREG) 12.5 MG tablet Take 12.5 mg by mouth 2 (two) times daily with a meal.    . Cholecalciferol 1000 UNITS tablet Take 1,000 Units by mouth daily.      . colchicine (COLCRYS) 0.6 MG tablet Take 1 tablet (0.6 mg total) by mouth daily. 100 tablet 3  . docusate sodium (COLACE) 100 MG capsule Take 200 mg by mouth 2 (two) times daily.    . finasteride (PROSCAR) 5 MG tablet Take 5 mg by mouth daily.    . furosemide (LASIX) 40 MG tablet Take 40 mg by mouth daily.    [name] HYDROmorphone (DILAUDID) 4 MG tablet Take 0.5-1 tablets (2-4 mg total) by mouth every 6 (six) hours as needed for severe pain. 120 tablet 0  . lidocaine (LIDODERM) 5 % Place 1 patch onto the skin daily. Remove & Discard patch within 12 hours or as directed by MD    . Magnesium 200 MG TABS 1 po qd (Patient taking differently: 1 each by Per post-pyloric tube route daily. ) 100 each 3  . Multiple Vitamins-Minerals (OCUVITE PRESERVISION) TABS Take 2 tablets by mouth 2 (two) times daily.      [name] omeprazole (PRILOSEC) 20 MG capsule Take 20 mg by mouth daily.      . predniSONE (DELTASONE) 10 MG tablet 1 po qd pc 30 tablet 0  .  predniSONE (DELTASONE) 5 MG tablet Take total of 9 mg a day x 1 month, then 8 mg/d x 1 month, then 7 mg a day x 1 month and so on. Use pc 30 tablet 5  . tobramycin (TOBREX) 0.3 % ophthalmic solution INSTILL 1 DROP INTO RIGHT EYE FOUR TIMES A DAY. BEGIN 1 DAY PRIOR...  (REFER TO PRESCRIPTION NOTES).  0  . Venlafaxine HCl 225 MG TB24 Take 225 mg by mouth daily.    . predniSONE (DELTASONE) 1 MG tablet Take total of 9 mg a day x 1 month, then 8 mg/d x 1 month, then 7 mg a day x 1 month and so on. Use pc (Patient not taking: Reported on 03/18/2016) 120 tablet 5   Facility-Administered Medications Prior to Visit  Medication Dose Route Frequency Provider Last Rate Last Dose  . methylPREDNISolone acetate (DEPO-MEDROL) injection 40 mg  40 mg Intra-articular Once Aleksei Plotnikov V, MD      . methylPREDNISolone acetate (DEPO-MEDROL) injection 80 mg  80 mg Intra-articular Once Aleksei Plotnikov V, MD        ROS Review of Systems  Constitutional: Positive for fatigue. Negative for  appetite change and unexpected weight change.  HENT: Negative for congestion, nosebleeds, sneezing, sore throat and trouble swallowing.   Eyes: Negative for itching and visual disturbance.  Respiratory: Negative for cough.   Cardiovascular: Negative for chest pain, palpitations and leg swelling.  Gastrointestinal: Positive for constipation. Negative for nausea, diarrhea, blood in stool and abdominal distention.  Genitourinary: Negative for frequency and hematuria.  Musculoskeletal: Positive for back pain and arthralgias. Negative for joint swelling, gait problem and neck pain.  Skin: Negative for rash.  Neurological: Negative for dizziness, tremors, speech difficulty and weakness.  Psychiatric/Behavioral: Positive for sleep disturbance. Negative for suicidal ideas, dysphoric mood and agitation. The patient is not nervous/anxious.     Objective:  BP 110/60 mmHg  Pulse 75  Wt 170 lb (77.111 kg)  SpO2 96%  BP Readings from  Last 3 Encounters:  03/18/16 110/60  12/05/15 140/80  11/30/15 132/70    Wt Readings from Last 3 Encounters:  03/18/16 170 lb (77.111 kg)  12/05/15 176 lb (79.833 kg)  11/30/15 178 lb (80.74 kg)    Physical Exam  Constitutional: He is oriented to person, place, and time. He appears well-developed. No distress.  NAD  HENT:  Mouth/Throat: Oropharynx is clear and moist.  Eyes: Conjunctivae are normal. Pupils are equal, round, and reactive to light.  Neck: Normal range of motion. No JVD present. No thyromegaly present.  Cardiovascular: Normal rate, regular rhythm, normal heart sounds and intact distal pulses.  Exam reveals no gallop and no friction rub.   No murmur heard. Pulmonary/Chest: Effort normal and breath sounds normal. No respiratory distress. He has no wheezes. He has no rales. He exhibits no tenderness.  Abdominal: Soft. Bowel sounds are normal. He exhibits no distension and no mass. There is no tenderness. There is no rebound and no guarding.  Musculoskeletal: Normal range of motion. He exhibits tenderness. He exhibits no edema.  Lymphadenopathy:    He has no cervical adenopathy.  Neurological: He is alert and oriented to person, place, and time. He has normal reflexes. No cranial nerve deficit. He exhibits normal muscle tone. He displays a negative Romberg sign. Coordination abnormal. Gait normal.  Skin: Skin is warm and dry. No rash noted.  Psychiatric: He has a normal mood and affect. His behavior is normal. Judgment and thought content normal.  LS stiff and tender   Lab Results  Component Value Date   WBC 11.0* 11/30/2015   HGB 11.5* 11/30/2015   HCT 35.0* 11/30/2015   PLT 186.0 11/30/2015   GLUCOSE 116* 11/30/2015   CHOL 115 06/04/2014   TRIG 110 06/04/2014   HDL 31* 06/04/2014   LDLCALC 62 06/04/2014   ALT 18 09/28/2014   AST 22 09/28/2014   NA 141 11/30/2015   K 4.4 11/30/2015   CL 103 11/30/2015   CREATININE 1.57* 11/30/2015   BUN 38* 11/30/2015   CO2  28 11/30/2015   TSH 3.29 11/30/2015   PSA 0.23 12/27/2013   INR 1.49 11/21/2009   HGBA1C 6.1 12/03/2015    Dg Chest 2 View  06/03/2014  CLINICAL DATA:  Chest pain, weakness, body aches EXAM: CHEST  2 VIEW COMPARISON:  Prior chest x-ray 02/03/2014 FINDINGS: Very low inspiratory volumes with bibasilar subsegmental atelectasis. Cardiac and mediastinal contours are unchanged. There is mild cardiomegaly and atherosclerosis of the tortuous thoracic aorta. 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 The mid and upper lungs are clear without evidence of a pneumothorax or pleural effusion. Background bronchitic changes  and interstitial prominence are similar compared to prior. No acute osseous abnormality. Incompletely imaged thoracolumbar compression fracture with changes of prior vertebroplasty. IMPRESSION: Very low inspiratory volumes with bibasilar subsegmental atelectasis. Otherwise, stable chest x-ray without evidence of acute cardiopulmonary disease. Electronically Signed   By: Malachy Moan M.D.   On: 06/03/2014 08:49   Ct Head Wo Contrast  06/03/2014  CLINICAL DATA:  Headache, dysphagia, weakness EXAM: CT HEAD WITHOUT CONTRAST TECHNIQUE: Contiguous axial images were obtained from the base of the skull through the vertex without intravenous contrast. COMPARISON:  None. FINDINGS: No evidence of parenchymal hemorrhage or extra-axial fluid collection. No mass lesion, mass effect, or midline shift. No CT evidence of acute infarction. Subcortical white matter and periventricular small vessel ischemic changes. Age related atrophy.  No ventriculomegaly. The visualized paranasal sinuses are essentially clear. The mastoid air cells are unopacified. No evidence of calvarial fracture. IMPRESSION: No evidence of acute intracranial abnormality. Atrophy with small vessel ischemic changes. Electronically Signed   By: Charline Bills M.D.   On: 06/03/2014 08:29   Mri Brain Without  Contrast  06/04/2014  CLINICAL DATA:  Altered mental status. Weakness and upper extremity pain. Slurred speech. Symptoms resolved at the time of evaluation. EXAM: MRI HEAD WITHOUT CONTRAST MRA HEAD WITHOUT CONTRAST TECHNIQUE: Multiplanar, multiecho pulse sequences of the brain and surrounding structures were obtained without intravenous contrast. Angiographic images of the head were obtained using MRA technique without contrast. COMPARISON:  CT head without contrast 06/03/2014. FINDINGS: MRI HEAD FINDINGS The diffusion-weighted images demonstrate no evidence for acute or subacute infarction. A punctate area of susceptibility in the right cerebellum is compatible with a remote hemorrhage. No acute hemorrhage or mass lesion is present. Mild atrophy and minimal white matter disease is within normal limits for age. Flow is present in the major intracranial arteries. The patient is status post bilateral lens extractions. The globes and orbits are otherwise intact. The paranasal sinuses and mastoid air cells are clear. MRA HEAD FINDINGS The internal carotid arteries are within normal limits from the high cervical segments through the ICA termini bilaterally. The A1 and M1 segments are normal. The study is mildly degraded by patient motion. The MCA bifurcations are intact. Mild segmental irregularity is noted in the distal MCA branch vessels. The left vertebral artery is slightly dominant to the right. The PICA origins are visualized and normal. The basilar artery is within normal limits. The right posterior cerebral artery originates from the basilar tip. The left posterior cerebral artery is of fetal type. IMPRESSION: 1. No acute or focal abnormality to explain the patient's symptoms. 2. Punctate area of remote hemorrhage in the right cerebellum is nonspecific. No discrete lesion is evident on other sequences. 3. Otherwise normal MRI of the brain for age. 4. Minimal distal small vessel disease is evident on the MRA. No  significant proximal stenosis, aneurysm, or branch vessel occlusion is present. Electronically Signed   By: Gennette Pac M.D.   On: 06/04/2014 13:36   Mr Maxine Glenn Head/brain Wo Cm  06/04/2014  CLINICAL DATA:  Altered mental status. Weakness and upper extremity pain. Slurred speech. Symptoms resolved at the time of evaluation. EXAM: MRI HEAD WITHOUT CONTRAST MRA HEAD WITHOUT CONTRAST TECHNIQUE: Multiplanar, multiecho pulse sequences of the brain and surrounding structures were obtained without intravenous contrast. Angiographic images of the head were obtained using MRA technique without contrast. COMPARISON:  CT head without contrast 06/03/2014. FINDINGS: MRI HEAD FINDINGS The diffusion-weighted images demonstrate no evidence for acute or subacute infarction. A  punctate area of susceptibility in the right cerebellum is compatible with a remote hemorrhage. No acute hemorrhage or mass lesion is present. Mild atrophy and minimal white matter disease is within normal limits for age. Flow is present in the major intracranial arteries. The patient is status post bilateral lens extractions. The globes and orbits are otherwise intact. The paranasal sinuses and mastoid air cells are clear. MRA HEAD FINDINGS The internal carotid arteries are within normal limits from the high cervical segments through the ICA termini bilaterally. The A1 and M1 segments are normal. The study is mildly degraded by patient motion. The MCA bifurcations are intact. Mild segmental irregularity is noted in the distal MCA branch vessels. The left vertebral artery is slightly dominant to the right. The PICA origins are visualized and normal. The basilar artery is within normal limits. The right posterior cerebral artery originates from the basilar tip. The left posterior cerebral artery is of fetal type. IMPRESSION: 1. No acute or focal abnormality to explain the patient's symptoms. 2. Punctate area of remote hemorrhage in the right cerebellum is  nonspecific. No discrete lesion is evident on other sequences. 3. Otherwise normal MRI of the brain for age. 4. Minimal distal small vessel disease is evident on the MRA. No significant proximal stenosis, aneurysm, or branch vessel occlusion is present. Electronically Signed   By: Gennette Pachris  Mattern M.D.   On: 06/04/2014 13:36    Assessment & Plan:   There are no diagnoses linked to this encounter. I am having Adam Weeks maintain his Cholecalciferol, OCUVITE PRESERVISION, omeprazole, carvedilol, acetaminophen, finasteride, docusate sodium, lidocaine, Venlafaxine HCl, furosemide, allopurinol, aspirin EC, atorvastatin, Magnesium, colchicine, amLODipine, tobramycin, amoxicillin, HYDROmorphone, predniSONE, and predniSONE. We will continue to administer methylPREDNISolone acetate and methylPREDNISolone acetate.  No orders of the defined types were placed in this encounter.     Follow-up: No Follow-up on file.  Sonda PrimesAlex Plotnikov, MD

## 2016-03-18 NOTE — Patient Instructions (Signed)
-   Hold Lipitor 

## 2016-03-18 NOTE — Assessment & Plan Note (Signed)
Coreg, ASA, Lipitor

## 2016-03-18 NOTE — Assessment & Plan Note (Signed)
Worse Predn was increased Depo-medrol 80 mg im Hold Lipitor

## 2016-03-26 NOTE — Assessment & Plan Note (Signed)
Prednisone Potential benefits of a long term steroid  use as well as potential risks  and complications were explained to the patient and were aknowledged.

## 2016-04-23 ENCOUNTER — Telehealth: Payer: Self-pay | Admitting: *Deleted

## 2016-04-23 NOTE — Telephone Encounter (Signed)
Received call pt requesting refill on his Hydromorphone, Prednisone 1 mg & Prednisone 4 mg.[name].Raechel Chute/lmb

## 2016-04-23 NOTE — Telephone Encounter (Signed)
Ok Dilaudid How much Predn is he on? Thx

## 2016-04-24 MED ORDER — PREDNISONE 1 MG PO TABS: ORAL_TABLET | ORAL | Status: DC

## 2016-04-24 MED ORDER — PREDNISONE 5 MG PO TABS: ORAL_TABLET | ORAL | Status: DC

## 2016-04-24 MED ORDER — HYDROMORPHONE HCL 4 MG PO TABS: 2.0000 mg | ORAL_TABLET | Freq: Four times a day (QID) | ORAL | Status: DC | PRN

## 2016-04-24 NOTE — Addendum Note (Signed)
Addended by: Tresa GarterPLOTNIKOV, Carden Teel V on: 04/24/2016 05:18 PM   Modules accepted: Orders, Medications

## 2016-04-24 NOTE — Telephone Encounter (Signed)
Notified pt w/ MD response he states he is taking total of 9 mg now. He is trying to come down to 5 mg, he stated he needs a rx for Prednisone 5 mg not 4mg . So he is requesting rx for Prednisone 1 mg & prednisone 5 mg...Raechel Chute/lmb

## 2016-04-25 MED ORDER — PREDNISONE 5 MG PO TABS: ORAL_TABLET | ORAL | Status: DC

## 2016-04-25 NOTE — Telephone Encounter (Signed)
MD had sent the prednisone, but 5 mg printed out, re-faxed to [facility]...Raechel Chute/lmb

## 2016-04-25 NOTE — Addendum Note (Signed)
Addended by: Deatra JamesBRAND, Vernida Mcnicholas M on: 04/25/2016 08:31 AM   Modules accepted: Orders

## 2016-05-27 ENCOUNTER — Telehealth: Payer: Self-pay | Admitting: *Deleted

## 2016-05-27 NOTE — Telephone Encounter (Signed)
OK to fill this prescription with additional refills x0 OV q 3 mo Thank you!  

## 2016-05-27 NOTE — Telephone Encounter (Signed)
Left msg on triage requesting refill on hydromorphone...Raechel Chute/lmb

## 2016-05-28 MED ORDER — HYDROMORPHONE HCL 4 MG PO TABS: 2.0000 mg | ORAL_TABLET | Freq: Four times a day (QID) | ORAL | Status: DC | PRN

## 2016-05-28 NOTE — Telephone Encounter (Signed)
Notified pt rx ready for pick-up.../lmb 

## 2016-06-30 ENCOUNTER — Telehealth: Payer: Self-pay | Admitting: *Deleted

## 2016-06-30 MED ORDER — HYDROMORPHONE HCL 4 MG PO TABS: 2.0000 mg | ORAL_TABLET | Freq: Four times a day (QID) | ORAL | Status: DC | PRN

## 2016-06-30 NOTE — Telephone Encounter (Signed)
Notified pt rx ready for pick-up.../lmb 

## 2016-06-30 NOTE — Telephone Encounter (Signed)
done

## 2016-06-30 NOTE — Telephone Encounter (Signed)
Receive call pt requesting refill on monthly hydromorphone. MD out of office this week pls advise...Raechel Chute/lmb

## 2016-07-18 ENCOUNTER — Telehealth: Payer: Self-pay | Admitting: *Deleted

## 2016-07-18 NOTE — Telephone Encounter (Signed)
OK to fill this prescription with additional refills x0 OV q 3 mo Thank you!  

## 2016-07-18 NOTE — Telephone Encounter (Signed)
Rec'd fax from pharmacy pt requesting refills on his Hydromorphone & Prednisone 10 mg.../lm,b

## 2016-07-21 MED ORDER — HYDROMORPHONE HCL 4 MG PO TABS: 2.0000 mg | ORAL_TABLET | Freq: Four times a day (QID) | ORAL | 0 refills | Status: DC | PRN

## 2016-07-21 MED ORDER — PREDNISONE 10 MG PO TABS: 10.0000 mg | ORAL_TABLET | Freq: Every day | ORAL | 3 refills | Status: DC

## 2016-07-21 NOTE — Telephone Encounter (Signed)
Notified pt rx for Hydromorphone ready for p/u. Prednisone refill sent electronically...Adam Weeks

## 2016-08-26 ENCOUNTER — Telehealth: Payer: Self-pay | Admitting: *Deleted

## 2016-08-26 NOTE — Telephone Encounter (Signed)
Left msg on triage requesting refill on his pain med Hydromorphone...Raechel Chute/lmb

## 2016-08-26 NOTE — Telephone Encounter (Signed)
OK 2 wks Rx Must see me q 3 mo Thx

## 2016-08-27 MED ORDER — HYDROMORPHONE HCL 4 MG PO TABS: 2.0000 mg | ORAL_TABLET | Freq: Four times a day (QID) | ORAL | 0 refills | Status: DC | PRN

## 2016-08-27 NOTE — Telephone Encounter (Signed)
Notified pt w/MD response. Place rx in cabinet for pick-up...Raechel Chute/lmb

## 2016-09-17 ENCOUNTER — Encounter: Payer: Self-pay | Admitting: Internal Medicine

## 2016-09-17 ENCOUNTER — Other Ambulatory Visit (INDEPENDENT_AMBULATORY_CARE_PROVIDER_SITE_OTHER): Payer: Medicare Other

## 2016-09-17 ENCOUNTER — Ambulatory Visit (INDEPENDENT_AMBULATORY_CARE_PROVIDER_SITE_OTHER): Payer: Medicare Other | Admitting: Internal Medicine

## 2016-09-17 VITALS — BP 140/80 | HR 76 | Temp 98.2°F | Wt 171.0 lb

## 2016-09-17 DIAGNOSIS — R739 Hyperglycemia, unspecified: Secondary | ICD-10-CM | POA: Diagnosis not present

## 2016-09-17 DIAGNOSIS — I129 Hypertensive chronic kidney disease with stage 1 through stage 4 chronic kidney disease, or unspecified chronic kidney disease: Secondary | ICD-10-CM

## 2016-09-17 DIAGNOSIS — Z23 Encounter for immunization: Secondary | ICD-10-CM

## 2016-09-17 DIAGNOSIS — R531 Weakness: Secondary | ICD-10-CM | POA: Diagnosis not present

## 2016-09-17 DIAGNOSIS — M353 Polymyalgia rheumatica: Secondary | ICD-10-CM

## 2016-09-17 DIAGNOSIS — G458 Other transient cerebral ischemic attacks and related syndromes: Secondary | ICD-10-CM | POA: Diagnosis not present

## 2016-09-17 DIAGNOSIS — M25511 Pain in right shoulder: Secondary | ICD-10-CM | POA: Diagnosis not present

## 2016-09-17 DIAGNOSIS — I2510 Atherosclerotic heart disease of native coronary artery without angina pectoris: Secondary | ICD-10-CM | POA: Diagnosis not present

## 2016-09-17 DIAGNOSIS — N039 Chronic nephritic syndrome with unspecified morphologic changes: Secondary | ICD-10-CM

## 2016-09-17 DIAGNOSIS — M5440 Lumbago with sciatica, unspecified side: Secondary | ICD-10-CM

## 2016-09-17 DIAGNOSIS — R413 Other amnesia: Secondary | ICD-10-CM

## 2016-09-17 LAB — BASIC METABOLIC PANEL
BUN: 34 mg/dL — AB (ref 6–23)
CHLORIDE: 102 meq/L (ref 96–112)
CO2: 28 meq/L (ref 19–32)
CREATININE: 1.68 mg/dL — AB (ref 0.40–1.50)
Calcium: 9.2 mg/dL (ref 8.4–10.5)
GFR: 41.27 mL/min — ABNORMAL LOW (ref >60.00)
Glucose, Bld: 128 mg/dL — ABNORMAL HIGH (ref 70–99)
POTASSIUM: 4.2 meq/L (ref 3.5–5.1)
Sodium: 140 mEq/L (ref 135–145)

## 2016-09-17 LAB — HEMOGLOBIN A1C: Hgb A1c MFr Bld: 6.1 % (ref 4.6–6.5)

## 2016-09-17 LAB — HEPATIC FUNCTION PANEL
ALBUMIN: 3.8 g/dL (ref 3.5–5.2)
ALK PHOS: 57 U/L (ref 39–117)
ALT: 15 U/L (ref 0–53)
AST: 19 U/L (ref 0–37)
BILIRUBIN TOTAL: 0.5 mg/dL (ref 0.2–1.2)
Bilirubin, Direct: 0 mg/dL (ref 0.0–0.3)
Total Protein: 6.9 g/dL (ref 6.0–8.3)

## 2016-09-17 MED ORDER — METHYLPREDNISOLONE ACETATE 80 MG/ML IJ SUSP
80.0000 mg | Freq: Once | INTRAMUSCULAR | Status: AC
  Administered 2016-09-17: 80 mg via INTRA_ARTICULAR

## 2016-09-17 MED ORDER — HYDROMORPHONE HCL 4 MG PO TABS: 2.0000 mg | ORAL_TABLET | Freq: Four times a day (QID) | ORAL | 0 refills | Status: DC | PRN

## 2016-09-17 MED ORDER — PREDNISONE 10 MG PO TABS: 10.0000 mg | ORAL_TABLET | Freq: Every day | ORAL | 3 refills | Status: DC

## 2016-09-17 NOTE — Addendum Note (Signed)
Addended by: Merrilyn PumaSIMMONS, Zi Newbury N on: 09/17/2016 03:12 PM   Modules accepted: Orders

## 2016-09-17 NOTE — Assessment & Plan Note (Signed)
Doing fair 

## 2016-09-17 NOTE — Progress Notes (Addendum)
Subjective:  Patient ID: Adam BoozeLeo A Carriero Jr., male    DOB: [birth year]  Age: 82 y.o. MRN: [id number]  CC: No chief complaint on file.   HPI Adam BoozeLeo A Laflam Jr. presents for chronic LBP, dyslipidemia f/u  Outpatient Medications Prior to Visit  Medication Sig Dispense Refill  . acetaminophen (TYLENOL) 500 MG tablet Take 1,000 mg by mouth every 6 (six) hours as needed for mild pain.    [name]. allopurinol (ZYLOPRIM) 100 MG tablet Take 1 tablet (100 mg total) by mouth daily. 30 tablet 0  . amLODipine (NORVASC) 5 MG tablet Take by mouth daily.    [name]. amoxicillin (AMOXIL) 500 MG capsule take 4 capsules by mouth 1 hour BEFORE APPOINTMENT  0  . aspirin EC 325 MG tablet Take 1 tablet (325 mg total) by mouth daily. 90 tablet 0  . atorvastatin (LIPITOR) 40 MG tablet Take 1 tablet (40 mg total) by mouth daily. 30 tablet 0  . carvedilol (COREG) 12.5 MG tablet Take 12.5 mg by mouth 2 (two) times daily with a meal.    . Cholecalciferol 1000 UNITS tablet Take 1,000 Units by mouth daily.      . colchicine (COLCRYS) 0.6 MG tablet Take 1 tablet (0.6 mg total) by mouth daily. 100 tablet 3  . docusate sodium (COLACE) 100 MG capsule Take 200 mg by mouth 2 (two) times daily.    . finasteride (PROSCAR) 5 MG tablet Take 5 mg by mouth daily.    . furosemide (LASIX) 40 MG tablet Take 40 mg by mouth daily.    [name]. HYDROmorphone (DILAUDID) 4 MG tablet Take 0.5-1 tablets (2-4 mg total) by mouth every 6 (six) hours as needed for severe pain. 30 tablet 0  . lidocaine (LIDODERM) 5 % Place 1 patch onto the skin daily. Remove & Discard patch within 12 hours or as directed by MD    . Magnesium 200 MG TABS 1 po qd (Patient taking differently: 1 each by Per post-pyloric tube route daily. ) 100 each 3  . Multiple Vitamins-Minerals (OCUVITE PRESERVISION) TABS Take 2 tablets by mouth 2 (two) times daily.      [name]. omeprazole (PRILOSEC) 20 MG capsule Take 20 mg by mouth daily.      . predniSONE (DELTASONE) 1 MG tablet As directed 200 tablet 1  .  predniSONE (DELTASONE) 10 MG tablet Take 1 tablet (10 mg total) by mouth daily with breakfast. 30 tablet 3  . tobramycin (TOBREX) 0.3 % ophthalmic solution INSTILL 1 DROP INTO RIGHT EYE FOUR TIMES A DAY. BEGIN 1 DAY PRIOR...  (REFER TO PRESCRIPTION NOTES).  0  . Venlafaxine HCl 225 MG TB24 Take 225 mg by mouth daily.     Facility-Administered Medications Prior to Visit  Medication Dose Route Frequency Provider Last Rate Last Dose  . methylPREDNISolone acetate (DEPO-MEDROL) injection 40 mg  40 mg Intra-articular Once Georgina QuintAleksei V Adolphus Hanf, MD      . methylPREDNISolone acetate (DEPO-MEDROL) injection 80 mg  80 mg Intra-articular Once Tresa GarterAleksei V Sohan Potvin, MD        ROS Review of Systems  Constitutional: Positive for fatigue. Negative for appetite change and unexpected weight change.  HENT: Negative for congestion, nosebleeds, sneezing, sore throat and trouble swallowing.   Eyes: Negative for itching and visual disturbance.  Respiratory: Negative for cough.   Cardiovascular: Negative for chest pain, palpitations and leg swelling.  Gastrointestinal: Negative for abdominal distention, blood in stool, diarrhea and nausea.  Genitourinary: Negative for frequency and hematuria.  Musculoskeletal: Positive for  back pain and neck stiffness. Negative for gait problem, joint swelling and neck pain.  Skin: Negative for rash.  Neurological: Positive for weakness. Negative for dizziness, tremors and speech difficulty.  Psychiatric/Behavioral: Negative for agitation, dysphoric mood and sleep disturbance. The patient is not nervous/anxious.     Objective:  BP 140/80   Pulse 76   Wt 171 lb (77.6 kg)   SpO2 95%   BMI 24.19 kg/m   BP Readings from Last 3 Encounters:  09/17/16 140/80  03/18/16 110/60  12/05/15 140/80    Wt Readings from Last 3 Encounters:  09/17/16 171 lb (77.6 kg)  03/18/16 170 lb (77.1 kg)  12/05/15 176 lb (79.8 kg)    Physical Exam  Constitutional: He is oriented to person,  place, and time. He appears well-developed. No distress.  NAD  HENT:  Mouth/Throat: Oropharynx is clear and moist.  Eyes: Conjunctivae are normal. Pupils are equal, round, and reactive to light.  Neck: Normal range of motion. No JVD present. No thyromegaly present.  Cardiovascular: Normal rate, regular rhythm, normal heart sounds and intact distal pulses.  Exam reveals no gallop and no friction rub.   No murmur heard. Pulmonary/Chest: Effort normal and breath sounds normal. No respiratory distress. He has no wheezes. He has no rales. He exhibits no tenderness.  Abdominal: Soft. Bowel sounds are normal. He exhibits no distension and no mass. There is no tenderness. There is no rebound and no guarding.  Musculoskeletal: Normal range of motion. He exhibits tenderness. He exhibits no edema.  Lymphadenopathy:    He has no cervical adenopathy.  Neurological: He is alert and oriented to person, place, and time. He has normal reflexes. No cranial nerve deficit. He exhibits normal muscle tone. He displays a negative Romberg sign. Coordination abnormal. Gait normal.  Skin: Skin is warm and dry. No rash noted.  Psychiatric: He has a normal mood and affect. His behavior is normal. Judgment and thought content normal.  R shoulder is tender   Procedure :Joint Injection, R  shoulder   Indication:  Subacromial bursitis with refractory  chronic pain.   Risks including unsuccessful procedure , bleeding, infection, bruising, skin atrophy, "steroid flare-up" and others were explained to the patient in detail as well as the benefits. Informed consent was obtained and signed.   Tthe patient was placed in a comfortable position. Lateral approach was used. Skin was prepped with Betadine and alcohol  and anesthetized with a cooling spray. Then, a 5 cc syringe with a 2 inch long 24-gauge needle was used for a joint injection.. The needle was advanced  Into the subacromial space.The bursa was injected with 3 mL of 2%  lidocaine and 80 mg of Depo-Medrol .  Band-Aid was applied.   Tolerated well. Complications: None. Good pain relief following the procedure.   Postprocedure instructions :    A Band-Aid should be left on for 12 hours. Injection therapy is not a cure itself. It is used in conjunction with other modalities. You can use nonsteroidal anti-inflammatories like ibuprofen , hot and cold compresses. Rest is recommended in the next 24 hours. You need to report immediately  if fever, chills or any signs of infection develop.     Lab Results  Component Value Date   WBC 11.0 (H) 11/30/2015   HGB 11.5 (L) 11/30/2015   HCT 35.0 (L) 11/30/2015   PLT 186.0 11/30/2015   GLUCOSE 116 (H) 11/30/2015   CHOL 115 06/04/2014   TRIG 110 06/04/2014   HDL 31 (  L) 06/04/2014   LDLCALC 62 06/04/2014   ALT 18 09/28/2014   AST 22 09/28/2014   NA 141 11/30/2015   K 4.4 11/30/2015   CL 103 11/30/2015   CREATININE 1.57 (H) 11/30/2015   BUN 38 (H) 11/30/2015   CO2 28 11/30/2015   TSH 3.29 11/30/2015   PSA 0.23 12/27/2013   INR 1.49 11/21/2009   HGBA1C 6.1 12/03/2015    Dg Chest 2 View  Result Date: 06/03/2014 CLINICAL DATA:  Chest pain, weakness, body aches EXAM: CHEST  2 VIEW COMPARISON:  Prior chest x-ray 02/03/2014 FINDINGS: Very low inspiratory volumes with bibasilar subsegmental atelectasis. Cardiac and mediastinal contours are unchanged. There is mild cardiomegaly and atherosclerosis of the tortuous thoracic aorta. 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 The mid and upper lungs are clear without evidence of a pneumothorax or pleural effusion. Background bronchitic changes and interstitial prominence are similar compared to prior. No acute osseous abnormality. Incompletely imaged thoracolumbar compression fracture with changes of prior vertebroplasty. IMPRESSION: Very low inspiratory volumes with bibasilar subsegmental atelectasis. Otherwise, stable chest x-ray without evidence of  acute cardiopulmonary disease. Electronically Signed   By: Malachy Moan M.D.   On: 06/03/2014 08:49   Ct Head Wo Contrast  Result Date: 06/03/2014 CLINICAL DATA:  Headache, dysphagia, weakness EXAM: CT HEAD WITHOUT CONTRAST TECHNIQUE: Contiguous axial images were obtained from the base of the skull through the vertex without intravenous contrast. COMPARISON:  None. FINDINGS: No evidence of parenchymal hemorrhage or extra-axial fluid collection. No mass lesion, mass effect, or midline shift. No CT evidence of acute infarction. Subcortical white matter and periventricular small vessel ischemic changes. Age related atrophy.  No ventriculomegaly. The visualized paranasal sinuses are essentially clear. The mastoid air cells are unopacified. No evidence of calvarial fracture. IMPRESSION: No evidence of acute intracranial abnormality. Atrophy with small vessel ischemic changes. Electronically Signed   By: Charline Bills M.D.   On: 06/03/2014 08:29   Mri Brain Without Contrast  Result Date: 06/04/2014 CLINICAL DATA:  Altered mental status. Weakness and upper extremity pain. Slurred speech. Symptoms resolved at the time of evaluation. EXAM: MRI HEAD WITHOUT CONTRAST MRA HEAD WITHOUT CONTRAST TECHNIQUE: Multiplanar, multiecho pulse sequences of the brain and surrounding structures were obtained without intravenous contrast. Angiographic images of the head were obtained using MRA technique without contrast. COMPARISON:  CT head without contrast 06/03/2014. FINDINGS: MRI HEAD FINDINGS The diffusion-weighted images demonstrate no evidence for acute or subacute infarction. A punctate area of susceptibility in the right cerebellum is compatible with a remote hemorrhage. No acute hemorrhage or mass lesion is present. Mild atrophy and minimal white matter disease is within normal limits for age. Flow is present in the major intracranial arteries. The patient is status post bilateral lens extractions. The globes and  orbits are otherwise intact. The paranasal sinuses and mastoid air cells are clear. MRA HEAD FINDINGS The internal carotid arteries are within normal limits from the high cervical segments through the ICA termini bilaterally. The A1 and M1 segments are normal. The study is mildly degraded by patient motion. The MCA bifurcations are intact. Mild segmental irregularity is noted in the distal MCA branch vessels. The left vertebral artery is slightly dominant to the right. The PICA origins are visualized and normal. The basilar artery is within normal limits. The right posterior cerebral artery originates from the basilar tip. The left posterior cerebral artery is of fetal type. IMPRESSION: 1. No acute or focal abnormality to explain the patient's  symptoms. 2. Punctate area of remote hemorrhage in the right cerebellum is nonspecific. No discrete lesion is evident on other sequences. 3. Otherwise normal MRI of the brain for age. 4. Minimal distal small vessel disease is evident on the MRA. No significant proximal stenosis, aneurysm, or branch vessel occlusion is present. Electronically Signed   By: Gennette Pac M.D.   On: 06/04/2014 13:36   Mr Maxine Glenn Head/brain NW Cm  Result Date: 06/04/2014 CLINICAL DATA:  Altered mental status. Weakness and upper extremity pain. Slurred speech. Symptoms resolved at the time of evaluation. EXAM: MRI HEAD WITHOUT CONTRAST MRA HEAD WITHOUT CONTRAST TECHNIQUE: Multiplanar, multiecho pulse sequences of the brain and surrounding structures were obtained without intravenous contrast. Angiographic images of the head were obtained using MRA technique without contrast. COMPARISON:  CT head without contrast 06/03/2014. FINDINGS: MRI HEAD FINDINGS The diffusion-weighted images demonstrate no evidence for acute or subacute infarction. A punctate area of susceptibility in the right cerebellum is compatible with a remote hemorrhage. No acute hemorrhage or mass lesion is present. Mild atrophy and  minimal white matter disease is within normal limits for age. Flow is present in the major intracranial arteries. The patient is status post bilateral lens extractions. The globes and orbits are otherwise intact. The paranasal sinuses and mastoid air cells are clear. MRA HEAD FINDINGS The internal carotid arteries are within normal limits from the high cervical segments through the ICA termini bilaterally. The A1 and M1 segments are normal. The study is mildly degraded by patient motion. The MCA bifurcations are intact. Mild segmental irregularity is noted in the distal MCA branch vessels. The left vertebral artery is slightly dominant to the right. The PICA origins are visualized and normal. The basilar artery is within normal limits. The right posterior cerebral artery originates from the basilar tip. The left posterior cerebral artery is of fetal type. IMPRESSION: 1. No acute or focal abnormality to explain the patient's symptoms. 2. Punctate area of remote hemorrhage in the right cerebellum is nonspecific. No discrete lesion is evident on other sequences. 3. Otherwise normal MRI of the brain for age. 4. Minimal distal small vessel disease is evident on the MRA. No significant proximal stenosis, aneurysm, or branch vessel occlusion is present. Electronically Signed   By: Gennette Pac M.D.   On: 06/04/2014 13:36    Assessment & Plan:   There are no diagnoses linked to this encounter. I am having Mr. Sheetz maintain his Cholecalciferol, OCUVITE PRESERVISION, omeprazole, carvedilol, acetaminophen, finasteride, docusate sodium, lidocaine, Venlafaxine HCl, furosemide, allopurinol, aspirin EC, atorvastatin, Magnesium, colchicine, amLODipine, tobramycin, amoxicillin, predniSONE, predniSONE, and HYDROmorphone. We will continue to administer methylPREDNISolone acetate and methylPREDNISolone acetate.  No orders of the defined types were placed in this encounter.    Follow-up: No Follow-up on file.  Sonda Primes, MD

## 2016-09-17 NOTE — Assessment & Plan Note (Signed)
Coreg, ASA, Lipitor

## 2016-09-17 NOTE — Assessment & Plan Note (Signed)
On a low dose Prednisone  Potential benefits of a long term low dose steroid  use as well as potential risks  and complications were explained to the patient and were aknowledged. Dilaudid prn  Potential benefits of a long term opioids use as well as potential risks (i.e. addiction risk, apnea etc) and complications (i.e. Somnolence, constipation and others) were explained to the patient and were aknowledged.  Depo-medrol IM

## 2016-09-17 NOTE — Patient Instructions (Signed)
Postprocedure instructions :    A Band-Aid should be left on for 12 hours. Injection therapy is not a cure itself. It is used in conjunction with other modalities. You can use nonsteroidal anti-inflammatories like ibuprofen , hot and cold compresses. Rest is recommended in the next 24 hours. You need to report immediately  if fever, chills or any signs of infection develop. 

## 2016-09-17 NOTE — Progress Notes (Signed)
Pre visit review using our clinic review tool, if applicable. No additional management support is needed unless otherwise documented below in the visit note. 

## 2016-09-17 NOTE — Assessment & Plan Note (Signed)
ASA, Lipitor, Coreg

## 2016-09-17 NOTE — Assessment & Plan Note (Signed)
Depo-medrol 80 mg/d

## 2016-09-17 NOTE — Assessment & Plan Note (Signed)
Chronic  BP control; monitor labs

## 2016-09-17 NOTE — Assessment & Plan Note (Signed)
Pt has to take 10 mg/d to help sx's  Potential benefits of a long term steroid  use as well as potential risks  and complications were explained to the patient and were aknowledged.

## 2016-12-17 ENCOUNTER — Encounter: Payer: Self-pay | Admitting: Internal Medicine

## 2016-12-17 ENCOUNTER — Ambulatory Visit (INDEPENDENT_AMBULATORY_CARE_PROVIDER_SITE_OTHER): Payer: Medicare Other | Admitting: Internal Medicine

## 2016-12-17 VITALS — BP 160/90 | HR 75 | Wt 179.0 lb

## 2016-12-17 DIAGNOSIS — N320 Bladder-neck obstruction: Secondary | ICD-10-CM

## 2016-12-17 DIAGNOSIS — M25511 Pain in right shoulder: Secondary | ICD-10-CM

## 2016-12-17 DIAGNOSIS — Z0000 Encounter for general adult medical examination without abnormal findings: Secondary | ICD-10-CM

## 2016-12-17 DIAGNOSIS — M353 Polymyalgia rheumatica: Secondary | ICD-10-CM

## 2016-12-17 DIAGNOSIS — E785 Hyperlipidemia, unspecified: Secondary | ICD-10-CM

## 2016-12-17 DIAGNOSIS — M5440 Lumbago with sciatica, unspecified side: Secondary | ICD-10-CM

## 2016-12-17 DIAGNOSIS — G8929 Other chronic pain: Secondary | ICD-10-CM | POA: Diagnosis not present

## 2016-12-17 DIAGNOSIS — R7309 Other abnormal glucose: Secondary | ICD-10-CM

## 2016-12-17 DIAGNOSIS — R413 Other amnesia: Secondary | ICD-10-CM

## 2016-12-17 NOTE — Progress Notes (Signed)
Pre visit review using our clinic review tool, if applicable. No additional management support is needed unless otherwise documented below in the visit note. 

## 2016-12-18 ENCOUNTER — Encounter: Payer: Self-pay | Admitting: Internal Medicine

## 2016-12-18 ENCOUNTER — Telehealth: Payer: Self-pay | Admitting: *Deleted

## 2016-12-18 MED ORDER — HYDROMORPHONE HCL 4 MG PO TABS: 2.0000 mg | ORAL_TABLET | Freq: Four times a day (QID) | ORAL | 0 refills | Status: DC | PRN

## 2016-12-18 MED ORDER — METHYLPREDNISOLONE ACETATE 80 MG/ML IJ SUSP
80.0000 mg | Freq: Once | INTRAMUSCULAR | Status: AC
  Administered 2016-12-17: 80 mg via INTRA_ARTICULAR

## 2016-12-18 NOTE — Assessment & Plan Note (Signed)
LBP is unchanged Dilaudid  Potential benefits of a long term opioids use as well as potential risks (i.e. addiction risk, apnea etc) and complications (i.e. Somnolence, constipation and others) were explained to the patient and were aknowledged.

## 2016-12-18 NOTE — Progress Notes (Signed)
Subjective:  Patient ID: Adam Weeks., male    DOB: 1927/11/27  Age: 82 y.o. MRN: [id number]  CC: No chief complaint on file.   HPI [name]. presents for a well exam C/o severe R shoulder pain C/o LBP, fatigue  Outpatient Medications Prior to Visit  Medication Sig Dispense Refill  . acetaminophen (TYLENOL) 500 MG tablet Take 1,000 mg by mouth every 6 (six) hours as needed for mild pain.    [name] allopurinol (ZYLOPRIM) 100 MG tablet Take 1 tablet (100 mg total) by mouth daily. 30 tablet 0  . amLODipine (NORVASC) 5 MG tablet Take by mouth daily.    [name] amoxicillin (AMOXIL) 500 MG capsule take 4 capsules by mouth 1 hour BEFORE APPOINTMENT  0  . aspirin EC 325 MG tablet Take 1 tablet (325 mg total) by mouth daily. 90 tablet 0  . atorvastatin (LIPITOR) 40 MG tablet Take 1 tablet (40 mg total) by mouth daily. 30 tablet 0  . carvedilol (COREG) 12.5 MG tablet Take 12.5 mg by mouth 2 (two) times daily with a meal.    . Cholecalciferol 1000 UNITS tablet Take 1,000 Units by mouth daily.      . colchicine (COLCRYS) 0.6 MG tablet Take 1 tablet (0.6 mg total) by mouth daily. 100 tablet 3  . docusate sodium (COLACE) 100 MG capsule Take 200 mg by mouth 2 (two) times daily.    . finasteride (PROSCAR) 5 MG tablet Take 5 mg by mouth daily.    . furosemide (LASIX) 40 MG tablet Take 40 mg by mouth daily.    [name] lidocaine (LIDODERM) 5 % Place 1 patch onto the skin daily. Remove & Discard patch within 12 hours or as directed by MD    . Magnesium 200 MG TABS 1 po qd (Patient taking differently: 1 each by Per post-pyloric tube route daily. ) 100 each 3  . Multiple Vitamins-Minerals (OCUVITE PRESERVISION) TABS Take 2 tablets by mouth 2 (two) times daily.      [name] omeprazole (PRILOSEC) 20 MG capsule Take 20 mg by mouth daily.      . predniSONE (DELTASONE) 10 MG tablet Take 1 tablet (10 mg total) by mouth daily with breakfast. 90 tablet 3  . tobramycin (TOBREX) 0.3 % ophthalmic solution INSTILL 1 DROP INTO RIGHT  EYE FOUR TIMES A DAY. BEGIN 1 DAY PRIOR...  (REFER TO PRESCRIPTION NOTES).  0  . Venlafaxine HCl 225 MG TB24 Take 225 mg by mouth daily.    [name] HYDROmorphone (DILAUDID) 4 MG tablet Take 0.5-1 tablets (2-4 mg total) by mouth every 6 (six) hours as needed for severe pain. 120 tablet 0   Facility-Administered Medications Prior to Visit  Medication Dose Route Frequency Provider Last Rate Last Dose  . methylPREDNISolone acetate (DEPO-MEDROL) injection 40 mg  40 mg Intra-articular Once [name], MD      . methylPREDNISolone acetate (DEPO-MEDROL) injection 80 mg  80 mg Intra-articular Once [name], MD        ROS Review of Systems  Constitutional: Positive for fatigue. Negative for appetite change, fever and unexpected weight change.  HENT: Negative for congestion, nosebleeds, sneezing, sore throat and trouble swallowing.   Eyes: Negative for itching and visual disturbance.  Respiratory: Negative for cough.   Cardiovascular: Negative for chest pain, palpitations and leg swelling.  Gastrointestinal: Negative for abdominal distention, blood in stool, diarrhea and nausea.  Genitourinary: Negative for frequency and hematuria.  Musculoskeletal: Positive for arthralgias, back pain and  gait problem. Negative for joint swelling and neck pain.  Skin: Negative for rash.  Neurological: Negative for dizziness, tremors, speech difficulty and weakness.  Psychiatric/Behavioral: Negative for agitation, confusion, dysphoric mood, sleep disturbance and suicidal ideas. The patient is not nervous/anxious.     Objective:  BP (!) 160/90   Pulse 75   Wt 179 lb (81.2 kg)   SpO2 93%   BMI 25.32 kg/m   BP Readings from Last 3 Encounters:  12/17/16 (!) 160/90  09/17/16 140/80  03/18/16 110/60    Wt Readings from Last 3 Encounters:  12/17/16 179 lb (81.2 kg)  09/17/16 171 lb (77.6 kg)  03/18/16 170 lb (77.1 kg)    Physical Exam  Constitutional: He is oriented to person, place, and  time. He appears well-developed. No distress.  NAD  HENT:  Mouth/Throat: Oropharynx is clear and moist.  Eyes: Conjunctivae are normal. Pupils are equal, round, and reactive to light.  Neck: Normal range of motion. No JVD present. No thyromegaly present.  Cardiovascular: Normal rate, regular rhythm, normal heart sounds and intact distal pulses.  Exam reveals no gallop and no friction rub.   No murmur heard. Pulmonary/Chest: Effort normal and breath sounds normal. No respiratory distress. He has no wheezes. He has no rales. He exhibits no tenderness.  Abdominal: Soft. Bowel sounds are normal. He exhibits no distension and no mass. There is no tenderness. There is no rebound and no guarding.  Musculoskeletal: Normal range of motion. He exhibits tenderness. He exhibits no edema.  Lymphadenopathy:    He has no cervical adenopathy.  Neurological: He is alert and oriented to person, place, and time. He has normal reflexes. No cranial nerve deficit. He exhibits normal muscle tone. He displays a negative Romberg sign. Coordination and gait normal.  Skin: Skin is warm and dry. No rash noted.  Psychiatric: He has a normal mood and affect. His behavior is normal. Judgment and thought content normal.  R shoulder is very tender in subacr space and over R supraspinatus muscle   Procedure :Joint Injection,  R shoulder   Indication:  Subacromial bursitis with refractory  chronic pain.   Risks including unsuccessful procedure , bleeding, infection, bruising, skin atrophy, "steroid flare-up" and others were explained to the patient in detail as well as the benefits. Informed consent was obtained and signed.   Tthe patient was placed in a comfortable position. Lateral approach was used. Skin was prepped with Betadine and alcohol  and anesthetized with a cooling spray. Then, a 5 cc syringe with a 2 inch long 24-gauge needle was used for a joint injection.. The needle was advanced  Into the subacromial space.The  bursa was injected with 3 mL of 2% lidocaine and 70 mg of Depo-Medrol .  Band-Aid was applied.   Tolerated well. Complications: None. Good pain relief following the procedure.    Procedure Note :    Trigger Point Injection:   Indication : Focal tender area identifiable by the location without other identifiable neurologic or musculoskeletal finding or pathology.   Risks including unsuccessful procedure , bleeding, infection, bruising, skin atrophy and others were explained to the patient in detail as well as the benefits. Informed consent was obtained and signed.   Tthe patient was placed in a comfortable position.  1  point of maximum tenderness over  R infraspinatus was marked and  the skin was prepped with Betadine and alcohol. 1 inch 25-gauge needle was used. The needle was advanced perpendicular to the skin. Each trigger  point was injected with 1 mL of 2% lidocaine and 10 mg of Depo-Medrol in a usual fashion.  Band-Aids applied.   Tolerated well. Complications: None. Good pain relief following the procedure.     Lab Results  Component Value Date   WBC 11.0 (H) 11/30/2015   HGB 11.5 (L) 11/30/2015   HCT 35.0 (L) 11/30/2015   PLT 186.0 11/30/2015   GLUCOSE 128 (H) 09/17/2016   CHOL 115 06/04/2014   TRIG 110 06/04/2014   HDL 31 (L) 06/04/2014   LDLCALC 62 06/04/2014   ALT 15 09/17/2016   AST 19 09/17/2016   NA 140 09/17/2016   K 4.2 09/17/2016   CL 102 09/17/2016   CREATININE 1.68 (H) 09/17/2016   BUN 34 (H) 09/17/2016   CO2 28 09/17/2016   TSH 3.29 11/30/2015   PSA 0.23 12/27/2013   INR 1.49 11/21/2009   HGBA1C 6.1 09/17/2016    Dg Chest 2 View  Result Date: 06/03/2014 CLINICAL DATA:  Chest pain, weakness, body aches EXAM: CHEST  2 VIEW COMPARISON:  Prior chest x-ray 02/03/2014 FINDINGS: Very low inspiratory volumes with bibasilar subsegmental atelectasis. Cardiac and mediastinal contours are unchanged. There is mild cardiomegaly and atherosclerosis of the tortuous  thoracic aorta. 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 The mid and upper lungs are clear without evidence of a pneumothorax or pleural effusion. Background bronchitic changes and interstitial prominence are similar compared to prior. No acute osseous abnormality. Incompletely imaged thoracolumbar compression fracture with changes of prior vertebroplasty. IMPRESSION: Very low inspiratory volumes with bibasilar subsegmental atelectasis. Otherwise, stable chest x-ray without evidence of acute cardiopulmonary disease. Electronically Signed   By: Malachy Moan M.D.   On: 06/03/2014 08:49   Ct Head Wo Contrast  Result Date: 06/03/2014 CLINICAL DATA:  Headache, dysphagia, weakness EXAM: CT HEAD WITHOUT CONTRAST TECHNIQUE: Contiguous axial images were obtained from the base of the skull through the vertex without intravenous contrast. COMPARISON:  None. FINDINGS: No evidence of parenchymal hemorrhage or extra-axial fluid collection. No mass lesion, mass effect, or midline shift. No CT evidence of acute infarction. Subcortical white matter and periventricular small vessel ischemic changes. Age related atrophy.  No ventriculomegaly. The visualized paranasal sinuses are essentially clear. The mastoid air cells are unopacified. No evidence of calvarial fracture. IMPRESSION: No evidence of acute intracranial abnormality. Atrophy with small vessel ischemic changes. Electronically Signed   By: Charline Bills M.D.   On: 06/03/2014 08:29   Mri Brain Without Contrast  Result Date: 06/04/2014 CLINICAL DATA:  Altered mental status. Weakness and upper extremity pain. Slurred speech. Symptoms resolved at the time of evaluation. EXAM: MRI HEAD WITHOUT CONTRAST MRA HEAD WITHOUT CONTRAST TECHNIQUE: Multiplanar, multiecho pulse sequences of the brain and surrounding structures were obtained without intravenous contrast. Angiographic images of the head were obtained using MRA technique without  contrast. COMPARISON:  CT head without contrast 06/03/2014. FINDINGS: MRI HEAD FINDINGS The diffusion-weighted images demonstrate no evidence for acute or subacute infarction. A punctate area of susceptibility in the right cerebellum is compatible with a remote hemorrhage. No acute hemorrhage or mass lesion is present. Mild atrophy and minimal white matter disease is within normal limits for age. Flow is present in the major intracranial arteries. The patient is status post bilateral lens extractions. The globes and orbits are otherwise intact. The paranasal sinuses and mastoid air cells are clear. MRA HEAD FINDINGS The internal carotid arteries are within normal limits from the high cervical segments through the ICA termini  bilaterally. The A1 and M1 segments are normal. The study is mildly degraded by patient motion. The MCA bifurcations are intact. Mild segmental irregularity is noted in the distal MCA branch vessels. The left vertebral artery is slightly dominant to the right. The PICA origins are visualized and normal. The basilar artery is within normal limits. The right posterior cerebral artery originates from the basilar tip. The left posterior cerebral artery is of fetal type. IMPRESSION: 1. No acute or focal abnormality to explain the patient's symptoms. 2. Punctate area of remote hemorrhage in the right cerebellum is nonspecific. No discrete lesion is evident on other sequences. 3. Otherwise normal MRI of the brain for age. 4. Minimal distal small vessel disease is evident on the MRA. No significant proximal stenosis, aneurysm, or branch vessel occlusion is present. Electronically Signed   By: Gennette Pachris  Mattern M.D.   On: 06/04/2014 13:36   Mr Maxine GlennMra Head/brain ZOWo Cm  Result Date: 06/04/2014 CLINICAL DATA:  Altered mental status. Weakness and upper extremity pain. Slurred speech. Symptoms resolved at the time of evaluation. EXAM: MRI HEAD WITHOUT CONTRAST MRA HEAD WITHOUT CONTRAST TECHNIQUE: Multiplanar,  multiecho pulse sequences of the brain and surrounding structures were obtained without intravenous contrast. Angiographic images of the head were obtained using MRA technique without contrast. COMPARISON:  CT head without contrast 06/03/2014. FINDINGS: MRI HEAD FINDINGS The diffusion-weighted images demonstrate no evidence for acute or subacute infarction. A punctate area of susceptibility in the right cerebellum is compatible with a remote hemorrhage. No acute hemorrhage or mass lesion is present. Mild atrophy and minimal white matter disease is within normal limits for age. Flow is present in the major intracranial arteries. The patient is status post bilateral lens extractions. The globes and orbits are otherwise intact. The paranasal sinuses and mastoid air cells are clear. MRA HEAD FINDINGS The internal carotid arteries are within normal limits from the high cervical segments through the ICA termini bilaterally. The A1 and M1 segments are normal. The study is mildly degraded by patient motion. The MCA bifurcations are intact. Mild segmental irregularity is noted in the distal MCA branch vessels. The left vertebral artery is slightly dominant to the right. The PICA origins are visualized and normal. The basilar artery is within normal limits. The right posterior cerebral artery originates from the basilar tip. The left posterior cerebral artery is of fetal type. IMPRESSION: 1. No acute or focal abnormality to explain the patient's symptoms. 2. Punctate area of remote hemorrhage in the right cerebellum is nonspecific. No discrete lesion is evident on other sequences. 3. Otherwise normal MRI of the brain for age. 4. Minimal distal small vessel disease is evident on the MRA. No significant proximal stenosis, aneurysm, or branch vessel occlusion is present. Electronically Signed   By: Gennette Pachris  Mattern M.D.   On: 06/04/2014 13:36    Assessment & Plan:   Diagnoses and all orders for this visit:  Pain in joint of  right shoulder -     methylPREDNISolone acetate (DEPO-MEDROL) injection 80 mg; Inject 1 mL (80 mg total) into the articular space once.  Other orders -     Discontinue: HYDROmorphone (DILAUDID) 4 MG tablet; Take 0.5-1 tablets (2-4 mg total) by mouth every 6 (six) hours as needed for severe pain. -     Discontinue: HYDROmorphone (DILAUDID) 4 MG tablet; Take 0.5-1 tablets (2-4 mg total) by mouth every 6 (six) hours as needed for severe pain. -     HYDROmorphone (DILAUDID) 4 MG tablet; Take 0.5-1 tablets (  2-4 mg total) by mouth every 6 (six) hours as needed for severe pain.   I am having Mr. Webb SilversmithWelch maintain his Cholecalciferol, OCUVITE PRESERVISION, omeprazole, carvedilol, acetaminophen, finasteride, docusate sodium, lidocaine, Venlafaxine HCl, furosemide, allopurinol, aspirin EC, atorvastatin, Magnesium, colchicine, amLODipine, tobramycin, amoxicillin, predniSONE, and HYDROmorphone. We will continue to administer methylPREDNISolone acetate, methylPREDNISolone acetate, and methylPREDNISolone acetate.  Meds ordered this encounter  Medications  . DISCONTD: HYDROmorphone (DILAUDID) 4 MG tablet    Sig: Take 0.5-1 tablets (2-4 mg total) by mouth every 6 (six) hours as needed for severe pain.    Dispense:  120 tablet    Refill:  0    Please fill on or after 12/18/16  . methylPREDNISolone acetate (DEPO-MEDROL) injection 80 mg  . DISCONTD: HYDROmorphone (DILAUDID) 4 MG tablet    Sig: Take 0.5-1 tablets (2-4 mg total) by mouth every 6 (six) hours as needed for severe pain.    Dispense:  120 tablet    Refill:  0    Please fill on or after 01/18/17  . HYDROmorphone (DILAUDID) 4 MG tablet    Sig: Take 0.5-1 tablets (2-4 mg total) by mouth every 6 (six) hours as needed for severe pain.    Dispense:  120 tablet    Refill:  0    Please fill on or after 02/18/17     Follow-up: No Follow-up on file.  Sonda PrimesAlex Sebastien Jackson, MD

## 2016-12-18 NOTE — Telephone Encounter (Signed)
MD printed off 3 months script for pt Hydromorphone for Dec, jan, and Feb. Called pt informed his rx's are ready for pick-up...Raechel Chute/lmb

## 2016-12-18 NOTE — Assessment & Plan Note (Signed)
On steroids. °

## 2016-12-18 NOTE — Assessment & Plan Note (Signed)
Doing well 

## 2016-12-18 NOTE — Assessment & Plan Note (Signed)
See procedures

## 2016-12-18 NOTE — Assessment & Plan Note (Signed)
Here for medicare wellness/physical  Diet: heart healthy  Physical activity: sedentary  Depression/mood screen: OK on meds Hearing: decreased to whispered voice  Visual acuity: grossly normal, performs annual eye exam  ADLs: capable  Fall risk: moderate Home safety: good  Cognitive evaluation: intact to orientation, naming, recall and repetition  EOL planning: adv directives, full code/ I agree  I have personally reviewed and have noted  1. The patient's medical, surgical and social history  2. Their use of alcohol, tobacco or illicit drugs  3. Their current medications and supplements  4. The patient's functional ability including ADL's, fall risks, home safety risks and hearing or visual impairment.  5. Diet and physical activities  6. Evidence for depression or mood disorders 7. The roster of all physicians providing medical care to patient - is listed in the Snapshot section of the chart and reviewed today.    Today patient counseled on age appropriate routine health concerns for screening and prevention, each reviewed and up to date or declined. Immunizations reviewed and up to date or declined. Labs ordered and reviewed. Risk factors for depression reviewed and negative. Hearing function and visual acuity are intact. ADLs screened and addressed as needed. Functional ability and level of safety reviewed and appropriate. Education, counseling and referrals performed based on assessed risks today. Patient provided with a copy of personalized plan for preventive services.

## 2016-12-24 ENCOUNTER — Ambulatory Visit: Payer: Medicare Other | Admitting: Internal Medicine

## 2017-01-22 ENCOUNTER — Emergency Department (HOSPITAL_COMMUNITY)
Admission: EM | Admit: 2017-01-22 | Discharge: 2017-01-22 | Disposition: A | Discharge status: Home / Self Care | Payer: Medicare Other | Attending: Emergency Medicine | Admitting: Emergency Medicine

## 2017-01-22 ENCOUNTER — Encounter (HOSPITAL_COMMUNITY): Payer: Self-pay

## 2017-01-22 DIAGNOSIS — R040 Epistaxis: Secondary | ICD-10-CM | POA: Diagnosis not present

## 2017-01-22 DIAGNOSIS — I2510 Atherosclerotic heart disease of native coronary artery without angina pectoris: Secondary | ICD-10-CM | POA: Insufficient documentation

## 2017-01-22 DIAGNOSIS — Z87891 Personal history of nicotine dependence: Secondary | ICD-10-CM | POA: Insufficient documentation

## 2017-01-22 DIAGNOSIS — I129 Hypertensive chronic kidney disease with stage 1 through stage 4 chronic kidney disease, or unspecified chronic kidney disease: Secondary | ICD-10-CM | POA: Diagnosis not present

## 2017-01-22 DIAGNOSIS — Z8673 Personal history of transient ischemic attack (TIA), and cerebral infarction without residual deficits: Secondary | ICD-10-CM | POA: Insufficient documentation

## 2017-01-22 DIAGNOSIS — N183 Chronic kidney disease, stage 3 (moderate): Secondary | ICD-10-CM | POA: Diagnosis not present

## 2017-01-22 DIAGNOSIS — Z7982 Long term (current) use of aspirin: Secondary | ICD-10-CM | POA: Diagnosis not present

## 2017-01-22 DIAGNOSIS — Z79899 Other long term (current) drug therapy: Secondary | ICD-10-CM | POA: Diagnosis not present

## 2017-01-22 DIAGNOSIS — Z951 Presence of aortocoronary bypass graft: Secondary | ICD-10-CM | POA: Diagnosis not present

## 2017-01-22 LAB — CBC
HEMATOCRIT: 34.9 % — AB (ref 39.0–52.0)
Hemoglobin: 10.8 g/dL — ABNORMAL LOW (ref 13.0–17.0)
MCH: 27.8 pg (ref 26.0–34.0)
MCHC: 30.9 g/dL (ref 30.0–36.0)
MCV: 89.9 fL (ref 78.0–100.0)
Platelets: 296 10*3/uL (ref 150–400)
RBC: 3.88 MIL/uL — AB (ref 4.22–5.81)
RDW: 15 % (ref 11.5–15.5)
WBC: 8.9 10*3/uL (ref 4.0–10.5)

## 2017-01-22 MED ORDER — SILVER NITRATE-POT NITRATE 75-25 % EX MISC
1.0000 | Freq: Once | CUTANEOUS | Status: AC
  Administered 2017-01-22: 1 via TOPICAL
  Filled 2017-01-22: qty 1

## 2017-01-22 MED ORDER — SALINE SPRAY 0.65 % NA SOLN
1.0000 | Freq: Once | NASAL | Status: AC
  Administered 2017-01-22: 1 via NASAL
  Filled 2017-01-22: qty 44

## 2017-01-22 NOTE — ED Notes (Signed)
ED Provider at bedside. 

## 2017-01-22 NOTE — ED Triage Notes (Signed)
Per [facility]- Pt reside at home. Pt without HX of the same. Left nare. Denies injury. Pt present with controlled bleeding Afrin 2 sprays given. Pt without any other complaints.

## 2017-01-22 NOTE — ED Provider Notes (Signed)
[facility]-EMERGENCY DEPT Provider Note   CSN: [id number] Arrival date & time: 01/22/17  1136     History   Chief Complaint Chief Complaint  Patient presents with  . Epistaxis    HPI Adam Weeks. is a 83 y.o. male.  Pt presents to the ED with a nosebleed.  The pt said that he woke up around 0630 with his pillow and sheets saturated.  Pt said it has been dripping since then.  He was given afrin by EMS en route.  Pt does not take any blood thinners.  Afrin given by EMS.      Past Medical History:  Diagnosis Date  . CAD (coronary artery disease)   . Chronic kidney disease    stones  . GERD (gastroesophageal reflux disease)   . History of gout 09/28/2014   2014  . Hyperlipidemia   . Hypertension   . Osteoporosis     Patient Active Problem List   Diagnosis Date Noted  . Memory loss 03/15/2015  . History of gout 09/28/2014  . Involuntary movements 09/28/2014  . Orthostasis 09/28/2014  . Hypertensive renal disease with renal failure 06/16/2014  . PMR (polymyalgia rheumatica) (HCC) 06/16/2014  . CKD (chronic kidney disease) stage 3, GFR 30-59 ml/min 06/04/2014  . Elevated sed rate 06/04/2014  . TIA (transient ischemic attack) 06/03/2014  . Hypomagnesemia 06/03/2014  . Metabolic alkalosis 06/03/2014  . Dehydration 06/03/2014  . Generalized weakness 06/03/2014  . Well adult exam 12/27/2013  . Fatigue 12/27/2013  . Peripheral edema 12/09/2013  . Arthritis of right shoulder region 12/09/2013  . Right shoulder pain 12/09/2013  . Foot swelling 04/23/2013  . Anemia 04/23/2013  . Hyperuricemia 04/23/2013  . Diverticulitis 10/08/2012  . Contusion, flank 10/08/2012  . Gall stones 10/01/2012  . Constipation 09/24/2012  . CAD (coronary artery disease) 09/23/2011  . S/P AVR (aortic valve replacement) 09/23/2011  . Low back pain 09/23/2011  . Dyslipidemia 09/23/2011  . Osteopenia 09/23/2011  . Preventative health care 09/21/2011    Past Surgical History:  Procedure  Laterality Date  . CARDIAC VALVE REPLACEMENT  2009   pig valve -- AVR  . CORONARY ARTERY BYPASS GRAFT     2009  . HERNIA REPAIR         Home Medications    Prior to Admission medications   Medication Sig Start Date End Date Taking? Authorizing Provider  acetaminophen (TYLENOL) 500 MG tablet Take 1,000 mg by mouth every 6 (six) hours as needed for mild pain.    Historical Provider, MD  allopurinol (ZYLOPRIM) 100 MG tablet Take 1 tablet (100 mg total) by mouth daily. 06/05/14   Renae Fickle, MD  amLODipine (NORVASC) 5 MG tablet Take by mouth daily.    Historical Provider, MD  amoxicillin (AMOXIL) 500 MG capsule take 4 capsules by mouth 1 hour BEFORE APPOINTMENT 12/03/15   Historical Provider, MD  aspirin EC 325 MG tablet Take 1 tablet (325 mg total) by mouth daily. 06/05/14   Renae Fickle, MD  atorvastatin (LIPITOR) 40 MG tablet Take 1 tablet (40 mg total) by mouth daily. 06/05/14   Renae Fickle, MD  carvedilol (COREG) 12.5 MG tablet Take 12.5 mg by mouth 2 (two) times daily with a meal.    Historical Provider, MD  Cholecalciferol 1000 UNITS tablet Take 1,000 Units by mouth daily.      Historical Provider, MD  colchicine (COLCRYS) 0.6 MG tablet Take 1 tablet (0.6 mg total) by mouth daily. 12/14/14   Aleksei Plotnikov V,  MD  docusate sodium (COLACE) 100 MG capsule Take 200 mg by mouth 2 (two) times daily.    Historical Provider, MD  finasteride (PROSCAR) 5 MG tablet Take 5 mg by mouth daily.    Historical Provider, MD  furosemide (LASIX) 40 MG tablet Take 40 mg by mouth daily.    Historical Provider, MD  HYDROmorphone (DILAUDID) 4 MG tablet Take 0.5-1 tablets (2-4 mg total) by mouth every 6 (six) hours as needed for severe pain. 12/18/16   Aleksei Plotnikov V, MD  lidocaine (LIDODERM) 5 % Place 1 patch onto the skin daily. Remove & Discard patch within 12 hours or as directed by MD    Historical Provider, MD  Magnesium 200 MG TABS 1 po qd Patient taking differently: 1 each by Per  post-pyloric tube route daily.  10/23/14   Aleksei Plotnikov V, MD  Multiple Vitamins-Minerals (OCUVITE PRESERVISION) TABS Take 2 tablets by mouth 2 (two) times daily.      Historical Provider, MD  omeprazole (PRILOSEC) 20 MG capsule Take 20 mg by mouth daily.      Historical Provider, MD  predniSONE (DELTASONE) 10 MG tablet Take 1 tablet (10 mg total) by mouth daily with breakfast. 09/17/16   Aleksei Plotnikov V, MD  tobramycin (TOBREX) 0.3 % ophthalmic solution INSTILL 1 DROP INTO RIGHT EYE FOUR TIMES A DAY. BEGIN 1 DAY PRIOR...  (REFER TO PRESCRIPTION NOTES). 11/10/15   Historical Provider, MD  Venlafaxine HCl 225 MG TB24 Take 225 mg by mouth daily.    Historical Provider, MD    Family History Family History  Problem Relation Age of Onset  . Cancer Mother 179    breast ca  . Heart disease Father     CAD    Social History Social History  Substance Use Topics  . Smoking status: Former Games developermoker  . Smokeless tobacco: Never Used  . Alcohol use No     Allergies   Gabapentin   Review of Systems Review of Systems  HENT: Positive for nosebleeds.   All other systems reviewed and are negative.    Physical Exam Updated Vital Signs BP 140/79   Pulse 95   Temp 97.3 F (36.3 C) (Oral)   Resp 15   SpO2 97%   Physical Exam  Constitutional: He is oriented to person, place, and time. He appears well-developed and well-nourished.  HENT:  Head: Normocephalic and atraumatic.  Right Ear: External ear normal.  Left Ear: External ear normal.  Nose: Epistaxis is observed.  Mouth/Throat: Oropharyngeal exudate present.  Eyes: EOM are normal. Pupils are equal, round, and reactive to light.  Neck: Normal range of motion. Neck supple.  Cardiovascular: Normal rate, regular rhythm, normal heart sounds and intact distal pulses.   Pulmonary/Chest: Effort normal and breath sounds normal.  Abdominal: Soft. Bowel sounds are normal.  Musculoskeletal: Normal range of motion.  Neurological: He is  alert and oriented to person, place, and time.  Skin: Skin is warm.  Psychiatric: He has a normal mood and affect. His behavior is normal. Judgment and thought content normal.  Nursing note and vitals reviewed.    ED Treatments / Results  Labs (all labs ordered are listed, but only abnormal results are displayed) Labs Reviewed  CBC - Abnormal; Notable for the following:       Result Value   RBC 3.88 (*)    Hemoglobin 10.8 (*)    HCT 34.9 (*)    All other components within normal limits    EKG  EKG Interpretation None       Radiology No results found.  Procedures .Epistaxis Management Date/Time: 01/22/2017 12:03 PM Performed by: Jacalyn Lefevre Authorized by: Jacalyn Lefevre   Consent:    Consent obtained:  Verbal   Consent given by:  Patient   Risks discussed:  Bleeding and pain   Alternatives discussed:  No treatment Anesthesia (see MAR for exact dosages):    Anesthesia method:  None Procedure details:    Treatment site:  L anterior   Treatment method:  Silver nitrate   Treatment complexity:  Limited   Treatment episode: initial   Post-procedure details:    Assessment:  Bleeding stopped   Patient tolerance of procedure:  Tolerated well, no immediate complications   (including critical care time)  Medications Ordered in ED Medications  sodium chloride (OCEAN) 0.65 % nasal spray 1 spray (not administered)  silver nitrate applicators applicator 1 Stick (1 Stick Topical Given 01/22/17 1158)     Initial Impression / Assessment and Plan / ED Course  I have reviewed the triage vital signs and the nursing notes.  Pertinent labs & imaging results that were available during my care of the patient were reviewed by me and considered in my medical decision making (see chart for details).  Pt has had no further bleeding.  He will be d/c home with saline nasal spray.  He knows to return if worse.  Final Clinical Impressions(s) / ED Diagnoses   Final diagnoses:    Epistaxis    New Prescriptions New Prescriptions   No medications on file     Jacalyn Lefevre, MD 01/22/17 1257

## 2017-01-22 NOTE — ED Notes (Signed)
Bed: WA09 Expected date:  Expected time:  Means of arrival:  Comments: EMS elderly nosebleed

## 2017-01-23 ENCOUNTER — Encounter: Payer: Self-pay | Admitting: Internal Medicine

## 2017-01-23 ENCOUNTER — Ambulatory Visit (INDEPENDENT_AMBULATORY_CARE_PROVIDER_SITE_OTHER): Payer: Medicare Other | Admitting: Internal Medicine

## 2017-01-23 ENCOUNTER — Ambulatory Visit (INDEPENDENT_AMBULATORY_CARE_PROVIDER_SITE_OTHER)
Admission: RE | Admit: 2017-01-23 | Discharge: 2017-01-23 | Disposition: A | Discharge status: Home / Self Care | Payer: Medicare Other | Source: Ambulatory Visit | Attending: Internal Medicine | Admitting: Internal Medicine

## 2017-01-23 DIAGNOSIS — R0602 Shortness of breath: Secondary | ICD-10-CM

## 2017-01-23 DIAGNOSIS — R0600 Dyspnea, unspecified: Secondary | ICD-10-CM | POA: Insufficient documentation

## 2017-01-23 DIAGNOSIS — M353 Polymyalgia rheumatica: Secondary | ICD-10-CM

## 2017-01-23 DIAGNOSIS — R5382 Chronic fatigue, unspecified: Secondary | ICD-10-CM | POA: Diagnosis not present

## 2017-01-23 MED ORDER — METHYLPREDNISOLONE ACETATE 80 MG/ML IJ SUSP
80.0000 mg | Freq: Once | INTRAMUSCULAR | Status: AC
  Administered 2017-01-23: 80 mg via INTRAMUSCULAR

## 2017-01-23 NOTE — Patient Instructions (Signed)
Go to ER if feeling worse.

## 2017-01-23 NOTE — Assessment & Plan Note (Signed)
Depo-medrol IM 

## 2017-01-23 NOTE — Progress Notes (Signed)
Subjective:  Patient ID: Adam BoozeLeo A Havrilla Jr., male    DOB: July 24, 1927  Age: 83 y.o. MRN: [id number]  CC: No chief complaint on file.   HPI Adam BoozeLeo A Rodenberg Jr. presents for a nose bleed yesterday. C/o weakness today.  C/o PMR  Outpatient Medications Prior to Visit  Medication Sig Dispense Refill  . acetaminophen (TYLENOL) 500 MG tablet Take 1,000 mg by mouth every 6 (six) hours as needed for mild pain.    [name]. allopurinol (ZYLOPRIM) 100 MG tablet Take 1 tablet (100 mg total) by mouth daily. 30 tablet 0  . amLODipine (NORVASC) 5 MG tablet Take by mouth daily.    [name]. amoxicillin (AMOXIL) 500 MG capsule take 4 capsules by mouth 1 hour BEFORE APPOINTMENT  0  . aspirin EC 325 MG tablet Take 1 tablet (325 mg total) by mouth daily. 90 tablet 0  . atorvastatin (LIPITOR) 40 MG tablet Take 1 tablet (40 mg total) by mouth daily. 30 tablet 0  . carvedilol (COREG) 12.5 MG tablet Take 12.5 mg by mouth 2 (two) times daily with a meal.    . Cholecalciferol 1000 UNITS tablet Take 1,000 Units by mouth daily.      . colchicine (COLCRYS) 0.6 MG tablet Take 1 tablet (0.6 mg total) by mouth daily. 100 tablet 3  . docusate sodium (COLACE) 100 MG capsule Take 200 mg by mouth 2 (two) times daily.    . finasteride (PROSCAR) 5 MG tablet Take 5 mg by mouth daily.    . furosemide (LASIX) 40 MG tablet Take 40 mg by mouth daily.    [name]. HYDROmorphone (DILAUDID) 4 MG tablet Take 0.5-1 tablets (2-4 mg total) by mouth every 6 (six) hours as needed for severe pain. 120 tablet 0  . lidocaine (LIDODERM) 5 % Place 1 patch onto the skin daily. Remove & Discard patch within 12 hours or as directed by MD    . Magnesium 200 MG TABS 1 po qd (Patient taking differently: 1 each by Per post-pyloric tube route daily. ) 100 each 3  . Multiple Vitamins-Minerals (OCUVITE PRESERVISION) TABS Take 2 tablets by mouth 2 (two) times daily.      [name]. omeprazole (PRILOSEC) 20 MG capsule Take 20 mg by mouth daily.      . predniSONE (DELTASONE) 10 MG tablet Take 1  tablet (10 mg total) by mouth daily with breakfast. 90 tablet 3  . tobramycin (TOBREX) 0.3 % ophthalmic solution INSTILL 1 DROP INTO RIGHT EYE FOUR TIMES A DAY. BEGIN 1 DAY PRIOR...  (REFER TO PRESCRIPTION NOTES).  0  . Venlafaxine HCl 225 MG TB24 Take 225 mg by mouth daily.     Facility-Administered Medications Prior to Visit  Medication Dose Route Frequency Provider Last Rate Last Dose  . methylPREDNISolone acetate (DEPO-MEDROL) injection 40 mg  40 mg Intra-articular Once Trevell Pariseau V, MD      . methylPREDNISolone acetate (DEPO-MEDROL) injection 80 mg  80 mg Intra-articular Once Hulet Ehrmann V, MD        ROS Review of Systems  Constitutional: Positive for fatigue. Negative for appetite change and unexpected weight change.  HENT: Negative for congestion, nosebleeds, sneezing, sore throat and trouble swallowing.   Eyes: Negative for itching and visual disturbance.  Respiratory: Negative for cough.   Cardiovascular: Negative for chest pain, palpitations and leg swelling.  Gastrointestinal: Negative for abdominal distention, blood in stool, diarrhea and nausea.  Genitourinary: Negative for frequency and hematuria.  Musculoskeletal: Positive for back pain and gait problem. Negative  for joint swelling and neck pain.  Skin: Negative for rash.  Neurological: Positive for weakness. Negative for dizziness, tremors and speech difficulty.  Psychiatric/Behavioral: Negative for agitation, dysphoric mood and sleep disturbance. The patient is not nervous/anxious.     Objective:  BP 120/80   Pulse 81   Wt 169 lb (76.7 kg)   SpO2 98%   BMI 23.91 kg/m   BP Readings from Last 3 Encounters:  01/23/17 120/80  01/22/17 149/76  12/17/16 (!) 160/90    Wt Readings from Last 3 Encounters:  01/23/17 169 lb (76.7 kg)  12/17/16 179 lb (81.2 kg)  09/17/16 171 lb (77.6 kg)    Physical Exam  Constitutional: He is oriented to person, place, and time. He appears well-developed. No distress.    NAD  HENT:  Mouth/Throat: Oropharynx is clear and moist.  Eyes: Conjunctivae are normal. Pupils are equal, round, and reactive to light.  Neck: Normal range of motion. No JVD present. No thyromegaly present.  Cardiovascular: Normal rate, regular rhythm, normal heart sounds and intact distal pulses.  Exam reveals no gallop and no friction rub.   No murmur heard. Pulmonary/Chest: Effort normal and breath sounds normal. No respiratory distress. He has no wheezes. He has no rales. He exhibits no tenderness.  Abdominal: Soft. Bowel sounds are normal. He exhibits no distension and no mass. There is no tenderness. There is no rebound and no guarding.  Musculoskeletal: Normal range of motion. He exhibits tenderness. He exhibits no edema.  Lymphadenopathy:    He has no cervical adenopathy.  Neurological: He is alert and oriented to person, place, and time. He has normal reflexes. No cranial nerve deficit. He exhibits normal muscle tone. He displays a negative Romberg sign. Coordination abnormal. Gait normal.  Skin: Skin is warm and dry. No rash noted.  Psychiatric: He has a normal mood and affect. His behavior is normal. Judgment and thought content normal.  LS tender Walker  Lab Results  Component Value Date   WBC 8.9 01/22/2017   HGB 10.8 (L) 01/22/2017   HCT 34.9 (L) 01/22/2017   PLT 296 01/22/2017   GLUCOSE 128 (H) 09/17/2016   CHOL 115 06/04/2014   TRIG 110 06/04/2014   HDL 31 (L) 06/04/2014   LDLCALC 62 06/04/2014   ALT 15 09/17/2016   AST 19 09/17/2016   NA 140 09/17/2016   K 4.2 09/17/2016   CL 102 09/17/2016   CREATININE 1.68 (H) 09/17/2016   BUN 34 (H) 09/17/2016   CO2 28 09/17/2016   TSH 3.29 11/30/2015   PSA 0.23 12/27/2013   INR 1.49 11/21/2009   HGBA1C 6.1 09/17/2016    No results found.  Assessment & Plan:   There are no diagnoses linked to this encounter. I am having [name] maintain his Cholecalciferol, OCUVITE PRESERVISION, omeprazole, carvedilol,  acetaminophen, finasteride, docusate sodium, lidocaine, Venlafaxine HCl, furosemide, allopurinol, aspirin EC, atorvastatin, Magnesium, colchicine, amLODipine, tobramycin, amoxicillin, predniSONE, and HYDROmorphone. We will continue to administer methylPREDNISolone acetate and methylPREDNISolone acetate.  No orders of the defined types were placed in this encounter.    Follow-up: No Follow-up on file.  Sonda Primes, MD

## 2017-01-23 NOTE — Assessment & Plan Note (Signed)
Labs in 1 week 

## 2017-01-23 NOTE — Progress Notes (Signed)
Pre visit review using our clinic review tool, if applicable. No additional management support is needed unless otherwise documented below in the visit note. 

## 2017-01-23 NOTE — Assessment & Plan Note (Signed)
CXR Go to ER if worse

## 2017-01-27 ENCOUNTER — Telehealth: Payer: Self-pay | Admitting: Internal Medicine

## 2017-01-27 NOTE — Telephone Encounter (Signed)
Pt's daughter in law called requesting a letter. She states pt has fallen down not too long ago taking the trash container to the road. She spoke with the city and if you can write a letter stating pt is unable to bring the can to the road with a reason why, the city will take care of this for him.  If so, please mail the letter to the patient and give pt a call to let him know.

## 2017-01-28 NOTE — Telephone Encounter (Signed)
pls write a letter Thx 

## 2017-01-29 ENCOUNTER — Encounter: Payer: Self-pay | Admitting: Geriatric Medicine

## 2017-01-29 NOTE — Telephone Encounter (Signed)
Letter printed and mailed to patient. Called patient to inform.

## 2017-04-13 ENCOUNTER — Ambulatory Visit (INDEPENDENT_AMBULATORY_CARE_PROVIDER_SITE_OTHER)
Admission: RE | Admit: 2017-04-13 | Discharge: 2017-04-13 | Disposition: A | Discharge status: Home / Self Care | Payer: Medicare Other | Source: Ambulatory Visit | Attending: Internal Medicine | Admitting: Internal Medicine

## 2017-04-13 ENCOUNTER — Ambulatory Visit (INDEPENDENT_AMBULATORY_CARE_PROVIDER_SITE_OTHER): Payer: Medicare Other | Admitting: Internal Medicine

## 2017-04-13 ENCOUNTER — Other Ambulatory Visit (INDEPENDENT_AMBULATORY_CARE_PROVIDER_SITE_OTHER): Payer: Medicare Other

## 2017-04-13 ENCOUNTER — Encounter: Payer: Self-pay | Admitting: Internal Medicine

## 2017-04-13 VITALS — BP 160/80 | HR 78 | Temp 98.0°F | Resp 16 | Ht 70.5 in | Wt 169.2 lb

## 2017-04-13 DIAGNOSIS — M7989 Other specified soft tissue disorders: Secondary | ICD-10-CM | POA: Diagnosis not present

## 2017-04-13 DIAGNOSIS — M79662 Pain in left lower leg: Secondary | ICD-10-CM

## 2017-04-13 DIAGNOSIS — E785 Hyperlipidemia, unspecified: Secondary | ICD-10-CM | POA: Diagnosis not present

## 2017-04-13 DIAGNOSIS — M353 Polymyalgia rheumatica: Secondary | ICD-10-CM | POA: Diagnosis not present

## 2017-04-13 DIAGNOSIS — R0600 Dyspnea, unspecified: Secondary | ICD-10-CM

## 2017-04-13 DIAGNOSIS — R0789 Other chest pain: Secondary | ICD-10-CM | POA: Diagnosis not present

## 2017-04-13 DIAGNOSIS — R7309 Other abnormal glucose: Secondary | ICD-10-CM | POA: Diagnosis not present

## 2017-04-13 DIAGNOSIS — R7989 Other specified abnormal findings of blood chemistry: Secondary | ICD-10-CM

## 2017-04-13 DIAGNOSIS — I700 Atherosclerosis of aorta: Secondary | ICD-10-CM | POA: Diagnosis not present

## 2017-04-13 DIAGNOSIS — Z0000 Encounter for general adult medical examination without abnormal findings: Secondary | ICD-10-CM | POA: Diagnosis not present

## 2017-04-13 DIAGNOSIS — R079 Chest pain, unspecified: Secondary | ICD-10-CM | POA: Diagnosis not present

## 2017-04-13 LAB — HEMOGLOBIN A1C: Hgb A1c MFr Bld: 6.2 % (ref 4.6–6.5)

## 2017-04-13 LAB — LIPID PANEL
CHOL/HDL RATIO: 5
Cholesterol: 238 mg/dL — ABNORMAL HIGH (ref 0–200)
HDL: 46.2 mg/dL (ref >39.00)
NONHDL: 192.25
TRIGLYCERIDES: 207 mg/dL — AB (ref 0.0–149.0)
VLDL: 41.4 mg/dL — AB (ref 0.0–40.0)

## 2017-04-13 LAB — COMPREHENSIVE METABOLIC PANEL
ALK PHOS: 62 U/L (ref 39–117)
ALT: 15 U/L (ref 0–53)
AST: 16 U/L (ref 0–37)
Albumin: 4 g/dL (ref 3.5–5.2)
BUN: 44 mg/dL — AB (ref 6–23)
CO2: 29 mEq/L (ref 19–32)
Calcium: 10.3 mg/dL (ref 8.4–10.5)
Chloride: 104 mEq/L (ref 96–112)
Creatinine, Ser: 1.71 mg/dL — ABNORMAL HIGH (ref 0.40–1.50)
GFR: 40.39 mL/min — ABNORMAL LOW (ref >60.00)
Glucose, Bld: 117 mg/dL — ABNORMAL HIGH (ref 70–99)
POTASSIUM: 5.4 meq/L — AB (ref 3.5–5.1)
SODIUM: 143 meq/L (ref 135–145)
Total Bilirubin: 0.3 mg/dL (ref 0.2–1.2)
Total Protein: 7.5 g/dL (ref 6.0–8.3)

## 2017-04-13 LAB — CBC WITH DIFFERENTIAL/PLATELET
BASOS ABS: 0 10*3/uL (ref 0.0–0.1)
Basophils Relative: 0.3 % (ref 0.0–3.0)
EOS ABS: 0.2 10*3/uL (ref 0.0–0.7)
EOS PCT: 1.5 % (ref 0.0–5.0)
HCT: 39 % (ref 39.0–52.0)
HEMOGLOBIN: 12.6 g/dL — AB (ref 13.0–17.0)
Lymphocytes Relative: 13 % (ref 12.0–46.0)
Lymphs Abs: 1.9 10*3/uL (ref 0.7–4.0)
MCHC: 32.3 g/dL (ref 30.0–36.0)
MCV: 98 fl (ref 78.0–100.0)
MONO ABS: 0.8 10*3/uL (ref 0.1–1.0)
Monocytes Relative: 5.8 % (ref 3.0–12.0)
Neutro Abs: 11.6 10*3/uL — ABNORMAL HIGH (ref 1.4–7.7)
Neutrophils Relative %: 79.4 % — ABNORMAL HIGH (ref 43.0–77.0)
Platelets: 266 10*3/uL (ref 150.0–400.0)
RBC: 3.98 Mil/uL — ABNORMAL LOW (ref 4.22–5.81)
RDW: 13.7 % (ref 11.5–15.5)
WBC: 14.6 10*3/uL — ABNORMAL HIGH (ref 4.0–10.5)

## 2017-04-13 LAB — LDL CHOLESTEROL, DIRECT: LDL DIRECT: 129 mg/dL

## 2017-04-13 LAB — BRAIN NATRIURETIC PEPTIDE: PRO B NATRI PEPTIDE: 399 pg/mL — AB (ref 0.0–100.0)

## 2017-04-13 MED ORDER — RIVAROXABAN 15 MG PO TABS: 15.0000 mg | ORAL_TABLET | Freq: Every day | ORAL | 0 refills | Status: DC

## 2017-04-13 NOTE — Progress Notes (Signed)
Pre visit review using our clinic review tool, if applicable. No additional management support is needed unless otherwise documented below in the visit note. 

## 2017-04-13 NOTE — Progress Notes (Signed)
Subjective:  Patient ID: Adam Booze., male    DOB: [birth year]  Age: 83 y.o. MRN: [id number]  CC: Shortness of Breath  NEW TO ME  HPI Adam Bley. presents for a 3 week history of left lower extremity swelling and discomfort with mild shortness of breath, chronic low back pain, and a 3 week history of right posterior rib cage pain. He has ecchymosis on both lower extremities which he says he's had off and on for over a year. He takes an aspirin a day. He sustained a fall several months ago but he denies any recent trauma or injury.  Outpatient Medications Prior to Visit  Medication Sig Dispense Refill  . acetaminophen (TYLENOL) 500 MG tablet Take 1,000 mg by mouth every 6 (six) hours as needed for mild pain.    [name] allopurinol (ZYLOPRIM) 100 MG tablet Take 1 tablet (100 mg total) by mouth daily. 30 tablet 0  . amLODipine (NORVASC) 5 MG tablet Take by mouth daily.    [name] amoxicillin (AMOXIL) 500 MG capsule take 4 capsules by mouth 1 hour BEFORE APPOINTMENT  0  . aspirin EC 325 MG tablet Take 1 tablet (325 mg total) by mouth daily. 90 tablet 0  . atorvastatin (LIPITOR) 40 MG tablet Take 1 tablet (40 mg total) by mouth daily. 30 tablet 0  . carvedilol (COREG) 12.5 MG tablet Take 12.5 mg by mouth 2 (two) times daily with a meal.    . Cholecalciferol 1000 UNITS tablet Take 1,000 Units by mouth daily.      . colchicine (COLCRYS) 0.6 MG tablet Take 1 tablet (0.6 mg total) by mouth daily. 100 tablet 3  . docusate sodium (COLACE) 100 MG capsule Take 200 mg by mouth 2 (two) times daily.    . finasteride (PROSCAR) 5 MG tablet Take 5 mg by mouth daily.    . furosemide (LASIX) 40 MG tablet Take 40 mg by mouth daily.    [name] HYDROmorphone (DILAUDID) 4 MG tablet Take 0.5-1 tablets (2-4 mg total) by mouth every 6 (six) hours as needed for severe pain. 120 tablet 0  . lidocaine (LIDODERM) 5 % Place 1 patch onto the skin daily. Remove & Discard patch within 12 hours or as directed by MD    . Magnesium 200  MG TABS 1 po qd (Patient taking differently: 1 each by Per post-pyloric tube route daily. ) 100 each 3  . Multiple Vitamins-Minerals (OCUVITE PRESERVISION) TABS Take 2 tablets by mouth 2 (two) times daily.      [name] omeprazole (PRILOSEC) 20 MG capsule Take 20 mg by mouth daily.      . predniSONE (DELTASONE) 10 MG tablet Take 1 tablet (10 mg total) by mouth daily with breakfast. 90 tablet 3  . tobramycin (TOBREX) 0.3 % ophthalmic solution INSTILL 1 DROP INTO RIGHT EYE FOUR TIMES A DAY. BEGIN 1 DAY PRIOR...  (REFER TO PRESCRIPTION NOTES).  0  . Venlafaxine HCl 225 MG TB24 Take 225 mg by mouth daily.     Facility-Administered Medications Prior to Visit  Medication Dose Route Frequency Provider Last Rate Last Dose  . methylPREDNISolone acetate (DEPO-MEDROL) injection 40 mg  40 mg Intra-articular Once Aleksei Plotnikov V, MD      . methylPREDNISolone acetate (DEPO-MEDROL) injection 80 mg  80 mg Intra-articular Once Aleksei Plotnikov V, MD        ROS Review of Systems  Constitutional: Positive for fatigue. Negative for activity change, appetite change, chills, diaphoresis, fever and unexpected weight  change.  HENT: Negative.   Eyes: Negative for visual disturbance.  Respiratory: Positive for shortness of breath. Negative for cough, chest tightness, wheezing and stridor.   Cardiovascular: Positive for chest pain and leg swelling. Negative for palpitations.  Gastrointestinal: Negative for abdominal pain, constipation, diarrhea, nausea and vomiting.  Endocrine: Negative.   Genitourinary: Negative.  Negative for decreased urine volume, difficulty urinating, flank pain, frequency and urgency.  Musculoskeletal: Positive for back pain. Negative for myalgias.  Skin: Negative for color change and pallor.  Allergic/Immunologic: Negative.   Neurological: Negative.  Negative for dizziness, weakness, light-headedness and headaches.  Hematological: Negative for adenopathy. Bruises/bleeds easily.    Psychiatric/Behavioral: Negative.     Objective:  BP (!) 160/80 (BP Location: Left Arm, Patient Position: Sitting, Cuff Size: Normal)   Pulse 78   Temp 98 F (36.7 C) (Oral)   Resp 16   Ht 5' 10.5" (1.791 m)   Wt 169 lb 4 oz (76.8 kg)   SpO2 96%   BMI 23.94 kg/m   BP Readings from Last 3 Encounters:  04/13/17 (!) 160/80  01/23/17 120/80  01/22/17 149/76    Wt Readings from Last 3 Encounters:  04/13/17 169 lb 4 oz (76.8 kg)  01/23/17 169 lb (76.7 kg)  12/17/16 179 lb (81.2 kg)    Physical Exam  Constitutional: He is oriented to person, place, and time.  Non-toxic appearance. He does not have a sickly appearance. He does not appear ill. No distress.  HENT:  Mouth/Throat: Oropharynx is clear and moist. No oropharyngeal exudate.  Eyes: Conjunctivae are normal. Right eye exhibits no discharge. Left eye exhibits no discharge. No scleral icterus.  Neck: Normal range of motion. Neck supple. No JVD present. No tracheal deviation present. No thyromegaly present.  Cardiovascular: Normal rate, regular rhythm and intact distal pulses.  Exam reveals no gallop and no friction rub.   Murmur heard.  Systolic murmur is present with a grade of 1/6   No diastolic murmur is present  + click in systole  Pulmonary/Chest: Effort normal. No stridor. No respiratory distress. He has no decreased breath sounds. He has no wheezes. He has no rhonchi. He has rales in the right lower field and the left lower field. He exhibits no tenderness.  Abdominal: Soft. Bowel sounds are normal. He exhibits no distension and no mass. There is no tenderness. There is no rebound and no guarding.  Musculoskeletal: Normal range of motion. He exhibits edema. He exhibits no tenderness or deformity.       Lumbar back: Normal. He exhibits normal range of motion, no tenderness, no bony tenderness, no swelling, no edema, no deformity and no laceration.       Back:  He has a symmetrical, 2+ pitting edema in the left lower  extremity with mild tenderness to palpation in his calf but there is no evidence of a Baker's cyst  Neurological: He is oriented to person, place, and time.  Skin: Skin is warm and dry. No rash noted. He is not diaphoretic. No erythema. No pallor.  Vitals reviewed.   Lab Results  Component Value Date   WBC 14.6 (H) 04/13/2017   HGB 12.6 (L) 04/13/2017   HCT 39.0 04/13/2017   PLT 266.0 Platelet estimate appears normal. 04/13/2017   GLUCOSE 117 (H) 04/13/2017   CHOL 238 (H) 04/13/2017   TRIG 207.0 (H) 04/13/2017   HDL 46.20 04/13/2017   LDLDIRECT 129.0 04/13/2017   LDLCALC 62 06/04/2014   ALT 15 04/13/2017   AST 16  04/13/2017   NA 143 04/13/2017   K 5.4 (H) 04/13/2017   CL 104 04/13/2017   CREATININE 1.71 (H) 04/13/2017   BUN 44 (H) 04/13/2017   CO2 29 04/13/2017   TSH 3.29 11/30/2015   PSA 0.23 12/27/2013   INR 1.49 11/21/2009   HGBA1C 6.2 04/13/2017    Dg Chest 2 View  Result Date: 01/23/2017 CLINICAL DATA:  Shortness of breath and recent nosebleeds EXAM: CHEST  2 VIEW COMPARISON:  06/03/2014 FINDINGS: Cardiac shadow is within normal limits. Aortic calcifications are again seen. Postsurgical changes are again noted. The lungs are well aerated without focal infiltrate. Prior vertebral augmentation is again identified and stable. IMPRESSION: No acute abnormality noted. Electronically Signed   By: Alcide Clever M.D.   On: 01/23/2017 14:34    Assessment & Plan:   Adam Weeks was seen today for shortness of breath.  Diagnoses and all orders for this visit:  Pain and swelling of lower leg, left- he has a mildly elevated d-dimer and renal insufficiency so we'll start anticoagulation with low doses Xarelto and have ordered a stat ultrasound of the right lower extremity to see if indeed there is a deep venous thrombosis. If there is no DVT that I'll ask him to stop taking Xarelto. -     Comprehensive metabolic panel; Future -     CBC with Differential/Platelet; Future -     D-dimer,  quantitative (not at [facility]); Future -     Rivaroxaban (XARELTO) 15 MG TABS tablet; Take 1 tablet (15 mg total) by mouth daily with supper. -     VAS [place] LOWER EXTREMITY VENOUS (DVT); Future  Right-sided chest wall pain- x-ray shows a thoracic vertebral fracture that is old but appears to be collapsing over time I think this explains his pain. -     DG Chest 2 View; Future  Dyspnea, unspecified type- he has a slightly elevated d-dimer as well as a slightly elevated BNP, will start diuresis with a loop diuretic and will start anticoagulation with Xarelto while the workup for DVT and possibly PE continues. -     CBC with Differential/Platelet; Future -     D-dimer, quantitative (not at [facility]); Future -     Brain natriuretic peptide; Future -     DG Chest 2 View; Future   I am having Adam Weeks start on Rivaroxaban. I am also having him maintain his Cholecalciferol, OCUVITE PRESERVISION, omeprazole, carvedilol, acetaminophen, finasteride, docusate sodium, lidocaine, Venlafaxine HCl, furosemide, allopurinol, aspirin EC, atorvastatin, Magnesium, colchicine, amLODipine, tobramycin, amoxicillin, predniSONE, and HYDROmorphone. We will continue to administer methylPREDNISolone acetate and methylPREDNISolone acetate.  Meds ordered this encounter  Medications  . Rivaroxaban (XARELTO) 15 MG TABS tablet    Sig: Take 1 tablet (15 mg total) by mouth daily with supper.    Dispense:  10 tablet    Refill:  0     Follow-up: Return in about 1 week (around 04/20/2017).  Sanda Linger, MD

## 2017-04-13 NOTE — Patient Instructions (Signed)
Shortness of Breath, Adult °Shortness of breath is when a person has trouble breathing enough air, or when a person feels like she or he is having trouble breathing in enough air. Shortness of breath could be a sign of medical problem. °Follow these instructions at home: °Pay attention to any changes in your symptoms. Take these actions to help with your condition: °· Do not smoke. Smoking is a common cause of shortness of breath. If you smoke and you need help quitting, ask your health care provider. °· Avoid things that can irritate your airways, such as: °¨ Mold. °¨ Dust. °¨ Air pollution. °¨ Chemical fumes. °¨ Things that can cause allergy symptoms (allergens), if you have allergies. °· Keep your living space clean and free of mold and dust. °· Rest as needed. Slowly return to your usual activities. °· Take over-the-counter and prescription medicines, including oxygen and inhaled medicines, only as told by your health care provider. °· Keep all follow-up visits as told by your health care provider. This is important. °Contact a health care provider if: °· Your condition does not improve as soon as expected. °· You have a hard time doing your normal activities, even after you rest. °· You have new symptoms. °Get help right away if: °· Your shortness of breath gets worse. °· You have shortness of breath when you are resting. °· You feel light-headed or you faint. °· You have a cough that is not controlled with medicines. °· You cough up blood. °· You have pain with breathing. °· You have pain in your chest, arms, shoulders, or abdomen. °· You have a fever. °· You cannot walk up stairs or exercise the way that you normally do. °This information is not intended to replace advice given to you by your health care provider. Make sure you discuss any questions you have with your health care provider. °Document Released: 09/09/2001 Document Revised: 07/05/2016 Document Reviewed: 05/22/2016 °Elsevier Interactive Patient  Education © 2017 [employer] ° °

## 2017-04-14 ENCOUNTER — Telehealth: Payer: Self-pay | Admitting: Internal Medicine

## 2017-04-14 ENCOUNTER — Ambulatory Visit (HOSPITAL_COMMUNITY)
Admission: RE | Admit: 2017-04-14 | Discharge: 2017-04-14 | Disposition: A | Discharge status: Home / Self Care | Payer: Medicare Other | Source: Ambulatory Visit | Attending: Vascular Surgery | Admitting: Vascular Surgery

## 2017-04-14 ENCOUNTER — Ambulatory Visit: Payer: Medicare Other | Admitting: Internal Medicine

## 2017-04-14 DIAGNOSIS — M7989 Other specified soft tissue disorders: Secondary | ICD-10-CM | POA: Insufficient documentation

## 2017-04-14 DIAGNOSIS — M79662 Pain in left lower leg: Secondary | ICD-10-CM

## 2017-04-14 LAB — D-DIMER, QUANTITATIVE (NOT AT ARMC): D DIMER QUANT: 1.96 ug{FEU}/mL — AB (ref <0.50)

## 2017-04-14 MED ORDER — TORSEMIDE 10 MG PO TABS: 10.0000 mg | ORAL_TABLET | Freq: Every day | ORAL | 0 refills | Status: DC

## 2017-04-14 NOTE — Telephone Encounter (Signed)
Call stating there was an order put in for ASAP, they offered the patient there only appt and the Pt cannot come at that time Do you want to refer to somewhere else?

## 2017-04-14 NOTE — Telephone Encounter (Signed)
Question about torsemide vs furosemide? Should pt take both or only one.

## 2017-04-14 NOTE — Telephone Encounter (Signed)
Stop furosemide and start torsemide. 

## 2017-04-14 NOTE — Telephone Encounter (Signed)
Vein and Vascular Report Negative for DDT Does has interstitial fluid in calf

## 2017-04-14 NOTE — Telephone Encounter (Signed)
Pt made the appt for today. Closing note.

## 2017-04-14 NOTE — Telephone Encounter (Signed)
Please resend to [facility] on [address] in [place].  Pt does not use [facility] anymore  Venlafaxine HCl 225 MG  torsemide (DEMADEX) 10 MG

## 2017-04-16 NOTE — Telephone Encounter (Signed)
Noted. Thx.

## 2017-04-16 NOTE — Telephone Encounter (Signed)
Pt called back, Please call back [phone]

## 2017-04-16 NOTE — Telephone Encounter (Signed)
Called pt and pt companion back and informed both of all results. Follow up appt will be made. Mailed results as requested.

## 2017-04-16 NOTE — Telephone Encounter (Signed)
Routing to dr plotnikov, fyi.... 

## 2017-04-16 NOTE — Telephone Encounter (Signed)
LVM for pt to call back as soon as possible.   

## 2017-04-24 ENCOUNTER — Ambulatory Visit (INDEPENDENT_AMBULATORY_CARE_PROVIDER_SITE_OTHER): Payer: Medicare Other | Admitting: Internal Medicine

## 2017-04-24 ENCOUNTER — Encounter: Payer: Self-pay | Admitting: Internal Medicine

## 2017-04-24 VITALS — BP 152/78 | HR 85 | Temp 98.1°F | Ht 70.5 in | Wt 165.1 lb

## 2017-04-24 DIAGNOSIS — R259 Unspecified abnormal involuntary movements: Secondary | ICD-10-CM | POA: Diagnosis not present

## 2017-04-24 DIAGNOSIS — G8929 Other chronic pain: Secondary | ICD-10-CM

## 2017-04-24 DIAGNOSIS — M353 Polymyalgia rheumatica: Secondary | ICD-10-CM

## 2017-04-24 DIAGNOSIS — L03116 Cellulitis of left lower limb: Secondary | ICD-10-CM | POA: Diagnosis not present

## 2017-04-24 DIAGNOSIS — M25511 Pain in right shoulder: Secondary | ICD-10-CM

## 2017-04-24 DIAGNOSIS — L0390 Cellulitis, unspecified: Secondary | ICD-10-CM | POA: Insufficient documentation

## 2017-04-24 DIAGNOSIS — R609 Edema, unspecified: Secondary | ICD-10-CM

## 2017-04-24 DIAGNOSIS — M5440 Lumbago with sciatica, unspecified side: Secondary | ICD-10-CM | POA: Diagnosis not present

## 2017-04-24 MED ORDER — TORSEMIDE 20 MG PO TABS: 20.0000 mg | ORAL_TABLET | Freq: Every day | ORAL | 5 refills | Status: AC

## 2017-04-24 MED ORDER — METHYLPREDNISOLONE ACETATE 80 MG/ML IJ SUSP
80.0000 mg | Freq: Once | INTRAMUSCULAR | Status: AC
  Administered 2017-04-24: 80 mg via INTRA_ARTICULAR

## 2017-04-24 MED ORDER — DOXYCYCLINE HYCLATE 100 MG PO TABS: 100.0000 mg | ORAL_TABLET | Freq: Two times a day (BID) | ORAL | 0 refills | Status: DC

## 2017-04-24 MED ORDER — METHYLPREDNISOLONE ACETATE 80 MG/ML IJ SUSP: 80.0000 mg | Freq: Once | INTRAMUSCULAR | Status: DC

## 2017-04-24 MED ORDER — VENLAFAXINE HCL ER 150 MG PO CP24: 150.0000 mg | ORAL_CAPSULE | Freq: Every day | ORAL | 3 refills | Status: DC

## 2017-04-24 MED ORDER — HYDROMORPHONE HCL 4 MG PO TABS: 2.0000 mg | ORAL_TABLET | Freq: Four times a day (QID) | ORAL | 0 refills | Status: DC | PRN

## 2017-04-24 NOTE — Assessment & Plan Note (Signed)
Reduce Effexor dose to 150 mg/day

## 2017-04-24 NOTE — Assessment & Plan Note (Signed)
Will inject w/steroids Bleeding risk on Xarelto discussed (stop today)

## 2017-04-24 NOTE — Progress Notes (Signed)
Pre visit review using our clinic review tool, if applicable. No additional management support is needed unless otherwise documented below in the visit note. 

## 2017-04-24 NOTE — Assessment & Plan Note (Addendum)
LS X ray to r/o fx Dilaudid

## 2017-04-24 NOTE — Assessment & Plan Note (Signed)
On Prednisone 

## 2017-04-24 NOTE — Assessment & Plan Note (Addendum)
Doxy x 10 d 

## 2017-04-24 NOTE — Patient Instructions (Addendum)
Postprocedure instructions :    A Band-Aid should be left on for 12 hours. Injection therapy is not a cure itself. It is used in conjunction with other modalities. You can use nonsteroidal anti-inflammatories like ibuprofen , hot and cold compresses. Rest is recommended in the next 24 hours. You need to report immediately  if fever, chills or any signs of infection develop.  Reduce Effexor dose to 150 mg/day

## 2017-04-24 NOTE — Assessment & Plan Note (Signed)
Increase Demadex No DVT

## 2017-04-24 NOTE — Progress Notes (Signed)
Subjective:  Patient ID: Adam Booze., male    DOB: August 23, 1927  Age: 83 y.o. MRN: [id number]  CC: No chief complaint on file.   HPI Adam Booze. presents for leg swelling - better, LBP, CAD No DVT on the L C/o R shoulder pain - worse  Outpatient Medications Prior to Visit  Medication Sig Dispense Refill  . acetaminophen (TYLENOL) 500 MG tablet Take 1,000 mg by mouth every 6 (six) hours as needed for mild pain.    [name] allopurinol (ZYLOPRIM) 100 MG tablet Take 1 tablet (100 mg total) by mouth daily. 30 tablet 0  . amLODipine (NORVASC) 5 MG tablet Take by mouth daily.    [name] amoxicillin (AMOXIL) 500 MG capsule take 4 capsules by mouth 1 hour BEFORE APPOINTMENT  0  . aspirin EC 325 MG tablet Take 1 tablet (325 mg total) by mouth daily. 90 tablet 0  . atorvastatin (LIPITOR) 40 MG tablet Take 1 tablet (40 mg total) by mouth daily. 30 tablet 0  . carvedilol (COREG) 12.5 MG tablet Take 12.5 mg by mouth 2 (two) times daily with a meal.    . Cholecalciferol 1000 UNITS tablet Take 1,000 Units by mouth daily.      . colchicine (COLCRYS) 0.6 MG tablet Take 1 tablet (0.6 mg total) by mouth daily. 100 tablet 3  . docusate sodium (COLACE) 100 MG capsule Take 200 mg by mouth 2 (two) times daily.    . finasteride (PROSCAR) 5 MG tablet Take 5 mg by mouth daily.    . furosemide (LASIX) 40 MG tablet Take 40 mg by mouth daily.    [name] HYDROmorphone (DILAUDID) 4 MG tablet Take 0.5-1 tablets (2-4 mg total) by mouth every 6 (six) hours as needed for severe pain. 120 tablet 0  . lidocaine (LIDODERM) 5 % Place 1 patch onto the skin daily. Remove & Discard patch within 12 hours or as directed by MD    . Magnesium 200 MG TABS 1 po qd (Patient taking differently: 1 each by Per post-pyloric tube route daily. ) 100 each 3  . Multiple Vitamins-Minerals (OCUVITE PRESERVISION) TABS Take 2 tablets by mouth 2 (two) times daily.      [name] omeprazole (PRILOSEC) 20 MG capsule Take 20 mg by mouth daily.      . predniSONE  (DELTASONE) 10 MG tablet Take 1 tablet (10 mg total) by mouth daily with breakfast. 90 tablet 3  . Rivaroxaban (XARELTO) 15 MG TABS tablet Take 1 tablet (15 mg total) by mouth daily with supper. 10 tablet 0  . tobramycin (TOBREX) 0.3 % ophthalmic solution INSTILL 1 DROP INTO RIGHT EYE FOUR TIMES A DAY. BEGIN 1 DAY PRIOR...  (REFER TO PRESCRIPTION NOTES).  0  . torsemide (DEMADEX) 10 MG tablet Take 1 tablet (10 mg total) by mouth daily. 90 tablet 0  . Venlafaxine HCl 225 MG TB24 Take 225 mg by mouth daily.     Facility-Administered Medications Prior to Visit  Medication Dose Route Frequency Provider Last Rate Last Dose  . methylPREDNISolone acetate (DEPO-MEDROL) injection 40 mg  40 mg Intra-articular Once Aleksei Plotnikov V, MD      . methylPREDNISolone acetate (DEPO-MEDROL) injection 80 mg  80 mg Intra-articular Once Aleksei Plotnikov V, MD        ROS Review of Systems  Constitutional: Positive for fatigue. Negative for appetite change and unexpected weight change.  HENT: Negative for congestion, nosebleeds, sneezing, sore throat and trouble swallowing.   Eyes: Negative for itching  and visual disturbance.  Respiratory: Negative for cough.   Cardiovascular: Positive for leg swelling. Negative for chest pain and palpitations.  Gastrointestinal: Negative for abdominal distention, blood in stool, diarrhea and nausea.  Genitourinary: Negative for frequency and hematuria.  Musculoskeletal: Positive for arthralgias, back pain and gait problem. Negative for joint swelling and neck pain.  Skin: Negative for rash.  Neurological: Negative for dizziness, tremors, speech difficulty and weakness.  Psychiatric/Behavioral: Negative for agitation, dysphoric mood and sleep disturbance. The patient is not nervous/anxious.     Objective:  BP (!) 152/78 (BP Location: Left Arm, Patient Position: Sitting, Cuff Size: Normal)   Pulse 85   Temp 98.1 F (36.7 C) (Oral)   Ht 5' 10.5" (1.791 m)   Wt 165 lb 1.3  oz (74.9 kg)   SpO2 98%   BMI 23.35 kg/m   BP Readings from Last 3 Encounters:  04/24/17 (!) 152/78  04/13/17 (!) 160/80  01/23/17 120/80    Wt Readings from Last 3 Encounters:  04/24/17 165 lb 1.3 oz (74.9 kg)  04/13/17 169 lb 4 oz (76.8 kg)  01/23/17 169 lb (76.7 kg)    Physical Exam  Constitutional: He is oriented to person, place, and time. He appears well-developed. No distress.  NAD  HENT:  Mouth/Throat: Oropharynx is clear and moist.  Eyes: Conjunctivae are normal. Pupils are equal, round, and reactive to light.  Neck: Normal range of motion. No JVD present. No thyromegaly present.  Cardiovascular: Normal rate, regular rhythm, normal heart sounds and intact distal pulses.  Exam reveals no gallop and no friction rub.   No murmur heard. Pulmonary/Chest: Effort normal and breath sounds normal. No respiratory distress. He has no wheezes. He has no rales. He exhibits no tenderness.  Abdominal: Soft. Bowel sounds are normal. He exhibits no distension and no mass. There is no tenderness. There is no rebound and no guarding.  Musculoskeletal: Normal range of motion. He exhibits edema and tenderness.  Lymphadenopathy:    He has no cervical adenopathy.  Neurological: He is alert and oriented to person, place, and time. He has normal reflexes. No cranial nerve deficit. He exhibits normal muscle tone. He displays a negative Romberg sign. Coordination abnormal. Gait normal.  Skin: Skin is warm and dry. No rash noted.  Psychiatric: He has a normal mood and affect. His behavior is normal. Judgment and thought content normal.  1+ edema B L>R Ulcers on L upper calf, L erythema   R shoulder - tender    Procedure :Joint Injection,   shoulder   Indication:  Subacromial bursitis with refractory  chronic pain.   Risks including unsuccessful procedure , bleeding, infection, bruising, skin atrophy, "steroid flare-up" and others were explained to the patient in detail as well as the  benefits. Informed consent was obtained and signed.   Tthe patient was placed in a comfortable position. Lateral approach was used. Skin was prepped with Betadine and alcohol  and anesthetized with a cooling spray. Then, a 5 cc syringe with a 2 inch long 24-gauge needle was used for a joint injection.. The needle was advanced  Into the subacromial space.The bursa was injected with 3 mL of 2% lidocaine and 80 mg of Depo-Medrol .  Band-Aid was applied.   Tolerated well. Complications: None. Good pain relief following the procedure.   Postprocedure instructions :    A Band-Aid should be left on for 12 hours. Injection therapy is not a cure itself. It is used in conjunction with other modalities. You  can use nonsteroidal anti-inflammatories like ibuprofen , hot and cold compresses. Rest is recommended in the next 24 hours. You need to report immediately  if fever, chills or any signs of infection develop.    Lab Results  Component Value Date   WBC 14.6 (H) 04/13/2017   HGB 12.6 (L) 04/13/2017   HCT 39.0 04/13/2017   PLT 266.0 Platelet estimate appears normal. 04/13/2017   GLUCOSE 117 (H) 04/13/2017   CHOL 238 (H) 04/13/2017   TRIG 207.0 (H) 04/13/2017   HDL 46.20 04/13/2017   LDLDIRECT 129.0 04/13/2017   LDLCALC 62 06/04/2014   ALT 15 04/13/2017   AST 16 04/13/2017   NA 143 04/13/2017   K 5.4 (H) 04/13/2017   CL 104 04/13/2017   CREATININE 1.71 (H) 04/13/2017   BUN 44 (H) 04/13/2017   CO2 29 04/13/2017   TSH 3.29 11/30/2015   PSA 0.23 12/27/2013   INR 1.49 11/21/2009   HGBA1C 6.2 04/13/2017    No results found.  Assessment & Plan:   There are no diagnoses linked to this encounter. I am having Mr. Centola maintain his Cholecalciferol, OCUVITE PRESERVISION, omeprazole, carvedilol, acetaminophen, finasteride, docusate sodium, lidocaine, Venlafaxine HCl, furosemide, allopurinol, aspirin EC, atorvastatin, Magnesium, colchicine, amLODipine, tobramycin, amoxicillin, predniSONE,  HYDROmorphone, Rivaroxaban, and torsemide. We will continue to administer methylPREDNISolone acetate and methylPREDNISolone acetate.  No orders of the defined types were placed in this encounter.    Follow-up: No Follow-up on file.  Sonda Primes, MD

## 2017-04-27 ENCOUNTER — Ambulatory Visit (INDEPENDENT_AMBULATORY_CARE_PROVIDER_SITE_OTHER)
Admission: RE | Admit: 2017-04-27 | Discharge: 2017-04-27 | Disposition: A | Discharge status: Home / Self Care | Payer: Medicare Other | Source: Ambulatory Visit | Attending: Internal Medicine | Admitting: Internal Medicine

## 2017-04-27 DIAGNOSIS — M5440 Lumbago with sciatica, unspecified side: Secondary | ICD-10-CM

## 2017-04-27 DIAGNOSIS — G8929 Other chronic pain: Secondary | ICD-10-CM

## 2017-04-27 DIAGNOSIS — M545 Low back pain: Secondary | ICD-10-CM | POA: Diagnosis not present

## 2017-05-27 ENCOUNTER — Encounter: Payer: Self-pay | Admitting: Internal Medicine

## 2017-05-27 ENCOUNTER — Ambulatory Visit (INDEPENDENT_AMBULATORY_CARE_PROVIDER_SITE_OTHER): Payer: Medicare Other | Admitting: Internal Medicine

## 2017-05-27 DIAGNOSIS — G8929 Other chronic pain: Secondary | ICD-10-CM

## 2017-05-27 DIAGNOSIS — R700 Elevated erythrocyte sedimentation rate: Secondary | ICD-10-CM | POA: Diagnosis not present

## 2017-05-27 DIAGNOSIS — M5440 Lumbago with sciatica, unspecified side: Secondary | ICD-10-CM

## 2017-05-27 DIAGNOSIS — N281 Cyst of kidney, acquired: Secondary | ICD-10-CM | POA: Diagnosis not present

## 2017-05-27 DIAGNOSIS — R0789 Other chest pain: Secondary | ICD-10-CM | POA: Diagnosis not present

## 2017-05-27 MED ORDER — HYDROMORPHONE HCL 4 MG PO TABS: 2.0000 mg | ORAL_TABLET | Freq: Four times a day (QID) | ORAL | 0 refills | Status: DC | PRN

## 2017-05-27 MED ORDER — METHYLPREDNISOLONE ACETATE 40 MG/ML IJ SUSP
40.0000 mg | Freq: Once | INTRAMUSCULAR | Status: AC
  Administered 2017-05-27: 40 mg via INTRA_ARTICULAR

## 2017-05-27 NOTE — Progress Notes (Signed)
Subjective:  Patient ID: Adam Booze., male    DOB: 14-Mar-1927  Age: 83 y.o. MRN: [id number]  CC: No chief complaint on file.   HPI Adam Weeks. presents for R posterior pain under lower ribs irrad to the front x 2 months. X rays w/a new T11 fx  Outpatient Medications Prior to Visit  Medication Sig Dispense Refill  . acetaminophen (TYLENOL) 500 MG tablet Take 1,000 mg by mouth every 6 (six) hours as needed for mild pain.    [name] allopurinol (ZYLOPRIM) 100 MG tablet Take 1 tablet (100 mg total) by mouth daily. 30 tablet 0  . amLODipine (NORVASC) 5 MG tablet Take by mouth daily.    [name] amoxicillin (AMOXIL) 500 MG capsule take 4 capsules by mouth 1 hour BEFORE APPOINTMENT  0  . aspirin EC 325 MG tablet Take 1 tablet (325 mg total) by mouth daily. 90 tablet 0  . atorvastatin (LIPITOR) 40 MG tablet Take 1 tablet (40 mg total) by mouth daily. 30 tablet 0  . carvedilol (COREG) 12.5 MG tablet Take 12.5 mg by mouth 2 (two) times daily with a meal.    . Cholecalciferol 1000 UNITS tablet Take 1,000 Units by mouth daily.      . colchicine (COLCRYS) 0.6 MG tablet Take 1 tablet (0.6 mg total) by mouth daily. 100 tablet 3  . docusate sodium (COLACE) 100 MG capsule Take 200 mg by mouth 2 (two) times daily.    [name] doxycycline (VIBRA-TABS) 100 MG tablet Take 1 tablet (100 mg total) by mouth 2 (two) times daily. 20 tablet 0  . finasteride (PROSCAR) 5 MG tablet Take 5 mg by mouth daily.    [name] HYDROmorphone (DILAUDID) 4 MG tablet Take 0.5-1 tablets (2-4 mg total) by mouth every 6 (six) hours as needed for severe pain. 120 tablet 0  . lidocaine (LIDODERM) 5 % Place 1 patch onto the skin daily. Remove & Discard patch within 12 hours or as directed by MD    . Magnesium 200 MG TABS 1 po qd (Patient taking differently: 1 each by Per post-pyloric tube route daily. ) 100 each 3  . Multiple Vitamins-Minerals (OCUVITE PRESERVISION) TABS Take 2 tablets by mouth 2 (two) times daily.      [name] omeprazole (PRILOSEC) 20 MG  capsule Take 20 mg by mouth daily.      . predniSONE (DELTASONE) 10 MG tablet Take 1 tablet (10 mg total) by mouth daily with breakfast. 90 tablet 3  . tobramycin (TOBREX) 0.3 % ophthalmic solution INSTILL 1 DROP INTO RIGHT EYE FOUR TIMES A DAY. BEGIN 1 DAY PRIOR...  (REFER TO PRESCRIPTION NOTES).  0  . torsemide (DEMADEX) 20 MG tablet Take 1-2 tablets (20-40 mg total) by mouth daily. 60 tablet 5  . venlafaxine XR (EFFEXOR XR) 150 MG 24 hr capsule Take 1 capsule (150 mg total) by mouth daily with breakfast. 90 capsule 3   No facility-administered medications prior to visit.     ROS Review of Systems  Constitutional: Positive for fatigue. Negative for appetite change and unexpected weight change.  HENT: Negative for congestion, nosebleeds, sneezing, sore throat and trouble swallowing.   Eyes: Negative for itching and visual disturbance.  Respiratory: Negative for cough.   Cardiovascular: Negative for chest pain, palpitations and leg swelling.  Gastrointestinal: Negative for abdominal distention, blood in stool, diarrhea and nausea.  Genitourinary: Negative for frequency and hematuria.  Musculoskeletal: Positive for back pain and gait problem. Negative for joint swelling and neck  pain.  Skin: Negative for rash.  Neurological: Negative for dizziness, tremors, speech difficulty and weakness.  Psychiatric/Behavioral: Negative for agitation, dysphoric mood and sleep disturbance. The patient is not nervous/anxious.     Objective:  BP 128/72 (BP Location: Left Arm, Patient Position: Sitting, Cuff Size: Normal)   Pulse 79   Temp 97.8 F (36.6 C) (Oral)   Ht 5' 10.5" (1.791 m)   Wt 166 lb (75.3 kg)   SpO2 96%   BMI 23.48 kg/m   BP Readings from Last 3 Encounters:  05/27/17 128/72  04/24/17 (!) 152/78  04/13/17 (!) 160/80    Wt Readings from Last 3 Encounters:  05/27/17 166 lb (75.3 kg)  04/24/17 165 lb 1.3 oz (74.9 kg)  04/13/17 169 lb 4 oz (76.8 kg)    Physical Exam    Constitutional: He is oriented to person, place, and time. He appears well-developed. No distress.  NAD  HENT:  Mouth/Throat: Oropharynx is clear and moist.  Eyes: Conjunctivae are normal. Pupils are equal, round, and reactive to light.  Neck: Normal range of motion. No JVD present. No thyromegaly present.  Cardiovascular: Normal rate, regular rhythm, normal heart sounds and intact distal pulses.  Exam reveals no gallop and no friction rub.   No murmur heard. Pulmonary/Chest: Effort normal and breath sounds normal. No respiratory distress. He has no wheezes. He has no rales. He exhibits no tenderness.  Abdominal: Soft. Bowel sounds are normal. He exhibits no distension and no mass. There is no tenderness. There is no rebound and no guarding.  Musculoskeletal: Normal range of motion. He exhibits tenderness. He exhibits no edema.  Lymphadenopathy:    He has no cervical adenopathy.  Neurological: He is alert and oriented to person, place, and time. He has normal reflexes. No cranial nerve deficit. He exhibits normal muscle tone. He displays a negative Romberg sign. Coordination and gait normal.  Skin: Skin is warm and dry. No rash noted.  Psychiatric: He has a normal mood and affect. His behavior is normal. Judgment and thought content normal.  R lower thor spine and R paraspinal muscles - tender    Procedure Note :    Trigger Point Injection: R lumbar paraspinal    Indication : Focal tender area identifiable by the location without other identifiable neurologic or musculoskeletal finding or pathology.   Risks including unsuccessful procedure , bleeding, infection, bruising, skin atrophy and others were explained to the patient in detail as well as the benefits. Informed consent was obtained and signed.   Tthe patient was placed in a comfortable position.    points of maximum tenderness over paraspinal and trapezius muscles were marked and  the skin was prepped with Betadine and alcohol. 1  inch 25-gauge needle was used. The needle was advanced perpendicular to the skin. Each trigger point was injected with 1 mL of 2% lidocaine and 10 mg of Depo-Medrol in a usual fashion.  Band-Aids applied.   Tolerated well. Complications: None. Good pain relief following the procedure.      Lab Results  Component Value Date   WBC 14.6 (H) 04/13/2017   HGB 12.6 (L) 04/13/2017   HCT 39.0 04/13/2017   PLT 266.0 Platelet estimate appears normal. 04/13/2017   GLUCOSE 117 (H) 04/13/2017   CHOL 238 (H) 04/13/2017   TRIG 207.0 (H) 04/13/2017   HDL 46.20 04/13/2017   LDLDIRECT 129.0 04/13/2017   LDLCALC 62 06/04/2014   ALT 15 04/13/2017   AST 16 04/13/2017   NA 143 04/13/2017  K 5.4 (H) 04/13/2017   CL 104 04/13/2017   CREATININE 1.71 (H) 04/13/2017   BUN 44 (H) 04/13/2017   CO2 29 04/13/2017   TSH 3.29 11/30/2015   PSA 0.23 12/27/2013   INR 1.49 11/21/2009   HGBA1C 6.2 04/13/2017    Dg Lumbar Spine 2-3 Views  Result Date: 04/27/2017 CLINICAL DATA:  Low back pain for the past 2 weeks. History of kyphoplasty. EXAM: LUMBAR SPINE - 2-3 VIEW COMPARISON:  Lumbar spine series of September twenty-seventh 2013. FINDINGS: Since the previous study the patient has undergone kyphoplasty at T12. An untreated compression fracture at T11 has appeared since the previous study. The patient has undergone previous spinous process fusion at L2-3 and L3-4. There is chronic moderate to severe disc space narrowing at L1-2, L2-3, L3-4 and L5-S1. There is no significant spondylolisthesis. There is mild dextrocurvature centered at L2-3 which is stable. The observed portions of the sacrum are grossly normal. There is dense calcification in the wall of the abdominal aorta. IMPRESSION: Since the 2013 lumbar spine series compression fractures of T11 and T12 have developed with the T12 compression being treated with kyphoplasty. No lumbar spine compression fractures are observed. Stable spinous process fusion changes at  L3 and L4 are present. There is stable multilevel moderate to severe degenerative disc disease. Electronically Signed   By: David  SwazilandJordan M.D.   On: 04/27/2017 12:39    Assessment & Plan:   There are no diagnoses linked to this encounter. I am having Adam Weeks maintain his Cholecalciferol, OCUVITE PRESERVISION, omeprazole, carvedilol, acetaminophen, finasteride, docusate sodium, lidocaine, allopurinol, aspirin EC, atorvastatin, Magnesium, colchicine, amLODipine, tobramycin, amoxicillin, predniSONE, venlafaxine XR, torsemide, HYDROmorphone, and doxycycline.  No orders of the defined types were placed in this encounter.    Follow-up: No Follow-up on file.  Sonda PrimesAlex Dereon Corkery, MD

## 2017-05-27 NOTE — Patient Instructions (Signed)
Use TermaCare pad

## 2017-05-27 NOTE — Assessment & Plan Note (Signed)
abd US

## 2017-05-27 NOTE — Assessment & Plan Note (Addendum)
2013 Renal US IMPRESSION: Cholelithiasis without gallbladder wall thickening or pericholecystic fluid.  Bilateral renal cysts.  The dominant cysts in each kidney are not substantially changed since the CT scan of almost 3 years ago.  Repeat abd/renal US due to R pain

## 2017-05-27 NOTE — Assessment & Plan Note (Addendum)
?  related to T11 fx I offered to ref to Dr Jordan LikesSpivey if he would agree. [facility]. Use TermaCare pad Dilaudid renewed Trigger point inj

## 2017-05-30 NOTE — Assessment & Plan Note (Signed)
Monitor ESR

## 2017-08-26 ENCOUNTER — Telehealth: Payer: Self-pay | Admitting: Internal Medicine

## 2017-08-26 NOTE — Telephone Encounter (Signed)
[facility] - Client TELEPHONE ADVICE RECORD   [facility]     [facility] Initial Comment Caller states that she needs to make an appt for her partner; He is having SOB and dizziness  DOB: [birth year]      Nurse Assessment  Nurse: Nicanor Bakeosta, RN, Marylene LandAngela Date/Time (Eastern Time): 08/26/2017 10:49:22 PM  Confirm and document reason for call. If symptomatic, describe symptoms. ---Caller states pt is asleep currently. He has been getting short of breath while laying down. She states he has not been taking his medications like he should. He has also been getting dizzy episodes. She states he is not eating or drinking like he should. 2 days ago, he was eating good but he is back now not doing what he should be. He is breathing fine at this time. He does not even know she is calling.  Does the patient have any new or worsening symptoms? ---Yes  Will a triage be completed? ---Yes  Related visit to physician within the last 2 weeks? ---No  Does the PT have any chronic conditions? (i.e. diabetes, asthma, etc.) ---Yes  List chronic conditions. ---CAD, TIA, arthritis,  Is this a behavioral health or substance abuse call? ---No    Guidelines     Guideline Title Affirmed Question Affirmed Notes   Breathing Difficulty [1] MODERATE longstanding difficulty breathing (e.g., speaks in phrases, SOB even at rest, pulse 100-120) AND [2] SAME as normal    Final Disposition User   See PCP When Office is Open (within 3 days) [place], RN, Marylene Landngela   Comments   Pt needs appt for Thursday or Friday- can see any doctor. Unable to make appt- no "acute" option available.   Referrals   REFERRED TO PCP OFFICE   Disagree/Comply: Comply

## 2017-08-27 ENCOUNTER — Encounter: Payer: Self-pay | Admitting: Internal Medicine

## 2017-08-27 ENCOUNTER — Other Ambulatory Visit (INDEPENDENT_AMBULATORY_CARE_PROVIDER_SITE_OTHER): Payer: Medicare Other

## 2017-08-27 ENCOUNTER — Ambulatory Visit (INDEPENDENT_AMBULATORY_CARE_PROVIDER_SITE_OTHER): Payer: Medicare Other | Admitting: Internal Medicine

## 2017-08-27 VITALS — BP 130/74 | HR 79 | Temp 98.0°F | Ht 70.5 in | Wt 163.0 lb

## 2017-08-27 DIAGNOSIS — M549 Dorsalgia, unspecified: Secondary | ICD-10-CM

## 2017-08-27 DIAGNOSIS — I129 Hypertensive chronic kidney disease with stage 1 through stage 4 chronic kidney disease, or unspecified chronic kidney disease: Secondary | ICD-10-CM

## 2017-08-27 DIAGNOSIS — G8929 Other chronic pain: Secondary | ICD-10-CM

## 2017-08-27 DIAGNOSIS — Z7952 Long term (current) use of systemic steroids: Secondary | ICD-10-CM

## 2017-08-27 DIAGNOSIS — R5382 Chronic fatigue, unspecified: Secondary | ICD-10-CM

## 2017-08-27 DIAGNOSIS — I951 Orthostatic hypotension: Secondary | ICD-10-CM

## 2017-08-27 LAB — SEDIMENTATION RATE: Sed Rate: 50 mm/hr — ABNORMAL HIGH (ref 0–20)

## 2017-08-27 MED ORDER — METHYLPREDNISOLONE ACETATE 80 MG/ML IJ SUSP
80.0000 mg | Freq: Once | INTRAMUSCULAR | Status: AC
  Administered 2017-08-27: 80 mg via INTRAMUSCULAR

## 2017-08-27 NOTE — Progress Notes (Signed)
Subjective:    Patient ID: Adam BoozeLeo A Vanderhoff Jr., male    DOB: Oct 25, 1927, 83 y.o.   MRN: [id number]  HPI  Here to f/u with c/o lightheaded, dizzy for several days, feels off balance to walk, mild sob and admits to some medication non compliance, and has not taken any meds x 2 days.  Tends to sleep most of the day.  Has had lower po intake recently but Denies worsening reflux, abd pain, dysphagia, n/v, bowel change or blood.   Pt denies fever, wt loss, night sweats, loss of appetite, or other constitutional symptoms  Pt continues to have recurring LBP without change in severity, bowel or bladder change, fever, wt loss,  worsening LE pain/numbness/weakness, gait change or falls.  Has also right shoulder pain for several wks but wants to see PCP about this. Does c/o ongoing fatigue, but denies signficant daytime hypersomnolence. Past Medical History:  Diagnosis Date  . CAD (coronary artery disease)   . Chronic kidney disease    stones  . GERD (gastroesophageal reflux disease)   . History of gout 09/28/2014   2014  . Hyperlipidemia   . Hypertension   . Osteoporosis    Past Surgical History:  Procedure Laterality Date  . CARDIAC VALVE REPLACEMENT  2009   pig valve -- AVR  . CORONARY ARTERY BYPASS GRAFT     2009  . HERNIA REPAIR      reports that he has quit smoking. He has never used smokeless tobacco. He reports that he does not drink alcohol or use drugs. family history includes Cancer (age of onset: 5579) in his mother; Heart disease in his father. Allergies  Allergen Reactions  . Gabapentin     jumpy   Current Outpatient Prescriptions on File Prior to Visit  Medication Sig Dispense Refill  . acetaminophen (TYLENOL) 500 MG tablet Take 1,000 mg by mouth every 6 (six) hours as needed for mild pain.    [name]. allopurinol (ZYLOPRIM) 100 MG tablet Take 1 tablet (100 mg total) by mouth daily. 30 tablet 0  . aspirin EC 325 MG tablet Take 1 tablet (325 mg total) by mouth daily. 90 tablet 0  .  atorvastatin (LIPITOR) 40 MG tablet Take 1 tablet (40 mg total) by mouth daily. 30 tablet 0  . carvedilol (COREG) 12.5 MG tablet Take 12.5 mg by mouth 2 (two) times daily with a meal.    . Cholecalciferol 1000 UNITS tablet Take 1,000 Units by mouth daily.      . colchicine (COLCRYS) 0.6 MG tablet Take 1 tablet (0.6 mg total) by mouth daily. 100 tablet 3  . docusate sodium (COLACE) 100 MG capsule Take 200 mg by mouth 2 (two) times daily.    . finasteride (PROSCAR) 5 MG tablet Take 5 mg by mouth daily.    [name]. HYDROmorphone (DILAUDID) 4 MG tablet Take 0.5-1 tablets (2-4 mg total) by mouth every 6 (six) hours as needed for severe pain. 120 tablet 0  . lidocaine (LIDODERM) 5 % Place 1 patch onto the skin daily. Remove & Discard patch within 12 hours or as directed by MD    . Magnesium 200 MG TABS 1 po qd (Patient taking differently: 1 each by Per post-pyloric tube route daily. ) 100 each 3  . Multiple Vitamins-Minerals (OCUVITE PRESERVISION) TABS Take 2 tablets by mouth 2 (two) times daily.      [name]. omeprazole (PRILOSEC) 20 MG capsule Take 20 mg by mouth daily.      [name]. tobramycin (  TOBREX) 0.3 % ophthalmic solution INSTILL 1 DROP INTO RIGHT EYE FOUR TIMES A DAY. BEGIN 1 DAY PRIOR...  (REFER TO PRESCRIPTION NOTES).  0  . torsemide (DEMADEX) 20 MG tablet Take 1-2 tablets (20-40 mg total) by mouth daily. 60 tablet 5  . venlafaxine XR (EFFEXOR XR) 150 MG 24 hr capsule Take 1 capsule (150 mg total) by mouth daily with breakfast. 90 capsule 3   No current facility-administered medications on file prior to visit.    Review of Systems  Constitutional: Negative for other unusual diaphoresis or sweats HENT: Negative for ear discharge or swelling Eyes: Negative for other worsening visual disturbances Respiratory: Negative for stridor or other swelling  Gastrointestinal: Negative for worsening distension or other blood Genitourinary: Negative for retention or other urinary change Musculoskeletal: Negative for other  MSK pain or swelling Skin: Negative for color change or other new lesions Neurological: Negative for worsening tremors and other numbness  Psychiatric/Behavioral: Negative for worsening agitation or other fatigue All other system neg per pt    Objective:   Physical Exam BP 130/74   Pulse 79   Temp 98 F (36.7 C) (Oral)   Ht 5' 10.5" (1.791 m)   Wt 163 lb (73.9 kg)   SpO2 96%   BMI 23.06 kg/m  Orthostatic - lyting 120/70, stand 100/60 VS noted,  Constitutional: Pt appears in NAD HENT: Head: NCAT.  Right Ear: External ear normal.  Left Ear: External ear normal.  Eyes: . Pupils are equal, round, and reactive to light. Conjunctivae and EOM are normal Nose: without d/c or deformity Neck: Neck supple. Gross normal ROM Cardiovascular: Normal rate and regular rhythm.   Pulmonary/Chest: Effort normal and breath sounds without rales or wheezing.  Abd:  Soft, NT, ND, + BS, no organomegaly Neurological: Pt is alert. At baseline orientation, motor grossly intact Skin: Skin is warm. No rashes, other new lesions, no LE edema Psychiatric: Pt behavior is normal without agitation  No other exam findings  Lab Results  Component Value Date   WBC 14.6 (H) 04/13/2017   HGB 12.6 (L) 04/13/2017   HCT 39.0 04/13/2017   PLT 266.0 Platelet estimate appears normal. 04/13/2017   GLUCOSE 117 (H) 04/13/2017   CHOL 238 (H) 04/13/2017   TRIG 207.0 (H) 04/13/2017   HDL 46.20 04/13/2017   LDLDIRECT 129.0 04/13/2017   LDLCALC 62 06/04/2014   ALT 15 04/13/2017   AST 16 04/13/2017   NA 143 04/13/2017   K 5.4 (H) 04/13/2017   CL 104 04/13/2017   CREATININE 1.71 (H) 04/13/2017   BUN 44 (H) 04/13/2017   CO2 29 04/13/2017   TSH 3.29 11/30/2015   PSA 0.23 12/27/2013   INR 1.49 11/21/2009   HGBA1C 6.2 04/13/2017   04/13/2017 CHEST  2 VIEW - summary only IMPRESSION: 1. Compression fracture deformity of a lower thoracic vertebral body (the thoracic vertebral body just above the level of a  previous vertebroplasty), increased compared to previous chest x-rays, now approximately 50% compressed anteriorly. If any midline pain at the level of the lower thoracic spine, I would suspect acute on chronic compression fracture. No evidence of associated vertebral body subluxation. 2. No evidence of acute cardiopulmonary abnormality. No pneumonia or pulmonary edema. 3. Aortic atherosclerosis.  Transthoracic Echocardiography 06/04/2014 - [place] only Study Conclusions - Left ventricle: The cavity size was normal. Systolic function was normal. The estimated ejection fraction was in the range of 55% to 60%. Wall motion was normal; there were no regional wall motion abnormalities. -  Aortic valve: A bioprosthesis was present and functioning normally. Valve area (VTI): 1.45 cm^2. Valve area (Vmax): 1.32 cm^2. - Mitral valve: Calcified annulus. - Left atrium: The atrium was mildly to moderately dilated. - Tricuspid valve: There was moderate regurgitation. - Pulmonary arteries: PA peak pressure: 42 mm Hg (S). Impressions: - No cardiac source of embolism was identified, but cannot be ruled out on the basis of this examination. The right ventricular systolic pressure was increased consistent with mild pulmonary hypertension.  ECG June 2015 - NSR, PVC's and RBBB - declines repeat today     Assessment & Plan:

## 2017-08-27 NOTE — Patient Instructions (Signed)
You had the steroid shot today  OK to HOLD (do not take) the Demadex fluid pill (torsemide) for 5 days  OK to stop the amlodipine (norvasc)  Please drink more fluids in the next few days  Please continue all other medications as before, and refills have been done if requested.  Please have the pharmacy call with any other refills you may need.  You will be contacted regarding the referral for: Dr Jordan LikesSchmitz for back pain  Please keep your appointments with your specialists as you may have planned  Please go to the LAB in the Basement (turn left off the elevator) for the tests to be done today  You will be contacted by phone if any changes need to be made immediately.  Otherwise, you will receive a letter about your results with an explanation, but please check with MyChart first.  Please remember to sign up for MyChart if you have not done so, as this will be important to you in the future with finding out test results, communicating by private email, and scheduling acute appointments online when needed.  Please return in 5 days (or soon after) to Dr Posey ReaPlotnikov for Blood Pressure check and right shoulder pain

## 2017-08-28 ENCOUNTER — Encounter: Payer: Self-pay | Admitting: Internal Medicine

## 2017-08-28 LAB — CBC WITH DIFFERENTIAL/PLATELET
BASOS ABS: 0.1 10*3/uL (ref 0.0–0.1)
Basophils Relative: 0.8 % (ref 0.0–3.0)
EOS PCT: 0.2 % (ref 0.0–5.0)
Eosinophils Absolute: 0 10*3/uL (ref 0.0–0.7)
HCT: 39.1 % (ref 39.0–52.0)
Hemoglobin: 12.7 g/dL — ABNORMAL LOW (ref 13.0–17.0)
Lymphocytes Relative: 14.2 % (ref 12.0–46.0)
Lymphs Abs: 1.5 10*3/uL (ref 0.7–4.0)
MCHC: 32.4 g/dL (ref 30.0–36.0)
MCV: 100.8 fl — AB (ref 78.0–100.0)
MONO ABS: 0.5 10*3/uL (ref 0.1–1.0)
MONOS PCT: 4.9 % (ref 3.0–12.0)
Neutro Abs: 8.4 10*3/uL — ABNORMAL HIGH (ref 1.4–7.7)
Neutrophils Relative %: 79.9 % — ABNORMAL HIGH (ref 43.0–77.0)
Platelets: 216 10*3/uL (ref 150.0–400.0)
RBC: 3.87 Mil/uL — AB (ref 4.22–5.81)
RDW: 13.9 % (ref 11.5–15.5)
WBC: 10.5 10*3/uL (ref 4.0–10.5)

## 2017-08-28 LAB — HEPATIC FUNCTION PANEL
ALBUMIN: 3.7 g/dL (ref 3.5–5.2)
ALK PHOS: 70 U/L (ref 39–117)
ALT: 11 U/L (ref 0–53)
AST: 14 U/L (ref 0–37)
Bilirubin, Direct: 0 mg/dL (ref 0.0–0.3)
TOTAL PROTEIN: 6.8 g/dL (ref 6.0–8.3)
Total Bilirubin: 0.4 mg/dL (ref 0.2–1.2)

## 2017-08-28 LAB — URINALYSIS, ROUTINE W REFLEX MICROSCOPIC
Bilirubin Urine: NEGATIVE
Hgb urine dipstick: NEGATIVE
KETONES UR: NEGATIVE
Leukocytes, UA: NEGATIVE
Nitrite: NEGATIVE
PH: 5 (ref 5.0–8.0)
RBC / HPF: NONE SEEN (ref 0–?)
Specific Gravity, Urine: 1.02 (ref 1.000–1.030)
Total Protein, Urine: NEGATIVE
URINE GLUCOSE: NEGATIVE
UROBILINOGEN UA: 0.2 (ref 0.0–1.0)

## 2017-08-28 LAB — VITAMIN D 25 HYDROXY (VIT D DEFICIENCY, FRACTURES): VITD: 41.48 ng/mL (ref 30.00–100.00)

## 2017-08-28 LAB — VITAMIN B12: VITAMIN B 12: 1468 pg/mL — AB (ref 211–911)

## 2017-08-28 LAB — BASIC METABOLIC PANEL WITH GFR
BUN: 38 mg/dL — ABNORMAL HIGH (ref 6–23)
CO2: 24 meq/L (ref 19–32)
Calcium: 9.5 mg/dL (ref 8.4–10.5)
Chloride: 102 meq/L (ref 96–112)
Creatinine, Ser: 1.69 mg/dL — ABNORMAL HIGH (ref 0.40–1.50)
GFR: 40.9 mL/min — ABNORMAL LOW
Glucose, Bld: 148 mg/dL — ABNORMAL HIGH (ref 70–99)
Potassium: 4.8 meq/L (ref 3.5–5.1)
Sodium: 137 meq/L (ref 135–145)

## 2017-08-28 LAB — TSH: TSH: 2.19 u[IU]/mL (ref 0.35–4.50)

## 2017-08-28 LAB — CORTISOL: Cortisol, Plasma: 5 ug/dL

## 2017-08-29 NOTE — Assessment & Plan Note (Signed)
I have encourage pt to f/u with sports medicine for this

## 2017-08-29 NOTE — Assessment & Plan Note (Addendum)
Also for cortisol with lab due to chronic steroid exposure, also for depomedrol IM 80 due to not taking med recently

## 2017-08-29 NOTE — Assessment & Plan Note (Signed)
I suspect related to the orthostasis, with eval and tx as above

## 2017-08-29 NOTE — Assessment & Plan Note (Addendum)
Most likely due to lower recent po intake and lower volume, pt to hold on taking demedex x 5 day, increased po intake, and f/u with PCP next vist, for lab f/u as ordered  Note:  Total time for pt hx, exam, review of record with pt in the room, determination of diagnoses and plan for further eval and tx is > 40 min, with over 50% spent in coordination and counseling of patient, including the differential dx, tx, further eval and other management of orthostasis, low blood pressure, chronic back pain and fatigue

## 2017-08-29 NOTE — Assessment & Plan Note (Signed)
Ok to d/c amlodipine today due to lower BP, may need restart if improves hydration and BP elevates

## 2017-09-01 ENCOUNTER — Ambulatory Visit (INDEPENDENT_AMBULATORY_CARE_PROVIDER_SITE_OTHER): Payer: Medicare Other | Admitting: Internal Medicine

## 2017-09-01 ENCOUNTER — Encounter: Payer: Self-pay | Admitting: Internal Medicine

## 2017-09-01 DIAGNOSIS — M353 Polymyalgia rheumatica: Secondary | ICD-10-CM

## 2017-09-01 DIAGNOSIS — M25511 Pain in right shoulder: Secondary | ICD-10-CM

## 2017-09-01 DIAGNOSIS — M549 Dorsalgia, unspecified: Secondary | ICD-10-CM | POA: Diagnosis not present

## 2017-09-01 DIAGNOSIS — G8929 Other chronic pain: Secondary | ICD-10-CM

## 2017-09-01 DIAGNOSIS — I2510 Atherosclerotic heart disease of native coronary artery without angina pectoris: Secondary | ICD-10-CM

## 2017-09-01 MED ORDER — HYDROMORPHONE HCL 4 MG PO TABS
2.0000 mg | ORAL_TABLET | Freq: Four times a day (QID) | ORAL | 0 refills | Status: DC | PRN
Start: 2017-09-01 — End: 2017-09-01

## 2017-09-01 MED ORDER — PREDNISONE 10 MG PO TABS: ORAL_TABLET | ORAL | 1 refills | Status: AC

## 2017-09-01 MED ORDER — HYDROMORPHONE HCL 4 MG PO TABS: 2.0000 mg | ORAL_TABLET | Freq: Four times a day (QID) | ORAL | 0 refills | Status: AC | PRN

## 2017-09-01 MED ORDER — METHYLPREDNISOLONE ACETATE 40 MG/ML IJ SUSP
40.0000 mg | Freq: Once | INTRAMUSCULAR | Status: AC
  Administered 2017-09-01: 40 mg via INTRA_ARTICULAR

## 2017-09-01 NOTE — Assessment & Plan Note (Signed)
On Prednisone 10 mg/d - unable to use a lower dose due to pains

## 2017-09-01 NOTE — Assessment & Plan Note (Signed)
See procedure 

## 2017-09-01 NOTE — Assessment & Plan Note (Signed)
No angina 

## 2017-09-01 NOTE — Progress Notes (Signed)
Subjective:  Patient ID: [name]., male    DOB: [birth year]  Age: 83 y.o. MRN: [id number]  CC: No chief complaint on file.   HPI Domenico Achord. presents for weakness, dizziness, dehydration f/u. He saw Dr Jonny Ruiz on 8/30 - feeling better off Demadex, Norvasc. On Prednisone 10 mg/d - unable to use a lower dose due to pains  Outpatient Medications Prior to Visit  Medication Sig Dispense Refill  . acetaminophen (TYLENOL) 500 MG tablet Take 1,000 mg by mouth every 6 (six) hours as needed for mild pain.    [name] allopurinol (ZYLOPRIM) 100 MG tablet Take 1 tablet (100 mg total) by mouth daily. 30 tablet 0  . atorvastatin (LIPITOR) 40 MG tablet Take 1 tablet (40 mg total) by mouth daily. 30 tablet 0  . carvedilol (COREG) 12.5 MG tablet Take 12.5 mg by mouth 2 (two) times daily with a meal.    . Cholecalciferol 1000 UNITS tablet Take 1,000 Units by mouth daily.      [name] docusate sodium (COLACE) 100 MG capsule Take 200 mg by mouth 2 (two) times daily.    . finasteride (PROSCAR) 5 MG tablet Take 5 mg by mouth daily.    [name] HYDROmorphone (DILAUDID) 4 MG tablet Take 0.5-1 tablets (2-4 mg total) by mouth every 6 (six) hours as needed for severe pain. 120 tablet 0  . lidocaine (LIDODERM) 5 % Place 1 patch onto the skin daily. Remove & Discard patch within 12 hours or as directed by MD    . Magnesium 200 MG TABS 1 po qd (Patient taking differently: 1 each by Per post-pyloric tube route daily. ) 100 each 3  . Multiple Vitamins-Minerals (OCUVITE PRESERVISION) TABS Take 2 tablets by mouth 2 (two) times daily.      [name] omeprazole (PRILOSEC) 20 MG capsule Take 20 mg by mouth 2 (two) times daily before a meal.     . tobramycin (TOBREX) 0.3 % ophthalmic solution INSTILL 1 DROP INTO RIGHT EYE FOUR TIMES A DAY. BEGIN 1 DAY PRIOR...  (REFER TO PRESCRIPTION NOTES).  0  . torsemide (DEMADEX) 20 MG tablet Take 1-2 tablets (20-40 mg total) by mouth daily. 60 tablet 5  . venlafaxine XR (EFFEXOR XR) 150 MG 24 hr capsule Take  1 capsule (150 mg total) by mouth daily with breakfast. 90 capsule 3  . aspirin EC 325 MG tablet Take 1 tablet (325 mg total) by mouth daily. (Patient not taking: Reported on 09/01/2017) 90 tablet 0  . colchicine (COLCRYS) 0.6 MG tablet Take 1 tablet (0.6 mg total) by mouth daily. (Patient not taking: Reported on 09/01/2017) 100 tablet 3   No facility-administered medications prior to visit.     ROS Review of Systems  Constitutional: Positive for fatigue. Negative for appetite change and unexpected weight change.  HENT: Negative for congestion, nosebleeds, sneezing, sore throat and trouble swallowing.   Eyes: Negative for itching and visual disturbance.  Respiratory: Negative for cough.   Cardiovascular: Negative for chest pain, palpitations and leg swelling.  Gastrointestinal: Negative for abdominal distention, blood in stool, diarrhea and nausea.  Genitourinary: Negative for frequency and hematuria.  Musculoskeletal: Negative for back pain, gait problem, joint swelling and neck pain.  Skin: Negative for rash.  Neurological: Positive for weakness. Negative for dizziness, tremors and speech difficulty.  Psychiatric/Behavioral: Negative for agitation, dysphoric mood and sleep disturbance. The patient is nervous/anxious.     Objective:  BP (!) 162/82 (BP Location: Left Arm, Patient Position: Sitting, Cuff  Size: Normal)   Pulse 63   Temp 97.9 F (36.6 C) (Oral)   Ht 5' 10.5" (1.791 m)   Wt 165 lb (74.8 kg)   SpO2 99%   BMI 23.34 kg/m   BP Readings from Last 3 Encounters:  09/01/17 (!) 162/82  08/27/17 130/74  05/27/17 128/72    Wt Readings from Last 3 Encounters:  09/01/17 165 lb (74.8 kg)  08/27/17 163 lb (73.9 kg)  05/27/17 166 lb (75.3 kg)    Physical Exam  Constitutional: He is oriented to person, place, and time. He appears well-developed. No distress.  NAD  HENT:  Mouth/Throat: Oropharynx is clear and moist.  Eyes: Pupils are equal, round, and reactive to light.  Conjunctivae are normal.  Neck: Normal range of motion. No JVD present. No thyromegaly present.  Cardiovascular: Normal rate, regular rhythm, normal heart sounds and intact distal pulses.  Exam reveals no gallop and no friction rub.   No murmur heard. Pulmonary/Chest: Effort normal and breath sounds normal. No respiratory distress. He has no wheezes. He has no rales. He exhibits no tenderness.  Abdominal: Soft. Bowel sounds are normal. He exhibits no distension and no mass. There is no tenderness. There is no rebound and no guarding.  Musculoskeletal: Normal range of motion. He exhibits edema and tenderness.  Lymphadenopathy:    He has no cervical adenopathy.  Neurological: He is alert and oriented to person, place, and time. He has normal reflexes. No cranial nerve deficit. He exhibits normal muscle tone. He displays a negative Romberg sign. Coordination and gait normal.  Skin: Skin is warm and dry. No rash noted.  Psychiatric: He has a normal mood and affect. His behavior is normal. Judgment and thought content normal.  R shoulder hurts LS Spine tender Trace to 1+ edema on B ankles   Procedure :Joint Injection, R  shoulder   Indication:  Subacromial bursitis with refractory  chronic pain.   Risks including unsuccessful procedure , bleeding, infection, bruising, skin atrophy, "steroid flare-up" and others were explained to the patient in detail as well as the benefits. Informed consent was obtained and signed.   Tthe patient was placed in a comfortable position. Lateral approach was used. Skin was prepped with Betadine and alcohol  and anesthetized with a cooling spray. Then, a 5 cc syringe with a 2 inch long 24-gauge needle was used for a joint injection.. The needle was advanced  Into the subacromial space.The bursa was injected with 3 mL of 2% lidocaine and 40 mg of Depo-Medrol .  Band-Aid was applied.   Tolerated well. Complications: None. Good pain relief following the procedure.         Lab Results  Component Value Date   WBC 10.5 08/27/2017   HGB 12.7 (L) 08/27/2017   HCT 39.1 08/27/2017   PLT 216.0 08/27/2017   GLUCOSE 148 (H) 08/27/2017   CHOL 238 (H) 04/13/2017   TRIG 207.0 (H) 04/13/2017   HDL 46.20 04/13/2017   LDLDIRECT 129.0 04/13/2017   LDLCALC 62 06/04/2014   ALT 11 08/27/2017   AST 14 08/27/2017   NA 137 08/27/2017   K 4.8 08/27/2017   CL 102 08/27/2017   CREATININE 1.69 (H) 08/27/2017   BUN 38 (H) 08/27/2017   CO2 24 08/27/2017   TSH 2.19 08/27/2017   PSA 0.23 12/27/2013   INR 1.49 11/21/2009   HGBA1C 6.2 04/13/2017    Dg Lumbar Spine 2-3 Views  Result Date: 04/27/2017 CLINICAL DATA:  Low back pain for the  past 2 weeks. History of kyphoplasty. EXAM: LUMBAR SPINE - 2-3 VIEW COMPARISON:  Lumbar spine series of September twenty-seventh 2013. FINDINGS: Since the previous study the patient has undergone kyphoplasty at T12. An untreated compression fracture at T11 has appeared since the previous study. The patient has undergone previous spinous process fusion at L2-3 and L3-4. There is chronic moderate to severe disc space narrowing at L1-2, L2-3, L3-4 and L5-S1. There is no significant spondylolisthesis. There is mild dextrocurvature centered at L2-3 which is stable. The observed portions of the sacrum are grossly normal. There is dense calcification in the wall of the abdominal aorta. IMPRESSION: Since the 2013 lumbar spine series compression fractures of T11 and T12 have developed with the T12 compression being treated with kyphoplasty. No lumbar spine compression fractures are observed. Stable spinous process fusion changes at L3 and L4 are present. There is stable multilevel moderate to severe degenerative disc disease. Electronically Signed   By: David  [place]azil[place] M.D.   On: 04/27/2017 12:39    Assessment & Plan:   There are no diagnoses linked to this encounter. I have discontinued Mr. Rejeana BrockWelch's aspirin EC and colchicine. I am also having him  maintain his Cholecalciferol, OCUVITE PRESERVISION, omeprazole, carvedilol, acetaminophen, finasteride, docusate sodium, lidocaine, allopurinol, atorvastatin, Magnesium, tobramycin, venlafaxine XR, torsemide, HYDROmorphone, and aspirin.  Meds ordered this encounter  Medications  . aspirin 81 MG tablet    Sig: Take 81 mg by mouth daily.     Follow-up: No Follow-up on file.  Sonda PrimesAlex Wade Sigala, MD

## 2017-09-01 NOTE — Addendum Note (Signed)
Addended by: Scarlett PrestoFRIEDENBACH, Mayline Dragon on: 09/01/2017 02:45 PM   Modules accepted: Orders

## 2017-09-01 NOTE — Patient Instructions (Signed)
Postprocedure instructions :    A Band-Aid should be left on for 12 hours. Injection therapy is not a cure itself. It is used in conjunction with other modalities. You can use nonsteroidal anti-inflammatories like ibuprofen , hot and cold compresses. Rest is recommended in the next 24 hours. You need to report immediately  if fever, chills or any signs of infection develop. 

## 2017-09-18 ENCOUNTER — Encounter (HOSPITAL_COMMUNITY): Payer: Self-pay

## 2017-09-18 ENCOUNTER — Emergency Department (HOSPITAL_COMMUNITY): Payer: Medicare Other

## 2017-09-18 ENCOUNTER — Inpatient Hospital Stay (HOSPITAL_COMMUNITY)
Admission: EM | Admit: 2017-09-18 | Discharge: 2017-10-29 | DRG: 469 | Disposition: E | Payer: Medicare Other | Attending: Internal Medicine | Admitting: Internal Medicine

## 2017-09-18 DIAGNOSIS — M25511 Pain in right shoulder: Secondary | ICD-10-CM | POA: Diagnosis not present

## 2017-09-18 DIAGNOSIS — Z952 Presence of prosthetic heart valve: Secondary | ICD-10-CM | POA: Diagnosis not present

## 2017-09-18 DIAGNOSIS — W1830XA Fall on same level, unspecified, initial encounter: Secondary | ICD-10-CM | POA: Diagnosis present

## 2017-09-18 DIAGNOSIS — I2510 Atherosclerotic heart disease of native coronary artery without angina pectoris: Secondary | ICD-10-CM | POA: Diagnosis present

## 2017-09-18 DIAGNOSIS — Z978 Presence of other specified devices: Secondary | ICD-10-CM

## 2017-09-18 DIAGNOSIS — E1122 Type 2 diabetes mellitus with diabetic chronic kidney disease: Secondary | ICD-10-CM | POA: Diagnosis present

## 2017-09-18 DIAGNOSIS — I129 Hypertensive chronic kidney disease with stage 1 through stage 4 chronic kidney disease, or unspecified chronic kidney disease: Secondary | ICD-10-CM | POA: Diagnosis present

## 2017-09-18 DIAGNOSIS — Z515 Encounter for palliative care: Secondary | ICD-10-CM | POA: Diagnosis not present

## 2017-09-18 DIAGNOSIS — D72829 Elevated white blood cell count, unspecified: Secondary | ICD-10-CM | POA: Diagnosis present

## 2017-09-18 DIAGNOSIS — M7989 Other specified soft tissue disorders: Secondary | ICD-10-CM | POA: Diagnosis not present

## 2017-09-18 DIAGNOSIS — S4991XA Unspecified injury of right shoulder and upper arm, initial encounter: Secondary | ICD-10-CM | POA: Diagnosis not present

## 2017-09-18 DIAGNOSIS — Y92007 Garden or yard of unspecified non-institutional (private) residence as the place of occurrence of the external cause: Secondary | ICD-10-CM

## 2017-09-18 DIAGNOSIS — Z452 Encounter for adjustment and management of vascular access device: Secondary | ICD-10-CM

## 2017-09-18 DIAGNOSIS — Z7982 Long term (current) use of aspirin: Secondary | ICD-10-CM

## 2017-09-18 DIAGNOSIS — G8929 Other chronic pain: Secondary | ICD-10-CM | POA: Diagnosis present

## 2017-09-18 DIAGNOSIS — S72001A Fracture of unspecified part of neck of right femur, initial encounter for closed fracture: Principal | ICD-10-CM

## 2017-09-18 DIAGNOSIS — R000 Tachycardia, unspecified: Secondary | ICD-10-CM | POA: Diagnosis not present

## 2017-09-18 DIAGNOSIS — N183 Chronic kidney disease, stage 3 (moderate): Secondary | ICD-10-CM | POA: Diagnosis not present

## 2017-09-18 DIAGNOSIS — I63233 Cerebral infarction due to unspecified occlusion or stenosis of bilateral carotid arteries: Secondary | ICD-10-CM | POA: Diagnosis not present

## 2017-09-18 DIAGNOSIS — E2740 Unspecified adrenocortical insufficiency: Secondary | ICD-10-CM | POA: Diagnosis not present

## 2017-09-18 DIAGNOSIS — M549 Dorsalgia, unspecified: Secondary | ICD-10-CM | POA: Diagnosis present

## 2017-09-18 DIAGNOSIS — R579 Shock, unspecified: Secondary | ICD-10-CM | POA: Diagnosis not present

## 2017-09-18 DIAGNOSIS — Z9109 Other allergy status, other than to drugs and biological substances: Secondary | ICD-10-CM

## 2017-09-18 DIAGNOSIS — E872 Acidosis: Secondary | ICD-10-CM | POA: Diagnosis not present

## 2017-09-18 DIAGNOSIS — E785 Hyperlipidemia, unspecified: Secondary | ICD-10-CM | POA: Diagnosis present

## 2017-09-18 DIAGNOSIS — Y9389 Activity, other specified: Secondary | ICD-10-CM

## 2017-09-18 DIAGNOSIS — R29711 NIHSS score 11: Secondary | ICD-10-CM | POA: Diagnosis not present

## 2017-09-18 DIAGNOSIS — Z8249 Family history of ischemic heart disease and other diseases of the circulatory system: Secondary | ICD-10-CM | POA: Diagnosis not present

## 2017-09-18 DIAGNOSIS — Z66 Do not resuscitate: Secondary | ICD-10-CM | POA: Diagnosis present

## 2017-09-18 DIAGNOSIS — R34 Anuria and oliguria: Secondary | ICD-10-CM | POA: Diagnosis not present

## 2017-09-18 DIAGNOSIS — Z951 Presence of aortocoronary bypass graft: Secondary | ICD-10-CM | POA: Diagnosis not present

## 2017-09-18 DIAGNOSIS — G8191 Hemiplegia, unspecified affecting right dominant side: Secondary | ICD-10-CM | POA: Diagnosis present

## 2017-09-18 DIAGNOSIS — I10 Essential (primary) hypertension: Secondary | ICD-10-CM | POA: Diagnosis not present

## 2017-09-18 DIAGNOSIS — T148XXA Other injury of unspecified body region, initial encounter: Secondary | ICD-10-CM | POA: Diagnosis not present

## 2017-09-18 DIAGNOSIS — R079 Chest pain, unspecified: Secondary | ICD-10-CM | POA: Diagnosis not present

## 2017-09-18 DIAGNOSIS — J9690 Respiratory failure, unspecified, unspecified whether with hypoxia or hypercapnia: Secondary | ICD-10-CM

## 2017-09-18 DIAGNOSIS — R29818 Other symptoms and signs involving the nervous system: Secondary | ICD-10-CM | POA: Diagnosis not present

## 2017-09-18 DIAGNOSIS — Z7952 Long term (current) use of systemic steroids: Secondary | ICD-10-CM

## 2017-09-18 DIAGNOSIS — Z79899 Other long term (current) drug therapy: Secondary | ICD-10-CM

## 2017-09-18 DIAGNOSIS — I272 Pulmonary hypertension, unspecified: Secondary | ICD-10-CM | POA: Diagnosis present

## 2017-09-18 DIAGNOSIS — G459 Transient cerebral ischemic attack, unspecified: Secondary | ICD-10-CM | POA: Diagnosis present

## 2017-09-18 DIAGNOSIS — I25810 Atherosclerosis of coronary artery bypass graft(s) without angina pectoris: Secondary | ICD-10-CM | POA: Diagnosis not present

## 2017-09-18 DIAGNOSIS — J811 Chronic pulmonary edema: Secondary | ICD-10-CM | POA: Diagnosis present

## 2017-09-18 DIAGNOSIS — Z87891 Personal history of nicotine dependence: Secondary | ICD-10-CM | POA: Diagnosis not present

## 2017-09-18 DIAGNOSIS — Z79891 Long term (current) use of opiate analgesic: Secondary | ICD-10-CM

## 2017-09-18 DIAGNOSIS — N179 Acute kidney failure, unspecified: Secondary | ICD-10-CM | POA: Diagnosis not present

## 2017-09-18 DIAGNOSIS — R52 Pain, unspecified: Secondary | ICD-10-CM | POA: Diagnosis not present

## 2017-09-18 DIAGNOSIS — I469 Cardiac arrest, cause unspecified: Secondary | ICD-10-CM

## 2017-09-18 DIAGNOSIS — I361 Nonrheumatic tricuspid (valve) insufficiency: Secondary | ICD-10-CM | POA: Diagnosis not present

## 2017-09-18 DIAGNOSIS — S41109A Unspecified open wound of unspecified upper arm, initial encounter: Secondary | ICD-10-CM | POA: Diagnosis not present

## 2017-09-18 DIAGNOSIS — R4701 Aphasia: Secondary | ICD-10-CM

## 2017-09-18 DIAGNOSIS — I639 Cerebral infarction, unspecified: Secondary | ICD-10-CM | POA: Diagnosis not present

## 2017-09-18 DIAGNOSIS — I480 Paroxysmal atrial fibrillation: Secondary | ICD-10-CM

## 2017-09-18 DIAGNOSIS — D62 Acute posthemorrhagic anemia: Secondary | ICD-10-CM | POA: Diagnosis not present

## 2017-09-18 DIAGNOSIS — J9601 Acute respiratory failure with hypoxia: Secondary | ICD-10-CM | POA: Diagnosis not present

## 2017-09-18 DIAGNOSIS — I471 Supraventricular tachycardia: Secondary | ICD-10-CM | POA: Diagnosis not present

## 2017-09-18 DIAGNOSIS — R001 Bradycardia, unspecified: Secondary | ICD-10-CM

## 2017-09-18 DIAGNOSIS — I951 Orthostatic hypotension: Secondary | ICD-10-CM | POA: Diagnosis not present

## 2017-09-18 DIAGNOSIS — R55 Syncope and collapse: Secondary | ICD-10-CM

## 2017-09-18 DIAGNOSIS — S72009A Fracture of unspecified part of neck of unspecified femur, initial encounter for closed fracture: Secondary | ICD-10-CM

## 2017-09-18 DIAGNOSIS — S41111A Laceration without foreign body of right upper arm, initial encounter: Secondary | ICD-10-CM | POA: Diagnosis not present

## 2017-09-18 DIAGNOSIS — S299XXA Unspecified injury of thorax, initial encounter: Secondary | ICD-10-CM | POA: Diagnosis not present

## 2017-09-18 DIAGNOSIS — Z953 Presence of xenogenic heart valve: Secondary | ICD-10-CM | POA: Diagnosis not present

## 2017-09-18 DIAGNOSIS — I63312 Cerebral infarction due to thrombosis of left middle cerebral artery: Secondary | ICD-10-CM

## 2017-09-18 DIAGNOSIS — J9600 Acute respiratory failure, unspecified whether with hypoxia or hypercapnia: Secondary | ICD-10-CM | POA: Diagnosis not present

## 2017-09-18 DIAGNOSIS — R0602 Shortness of breath: Secondary | ICD-10-CM

## 2017-09-18 DIAGNOSIS — Z4682 Encounter for fitting and adjustment of non-vascular catheter: Secondary | ICD-10-CM | POA: Diagnosis not present

## 2017-09-18 DIAGNOSIS — M810 Age-related osteoporosis without current pathological fracture: Secondary | ICD-10-CM | POA: Diagnosis present

## 2017-09-18 DIAGNOSIS — K219 Gastro-esophageal reflux disease without esophagitis: Secondary | ICD-10-CM | POA: Diagnosis present

## 2017-09-18 DIAGNOSIS — N189 Chronic kidney disease, unspecified: Secondary | ICD-10-CM | POA: Diagnosis not present

## 2017-09-18 DIAGNOSIS — R748 Abnormal levels of other serum enzymes: Secondary | ICD-10-CM | POA: Diagnosis not present

## 2017-09-18 DIAGNOSIS — G451 Carotid artery syndrome (hemispheric): Secondary | ICD-10-CM

## 2017-09-18 DIAGNOSIS — S72031A Displaced midcervical fracture of right femur, initial encounter for closed fracture: Secondary | ICD-10-CM | POA: Diagnosis not present

## 2017-09-18 DIAGNOSIS — M25521 Pain in right elbow: Secondary | ICD-10-CM | POA: Diagnosis not present

## 2017-09-18 HISTORY — DX: Cerebral infarction, unspecified: I63.9

## 2017-09-18 LAB — CBC
HCT: 37.4 % — ABNORMAL LOW (ref 39.0–52.0)
Hemoglobin: 12.1 g/dL — ABNORMAL LOW (ref 13.0–17.0)
MCH: 32.8 pg (ref 26.0–34.0)
MCHC: 32.4 g/dL (ref 30.0–36.0)
MCV: 101.4 fL — AB (ref 78.0–100.0)
PLATELETS: 156 10*3/uL (ref 150–400)
RBC: 3.69 MIL/uL — AB (ref 4.22–5.81)
RDW: 14 % (ref 11.5–15.5)
WBC: 14.6 10*3/uL — ABNORMAL HIGH (ref 4.0–10.5)

## 2017-09-18 LAB — I-STAT CHEM 8, ED
BUN: 42 mg/dL — AB (ref 6–20)
CHLORIDE: 108 mmol/L (ref 101–111)
Calcium, Ion: 1.03 mmol/L — ABNORMAL LOW (ref 1.15–1.40)
Creatinine, Ser: 1.6 mg/dL — ABNORMAL HIGH (ref 0.61–1.24)
Glucose, Bld: 127 mg/dL — ABNORMAL HIGH (ref 65–99)
HEMATOCRIT: 36 % — AB (ref 39.0–52.0)
Hemoglobin: 12.2 g/dL — ABNORMAL LOW (ref 13.0–17.0)
POTASSIUM: 4.8 mmol/L (ref 3.5–5.1)
Sodium: 141 mmol/L (ref 135–145)
TCO2: 22 mmol/L (ref 22–32)

## 2017-09-18 LAB — PROTIME-INR
INR: 1.1
PROTHROMBIN TIME: 14.1 s (ref 11.4–15.2)

## 2017-09-18 LAB — COMPREHENSIVE METABOLIC PANEL
ALT: 19 U/L (ref 17–63)
AST: 25 U/L (ref 15–41)
Albumin: 3.5 g/dL (ref 3.5–5.0)
Alkaline Phosphatase: 59 U/L (ref 38–126)
Anion gap: 16 — ABNORMAL HIGH (ref 5–15)
BILIRUBIN TOTAL: 0.8 mg/dL (ref 0.3–1.2)
BUN: 42 mg/dL — AB (ref 6–20)
CHLORIDE: 106 mmol/L (ref 101–111)
CO2: 18 mmol/L — ABNORMAL LOW (ref 22–32)
CREATININE: 1.67 mg/dL — AB (ref 0.61–1.24)
Calcium: 8.8 mg/dL — ABNORMAL LOW (ref 8.9–10.3)
GFR calc Af Amer: 41 mL/min — ABNORMAL LOW (ref >60)
GFR, EST NON AFRICAN AMERICAN: 35 mL/min — AB (ref >60)
Glucose, Bld: 133 mg/dL — ABNORMAL HIGH (ref 65–99)
Potassium: 5 mmol/L (ref 3.5–5.1)
Sodium: 140 mmol/L (ref 135–145)
TOTAL PROTEIN: 6.5 g/dL (ref 6.5–8.1)

## 2017-09-18 LAB — APTT: aPTT: 35 seconds (ref 24–36)

## 2017-09-18 LAB — DIFFERENTIAL
Basophils Absolute: 0 10*3/uL (ref 0.0–0.1)
Basophils Relative: 0 %
Eosinophils Absolute: 0.1 10*3/uL (ref 0.0–0.7)
Eosinophils Relative: 1 %
LYMPHS ABS: 1.4 10*3/uL (ref 0.7–4.0)
Lymphocytes Relative: 10 %
MONOS PCT: 5 %
Monocytes Absolute: 0.8 10*3/uL (ref 0.1–1.0)
NEUTROS ABS: 12.3 10*3/uL — AB (ref 1.7–7.7)
NEUTROS PCT: 84 %

## 2017-09-18 LAB — I-STAT TROPONIN, ED: TROPONIN I, POC: 0.04 ng/mL (ref 0.00–0.08)

## 2017-09-18 LAB — CK: CK TOTAL: 45 U/L — AB (ref 49–397)

## 2017-09-18 LAB — CBG MONITORING, ED: Glucose-Capillary: 135 mg/dL — ABNORMAL HIGH (ref 65–99)

## 2017-09-18 MED ORDER — PREDNISONE 10 MG PO TABS
10.0000 mg | ORAL_TABLET | Freq: Every day | ORAL | Status: DC
  Administered 2017-09-20: 10 mg via ORAL
  Filled 2017-09-18: qty 1

## 2017-09-18 MED ORDER — VITAMIN D 1000 UNITS PO TABS
1000.0000 [IU] | ORAL_TABLET | Freq: Every day | ORAL | Status: DC
  Administered 2017-09-20 – 2017-09-26 (×7): 1000 [IU] via ORAL
  Filled 2017-09-18 (×7): qty 1

## 2017-09-18 MED ORDER — FINASTERIDE 5 MG PO TABS
5.0000 mg | ORAL_TABLET | Freq: Every day | ORAL | Status: DC
  Filled 2017-09-18: qty 1

## 2017-09-18 MED ORDER — SODIUM CHLORIDE 0.9 % IV SOLN
INTRAVENOUS | Status: DC
  Administered 2017-09-19 (×2): via INTRAVENOUS

## 2017-09-18 MED ORDER — IOPAMIDOL (ISOVUE-370) INJECTION 76%
INTRAVENOUS | Status: AC
  Administered 2017-09-18: 50 mL
  Filled 2017-09-18: qty 50

## 2017-09-18 MED ORDER — PANTOPRAZOLE SODIUM 40 MG PO TBEC
40.0000 mg | DELAYED_RELEASE_TABLET | Freq: Two times a day (BID) | ORAL | Status: DC
  Administered 2017-09-19 – 2017-09-20 (×2): 40 mg via ORAL
  Filled 2017-09-18 (×2): qty 1

## 2017-09-18 MED ORDER — LIDOCAINE 5 % EX PTCH
1.0000 | MEDICATED_PATCH | Freq: Every day | CUTANEOUS | Status: DC | PRN
  Filled 2017-09-18: qty 1

## 2017-09-18 MED ORDER — PROSIGHT PO TABS
2.0000 | ORAL_TABLET | Freq: Two times a day (BID) | ORAL | Status: DC
  Administered 2017-09-20 – 2017-09-23 (×8): 2 via ORAL
  Filled 2017-09-18 (×11): qty 2

## 2017-09-18 MED ORDER — ACETAMINOPHEN 500 MG PO TABS: 1000.0000 mg | ORAL_TABLET | Freq: Four times a day (QID) | ORAL | Status: DC | PRN

## 2017-09-18 MED ORDER — FENTANYL CITRATE (PF) 100 MCG/2ML IJ SOLN
50.0000 ug | Freq: Once | INTRAMUSCULAR | Status: AC
  Administered 2017-09-18: 50 ug via INTRAVENOUS
  Filled 2017-09-18: qty 2

## 2017-09-18 MED ORDER — ASPIRIN 81 MG PO CHEW
81.0000 mg | CHEWABLE_TABLET | Freq: Two times a day (BID) | ORAL | Status: DC
  Administered 2017-09-19: 81 mg via ORAL
  Filled 2017-09-18 (×2): qty 1

## 2017-09-18 MED ORDER — ALLOPURINOL 100 MG PO TABS: 100.0000 mg | ORAL_TABLET | Freq: Every day | ORAL | Status: DC

## 2017-09-18 MED ORDER — DOCUSATE SODIUM 100 MG PO CAPS
200.0000 mg | ORAL_CAPSULE | Freq: Two times a day (BID) | ORAL | Status: DC
  Administered 2017-09-19: 200 mg via ORAL
  Filled 2017-09-18: qty 2

## 2017-09-18 MED ORDER — SODIUM CHLORIDE 0.9 % IV BOLUS (SEPSIS)
1000.0000 mL | Freq: Once | INTRAVENOUS | Status: AC
  Administered 2017-09-18: 1000 mL via INTRAVENOUS

## 2017-09-18 MED ORDER — STROKE: EARLY STAGES OF RECOVERY BOOK
Freq: Once | Status: AC
  Administered 2017-09-19: 03:00:00
  Filled 2017-09-18: qty 1

## 2017-09-18 MED ORDER — VENLAFAXINE HCL ER 75 MG PO CP24
225.0000 mg | ORAL_CAPSULE | Freq: Every day | ORAL | Status: DC
  Administered 2017-09-20: 08:00:00 225 mg via ORAL
  Filled 2017-09-18 (×2): qty 1

## 2017-09-18 MED ORDER — ATORVASTATIN CALCIUM 40 MG PO TABS: 40.0000 mg | ORAL_TABLET | Freq: Every day | ORAL | Status: DC

## 2017-09-18 MED ORDER — HYDROMORPHONE HCL 1 MG/ML IJ SOLN
0.5000 mg | INTRAMUSCULAR | Status: DC | PRN
  Administered 2017-09-19 (×3): 1 mg via INTRAVENOUS
  Administered 2017-09-19: 0.5 mg via INTRAVENOUS
  Administered 2017-09-19 (×2): 1 mg via INTRAVENOUS
  Filled 2017-09-18 (×7): qty 1

## 2017-09-18 NOTE — ED Provider Notes (Addendum)
[facility]-EMERGENCY DEPT Provider Note   CSN: [id number] Arrival date & time: 09/26/2017  1959    History   Chief Complaint Chief Complaint  Patient presents with  . Code Stroke    LEVEL 5 CAVEAT 2/2 ACUITY OF CONDITION  HPI Adam Weeks. is a 83 y.o. male.  83 year old male presents to the emergency department from home as a code stroke. Patient with LS and 1700 today. He reportedly went outside to wash his car and was found on the ground in the driveway by his neighbor who called the patient's wife. Wife found the patient and called 911. EMS reports the patient to be aphasic with bilateral weakness on arrival. Patient was complaining of pain, but unable to follow commands or answer questions on their arrival. Patient is on pain medication for his chronic back pain. No use of chronic anticoagulation.   The history is provided by the EMS personnel. No language interpreter was used.    Past Medical History:  Diagnosis Date  . CAD (coronary artery disease)   . Chronic kidney disease    stones  . GERD (gastroesophageal reflux disease)   . History of gout 09/28/2014   2014  . Hyperlipidemia   . Hypertension   . Osteoporosis   . Stroke [facility])    TIA    Patient Active Problem List   Diagnosis Date Noted  . Current chronic use of systemic steroids 08/27/2017  . Chronic back pain 08/27/2017  . Renal cyst 05/27/2017  . Cellulitis 04/24/2017  . Pain and swelling of lower leg, left 04/13/2017  . Right-sided chest wall pain 04/13/2017  . Dyspnea 01/23/2017  . Memory loss 03/15/2015  . History of gout 09/28/2014  . Involuntary movements 09/28/2014  . Orthostasis 09/28/2014  . Hypertensive renal disease with renal failure 06/16/2014  . PMR (polymyalgia rheumatica) (HCC) 06/16/2014  . CKD (chronic kidney disease) stage 3, GFR 30-59 ml/min 06/04/2014  . Elevated sed rate 06/04/2014  . TIA (transient ischemic attack) 06/03/2014  . Hypomagnesemia 06/03/2014  . Metabolic alkalosis  06/03/2014  . Dehydration 06/03/2014  . Generalized weakness 06/03/2014  . Well adult exam 12/27/2013  . Fatigue 12/27/2013  . Peripheral edema 12/09/2013  . Arthritis of right shoulder region 12/09/2013  . Right shoulder pain 12/09/2013  . Foot swelling 04/23/2013  . Anemia 04/23/2013  . Hyperuricemia 04/23/2013  . Diverticulitis 10/08/2012  . Contusion, flank 10/08/2012  . Gall stones 10/01/2012  . Constipation 09/24/2012  . CAD (coronary artery disease) 09/23/2011  . S/P AVR (aortic valve replacement) 09/23/2011  . Low back pain 09/23/2011  . Dyslipidemia 09/23/2011  . Osteopenia 09/23/2011  . Preventative health care 09/21/2011    Past Surgical History:  Procedure Laterality Date  . CARDIAC VALVE REPLACEMENT  2009   pig valve -- AVR  . CORONARY ARTERY BYPASS GRAFT     2009  . HERNIA REPAIR         Home Medications    Prior to Admission medications   Medication Sig Start Date End Date Taking? Authorizing Provider  acetaminophen (TYLENOL) 500 MG tablet Take 1,000 mg by mouth every 6 (six) hours as needed for mild pain.   Yes [provider]  allopurinol (ZYLOPRIM) 100 MG tablet Take 1 tablet (100 mg total) by mouth daily. 06/05/14  Yes Short, Thea Silversmith, MD  aspirin 81 MG tablet Take 81 mg by mouth 2 (two) times daily.    Yes [provider]  atorvastatin (LIPITOR) 40 MG tablet Take 1 tablet (  40 mg total) by mouth daily. 06/05/14  Yes Short, Thea Silversmith, MD  carvedilol (COREG) 12.5 MG tablet Take 12.5 mg by mouth 2 (two) times daily with a meal.   Yes [provider]  Cholecalciferol 1000 UNITS tablet Take 1,000 Units by mouth daily.     Yes [provider]  docusate sodium (COLACE) 100 MG capsule Take 200 mg by mouth 2 (two) times daily.   Yes [provider]  finasteride (PROSCAR) 5 MG tablet Take 5 mg by mouth daily.   Yes [provider]  HYDROmorphone (DILAUDID) 4 MG tablet Take 0.5-1 tablets (2-4 mg total) by mouth  every 6 (six) hours as needed for severe pain. 09/01/17  Yes Plotnikov, Georgina Quint, MD  lidocaine (LIDODERM) 5 % Place 1 patch onto the skin as needed (for pain). Remove & Discard patch within 12 hours or as directed by MD    Yes [provider]  Magnesium 200 MG TABS 1 po qd Patient taking differently: Take 1 each by mouth daily.  10/23/14  Yes Plotnikov, Georgina Quint, MD  Multiple Vitamins-Minerals (OCUVITE PRESERVISION) TABS Take 2 tablets by mouth 2 (two) times daily.     Yes [provider]  omeprazole (PRILOSEC) 20 MG capsule Take 20 mg by mouth 2 (two) times daily before a meal.    Yes [provider]  predniSONE (DELTASONE) 10 MG tablet 1 po qd Patient taking differently: Take 10 mg by mouth daily with breakfast.  09/01/17  Yes Plotnikov, Georgina Quint, MD  torsemide (DEMADEX) 20 MG tablet Take 1-2 tablets (20-40 mg total) by mouth daily. Patient taking differently: Take 20 mg by mouth daily.  04/24/17  Yes Plotnikov, Georgina Quint, MD  venlafaxine XR (EFFEXOR-XR) 75 MG 24 hr capsule Take 225 mg by mouth daily with breakfast.   Yes [provider]  venlafaxine XR (EFFEXOR XR) 150 MG 24 hr capsule Take 1 capsule (150 mg total) by mouth daily with breakfast. Patient not taking: Reported on 09/02/2017 04/24/17   Plotnikov, Georgina Quint, MD    Family History Family History  Problem Relation Age of Onset  . Cancer Mother 26       breast ca  . Heart disease Father        CAD    Social History Social History  Substance Use Topics  . Smoking status: Former Games developer  . Smokeless tobacco: Never Used  . Alcohol use No     Allergies   Gabapentin   Review of Systems Review of Systems  Unable to perform ROS: Acuity of condition     Physical Exam Updated Vital Signs BP (!) 177/96   Pulse (!) 109   Resp 20   Wt 75.4 kg (166 lb 3.6 oz)   SpO2 93%   BMI 23.51 kg/m   Physical Exam  Constitutional: He appears well-developed and well-nourished.  HENT:  Head:  Normocephalic and atraumatic.  Eyes: Pupils are equal, round, and reactive to light. Conjunctivae and EOM are normal. No scleral icterus.  Neck: Normal range of motion.  Normal ROM  Cardiovascular: Normal rate, regular rhythm and intact distal pulses.   DP pulse in the RLE confirmed on bedside doppler as difficult to palpate secondary to pallor and coolness of the extremity.  Pulmonary/Chest: Effort normal. No respiratory distress. He has no wheezes. He has no rales.  Respirations even and unlabored  Musculoskeletal: He exhibits tenderness (R hip) and deformity (RLE).       Arms: TTP to the RLE  with shortening and external rotation.  Neurological: He is alert.  Expressive aphasia. No facial drooping. Tongue midline. No drift. Reflexes intact in BLE. Moving LLE spontaneously, but limited exam of RLE given likely hip fracture.  Skin: Skin is warm and dry. He is not diaphoretic.  Large abrasion to left lateral elbow and small abrasion at wrist.   Psychiatric: He has a normal mood and affect. His behavior is normal.  Nursing note and vitals reviewed.    ED Treatments / Results  Labs (all labs ordered are listed, but only abnormal results are displayed) Labs Reviewed  CBC - Abnormal; Notable for the following:       Result Value   WBC 14.6 (*)    RBC 3.69 (*)    Hemoglobin 12.1 (*)    HCT 37.4 (*)    MCV 101.4 (*)    All other components within normal limits  DIFFERENTIAL - Abnormal; Notable for the following:    Neutro Abs 12.3 (*)    All other components within normal limits  COMPREHENSIVE METABOLIC PANEL - Abnormal; Notable for the following:    CO2 18 (*)    Glucose, Bld 133 (*)    BUN 42 (*)    Creatinine, Ser 1.67 (*)    Calcium 8.8 (*)    GFR calc non Af Amer 35 (*)    GFR calc Af Amer 41 (*)    Anion gap 16 (*)    All other components within normal limits  CK - Abnormal; Notable for the following:    Total CK 45 (*)    All other components within normal limits  CBG  MONITORING, ED - Abnormal; Notable for the following:    Glucose-Capillary 135 (*)    All other components within normal limits  I-STAT CHEM 8, ED - Abnormal; Notable for the following:    BUN 42 (*)    Creatinine, Ser 1.60 (*)    Glucose, Bld 127 (*)    Calcium, Ion 1.03 (*)    Hemoglobin 12.2 (*)    HCT 36.0 (*)    All other components within normal limits  PROTIME-INR  APTT  I-STAT TROPONIN, ED    EKG  EKG Interpretation  Date/Time:  Friday September 18 2017 20:32:25 EDT Ventricular Rate:  106 PR Interval:    QRS Duration: 132 QT Interval:  362 QTC Calculation: 481 R Axis:   -57 Text Interpretation:  Sinus tachycardia Atrial premature complex RBBB and LAFB No significant change since last tracing Confirmed by Shaune Pollack [phone]) on 09/23/2017 10:20:09 PM       Radiology Ct Angio Head W Or Wo Contrast  Result Date: 08/31/2017 CLINICAL DATA:  Initial evaluation for acute aphasia. EXAM: CT ANGIOGRAPHY HEAD AND NECK TECHNIQUE: Multidetector CT imaging of the head and neck was performed using the standard protocol during bolus administration of intravenous contrast. Multiplanar CT image reconstructions and MIPs were obtained to evaluate the vascular anatomy. Carotid stenosis measurements (when applicable) are obtained utilizing NASCET criteria, using the distal internal carotid diameter as the denominator. CONTRAST:  50 cc of Isovue 370. COMPARISON:  Prior CT from earlier same day. FINDINGS: CTA NECK FINDINGS Aortic arch: Visualized aortic arch of normal caliber with with normal branch pattern. Scattered atheromatous plaque present about the arch itself as well as the origin of the great vessels without flow-limiting stenosis. Visualized subclavian artery is widely patent. Right carotid system: Right common carotid artery patent from its origin to the bifurcation. Calcified noncalcified plaque about  the right bifurcation/proximal right ICA. Associated stenosis of up to 70% by  NASCET criteria. Right ICA patent distally to the skullbase without stenosis. Left carotid system: Left common carotid artery tortuous proximally but widely patent to the bifurcation. Calcified plaque about the left bifurcation without flow-limiting stenosis. Left ICA patent distally to the skullbase without stenosis or occlusion. Vertebral arteries: Both vertebral arteries arise from the subclavian arteries. Vertebral artery's tortuous with scattered atheromatous irregularity without high-grade flow-limiting stenosis. Vertebral arteries are patent to the skullbase. Skeleton: No acute osseus abnormality. No worrisome lytic or blastic osseous lesions. Advanced degenerative spondylolysis throughout the cervical spine. Grade 1 anterolisthesis of C2 on C3 and C7 on T1. Other neck: No acute soft tissue abnormality within the neck. Salivary glands demonstrate no acute abnormality. Thyroid within normal limits. No adenopathy. Upper chest: Circumferential wall thickening about the visualized upper esophagus. Visualized upper mediastinum otherwise within normal limits. Partially visualized lungs are clear. Review of the MIP images confirms the above findings CTA HEAD FINDINGS Anterior circulation: Petrous segments widely patent bilaterally. Smooth atheromatous plaque throughout the cavernous/ supraclinoid ICAs with moderate diffuse narrowing. Changes most notable at the supraclinoid right ICA. ICA termini patent bilaterally. A1 segments irregular but patent without stenosis. Patent anterior communicating artery. Multifocal atheromatous irregularity within the A2 segments bilaterally, left greater than right, without high-grade stenosis. ACAs are patent to their distal aspects. Right M1 segment somewhat irregular but patent to its distal aspect without flow-limiting stenosis. Right MCA bifurcation normal. Proximal M2 stenoses and irregularity without occlusion. MCA branches demonstrate extensive atheromatous irregularity but  are perfused to their distal aspects. Left M1 segment patent without stenosis or occlusion. Proximal and 2 stenoses and irregularity without occlusion. Distal left MCA branches are extensively irregular but patent to their distal aspects. Posterior circulation: Multifocal atheromatous plaque within the bilateral V4 segments with mild to moderate multifocal narrowing. Left vertebral artery dominant. Posterior inferior cerebral arteries are patent proximally. Basilar artery irregular but patent to its distal aspect without high-grade stenosis. Superior cerebral arteries patent bilaterally. Right PCA supplied via the basilar and is widely patent to its distal aspect with moderate atheromatous irregularity. Fetal type origin of the left PCA supplied via a patent left posterior communicating artery. Left PCA also patent to its distal aspect without flow-limiting stenosis with moderate multifocal atheromatous irregularity. Venous sinuses: Not well evaluated due to arterial timing of the contrast bolus. Anatomic variants: Fetal type origin of the left PCA. No other significant. Delayed phase: Not performed. Review of the MIP images confirms the above findings IMPRESSION: 1. Negative CTA for emergent large vessel occlusion. 2. Atheromatous plaque at the right carotid bifurcation with associated stenosis of up to 70% by NASCET criteria. 3. Additional extensive atheromatous disease involving the medium and small vessels of the head and neck as above. No other high-grade or correctable stenosis identified. 4. Diffuse esophageal wall thickening about the visualized upper esophagus. Considerations include sequelae of reflux disease or possibly acute esophagitis. Critical Value/emergent results were called by telephone at the time of interpretation on 09/16/2017 at 8:31 pm to Dr. Noel Christmas , who verbally acknowledged these results. Electronically Signed   By: Rise Mu M.D.   On: 09/05/2017 21:07   Dg Chest 1  View  Result Date: 09/25/2017 CLINICAL DATA:  Fall with pain. EXAM: CHEST 1 VIEW COMPARISON:  04/13/2017 FINDINGS: Lungs are adequately inflated with mild elevation of the left hemidiaphragm unchanged. No focal lobar consolidation or effusion. No pneumothorax. Mild stable cardiomegaly. Calcified plaque over  the aortic arch. Remainder of the exam is unchanged. IMPRESSION: No acute cardiopulmonary disease. Stable cardiomegaly. Aortic Atherosclerosis (ICD10-I70.0). Electronically Signed   By: Elberta Fortis M.D.   On: 09/01/2017 21:47   Dg Elbow Complete Right (3+view)  Result Date: 08/31/2017 CLINICAL DATA:  Fall with right elbow pain. EXAM: RIGHT ELBOW - COMPLETE 3+ VIEW COMPARISON:  None. FINDINGS: Lateral film is not optimal. No evidence of acute fracture or dislocation. No significant joint effusion. IMPRESSION: No acute findings. Electronically Signed   By: Elberta Fortis M.D.   On: 09/13/2017 21:49   Ct Angio Neck W Or Wo Contrast  Result Date: 09/16/2017 CLINICAL DATA:  Initial evaluation for acute aphasia. EXAM: CT ANGIOGRAPHY HEAD AND NECK TECHNIQUE: Multidetector CT imaging of the head and neck was performed using the standard protocol during bolus administration of intravenous contrast. Multiplanar CT image reconstructions and MIPs were obtained to evaluate the vascular anatomy. Carotid stenosis measurements (when applicable) are obtained utilizing NASCET criteria, using the distal internal carotid diameter as the denominator. CONTRAST:  50 cc of Isovue 370. COMPARISON:  Prior CT from earlier same day. FINDINGS: CTA NECK FINDINGS Aortic arch: Visualized aortic arch of normal caliber with with normal branch pattern. Scattered atheromatous plaque present about the arch itself as well as the origin of the great vessels without flow-limiting stenosis. Visualized subclavian artery is widely patent. Right carotid system: Right common carotid artery patent from its origin to the bifurcation. Calcified  noncalcified plaque about the right bifurcation/proximal right ICA. Associated stenosis of up to 70% by NASCET criteria. Right ICA patent distally to the skullbase without stenosis. Left carotid system: Left common carotid artery tortuous proximally but widely patent to the bifurcation. Calcified plaque about the left bifurcation without flow-limiting stenosis. Left ICA patent distally to the skullbase without stenosis or occlusion. Vertebral arteries: Both vertebral arteries arise from the subclavian arteries. Vertebral artery's tortuous with scattered atheromatous irregularity without high-grade flow-limiting stenosis. Vertebral arteries are patent to the skullbase. Skeleton: No acute osseus abnormality. No worrisome lytic or blastic osseous lesions. Advanced degenerative spondylolysis throughout the cervical spine. Grade 1 anterolisthesis of C2 on C3 and C7 on T1. Other neck: No acute soft tissue abnormality within the neck. Salivary glands demonstrate no acute abnormality. Thyroid within normal limits. No adenopathy. Upper chest: Circumferential wall thickening about the visualized upper esophagus. Visualized upper mediastinum otherwise within normal limits. Partially visualized lungs are clear. Review of the MIP images confirms the above findings CTA HEAD FINDINGS Anterior circulation: Petrous segments widely patent bilaterally. Smooth atheromatous plaque throughout the cavernous/ supraclinoid ICAs with moderate diffuse narrowing. Changes most notable at the supraclinoid right ICA. ICA termini patent bilaterally. A1 segments irregular but patent without stenosis. Patent anterior communicating artery. Multifocal atheromatous irregularity within the A2 segments bilaterally, left greater than right, without high-grade stenosis. ACAs are patent to their distal aspects. Right M1 segment somewhat irregular but patent to its distal aspect without flow-limiting stenosis. Right MCA bifurcation normal. Proximal M2  stenoses and irregularity without occlusion. MCA branches demonstrate extensive atheromatous irregularity but are perfused to their distal aspects. Left M1 segment patent without stenosis or occlusion. Proximal and 2 stenoses and irregularity without occlusion. Distal left MCA branches are extensively irregular but patent to their distal aspects. Posterior circulation: Multifocal atheromatous plaque within the bilateral V4 segments with mild to moderate multifocal narrowing. Left vertebral artery dominant. Posterior inferior cerebral arteries are patent proximally. Basilar artery irregular but patent to its distal aspect without high-grade stenosis. Superior cerebral arteries  patent bilaterally. Right PCA supplied via the basilar and is widely patent to its distal aspect with moderate atheromatous irregularity. Fetal type origin of the left PCA supplied via a patent left posterior communicating artery. Left PCA also patent to its distal aspect without flow-limiting stenosis with moderate multifocal atheromatous irregularity. Venous sinuses: Not well evaluated due to arterial timing of the contrast bolus. Anatomic variants: Fetal type origin of the left PCA. No other significant. Delayed phase: Not performed. Review of the MIP images confirms the above findings IMPRESSION: 1. Negative CTA for emergent large vessel occlusion. 2. Atheromatous plaque at the right carotid bifurcation with associated stenosis of up to 70% by NASCET criteria. 3. Additional extensive atheromatous disease involving the medium and small vessels of the head and neck as above. No other high-grade or correctable stenosis identified. 4. Diffuse esophageal wall thickening about the visualized upper esophagus. Considerations include sequelae of reflux disease or possibly acute esophagitis. Critical Value/emergent results were called by telephone at the time of interpretation on 09/10/2017 at 8:31 pm to Dr. Noel Christmas , who verbally acknowledged  these results. Electronically Signed   By: Rise Mu M.D.   On: 09/20/2017 21:07   Dg Humerus Right  Result Date: 09/08/2017 CLINICAL DATA:  Fall with pain. EXAM: RIGHT HUMERUS - 2+ VIEW COMPARISON:  None. FINDINGS: Diffuse osteopenia. Mild degenerate change of the glenohumeral joint and AC joint. No acute fracture or dislocation. IMPRESSION: No acute findings. Electronically Signed   By: Elberta Fortis M.D.   On: 09/22/2017 21:48   Dg Hip Unilat With Pelvis 2-3 Views Right  Result Date: 08/29/2017 CLINICAL DATA:  Fall with pain. EXAM: DG HIP (WITH OR WITHOUT PELVIS) 2-3V RIGHT COMPARISON:  None. FINDINGS: There is diffuse osteopenia. There mild degenerate changes of the hips. There is a displaced fracture of the right femoral neck likely subcapital in location. Superior displacement of the distal fragment. Hardware over the lumbosacral spine. Several pelvic phleboliths. IMPRESSION: Displaced right femoral neck fracture. Electronically Signed   By: Elberta Fortis M.D.   On: 09/24/2017 21:46   Ct Head Code Stroke Wo Contrast  Result Date: 09/05/2017 CLINICAL DATA:  Code stroke. Initial evaluation for acute aphasia. Weakness. EXAM: CT HEAD WITHOUT CONTRAST TECHNIQUE: Contiguous axial images were obtained from the base of the skull through the vertex without intravenous contrast. COMPARISON:  Prior MRI from 06/04/2014. FINDINGS: Brain: Generalized age related cerebral atrophy with chronic small vessel ischemic disease. Patchy 13 mm hypodensity within the inferior left cerebellar hemisphere, suspicious for evolving acute ischemic infarct (series 3, image 5). No other definite acute large vessel territory infarct. No acute intracranial hemorrhage. No mass lesion or midline shift. No hydrocephalus. No extra-axial fluid collection. Vascular: Question asymmetric hyperdensity involving the right M1 segment. Prominent vascular calcifications within the carotid siphons. Skull: Scalp soft tissues and  calvarium within normal limits. Sinuses/Orbits: Globes and orbital soft tissues normal. Patient status post lens extraction bilaterally. Chronic right sphenoid sinus disease. Paranasal sinuses otherwise clear. No mastoid effusion. Other: None. ASPECTS [facility] Stroke Program Early CT Score) - Ganglionic level infarction (caudate, lentiform nuclei, internal capsule, insula, M1-M3 cortex): 7 - Supraganglionic infarction (M4-M6 cortex): 3 Total score (0-10 with 10 being normal): 10 IMPRESSION: 1. Question asymmetric hyperdensity within the right M1 segment/dense M1, which may reflect intravascular thrombus. Further evaluation with CTA recommended. No definite evidence for evolving left MCA territory ischemia. No acute intracranial hemorrhage. 2. ASPECTS is 10 3. Patchy hypodensity within the inferior left cerebellar hemisphere, suspicious for  evolving acute left PICA territory infarct. 4. Atrophy with chronic small vessel ischemic disease. Critical Value/emergent results were called by telephone at the time of interpretation on September 27, 2017 at 8:31 pm to Dr. Roseanne Reno , who verbally acknowledged these results. Electronically Signed   By: Rise Mu M.D.   On: 2017/09/27 20:31    Procedures Procedures (including critical care time)  Medications Ordered in ED Medications  iopamidol (ISOVUE-370) 76 % injection (50 mLs  Contrast Given 09-27-17 2027)  fentaNYL (SUBLIMAZE) injection 50 mcg (50 mcg Intravenous Given 09-27-17 2101)  sodium chloride 0.9 % bolus 1,000 mL (0 mLs Intravenous Stopped 09-27-2017 2235)    CRITICAL CARE Performed by: Antony Madura   Total critical care time: 35 minutes  Critical care time was exclusive of separately billable procedures and treating other patients.  Critical care was necessary to treat or prevent imminent or life-threatening deterioration.  Critical care was time spent personally by me on the following activities: development of treatment plan with patient and/or  surrogate as well as nursing, discussions with consultants, evaluation of patient's response to treatment, examination of patient, obtaining history from patient or surrogate, ordering and performing treatments and interventions, ordering and review of laboratory studies, ordering and review of radiographic studies, pulse oximetry and re-evaluation of patient's condition.    Initial Impression / Assessment and Plan / ED Course  I have reviewed the triage vital signs and the nursing notes.  Pertinent labs & imaging results that were available during my care of the patient were reviewed by me and considered in my medical decision making (see chart for details).    75:39 PM 83 year old male presenting to the emergency department as a code stroke after he was found down in his driveway by a neighbor. EMS reports aphasia on scene Patient unable to answer all questions. He is following some commands. There remains concern for underlying aphasia. Code stroke initiated on arrival. CT questions asymmetric hyperdensity within the right M1 segment/dense M1, which may reflect intravascular thrombus. Per Dr. Roseanne Reno, patient NOT a candidate for IR or TPA.   Patient with RLE shortening and malrotation. There exists a concern for hip fx 2/2 fall. Will obtains Xrays to assess for trauma.  10:05 PM Patient now able to recognize family as well as self, month, place. States that he was cleaning of his car and rinsing it with a hose when his right hand slipped off the hood and he lost his balance and fell on his hip. Found by Lennox Grumbles, who called 911. Aphasia seems to have resolved. Plan to consult orthopedics with regards to hip fx on Xray. Remaining Xrays reassuring. Anticipate admission to [facility]. Neurology will continue to follow in formal consultation.  10:25 PM Case discussed with Dr. Jena Gauss, Orthopedics. Anticipate surgery tomorrow. Orthopedics will see in the morning for formal consultation.  10:55  PM Dr. Julian Reil of [facility] aware of patient; will admit.   Final Clinical Impressions(s) / ED Diagnoses   Final diagnoses:  Closed displaced fracture of right femoral neck (HCC)  Aphasia  Skin tear of right upper extremity    New Prescriptions New Prescriptions   No medications on file     Darylene Price 2017-09-27 2300    Shaune Pollack, MD 09/24/2017 1119    Antony Madura, PA-C 08/31/2017 2017    Shaune Pollack, MD 09/20/17 1133

## 2017-09-18 NOTE — ED Triage Notes (Signed)
Pt from home with ems as code stroke and fall. LSN 1700 today. Pt went outside, neighbor found pt on ground in driveway. Pt aphasic and weak on both sides upon EMS arrival. Pt unable to follow commands or answer questions. Pt in severe pain.

## 2017-09-18 NOTE — Consult Note (Signed)
Neurology Consult:  Referring physician: Dr. Erma Heritage   Chief Complaint: Acute aphasia and right-sided weakness.  HPI: Adam Weeks. is an 83 y.o. male with a history of stroke, hypertension, hyperlipidemia, coronary artery disease, chronic kidney disease and osteoporosis brought to the ED in code stroke status after being found down on his driveway. He was last seen well at 5:00 PM. Patient was noted to have receptive and expressive aphasia. He also sustained trauma to his right extremities and was complaining of severe pain, particularly involving his right hip, and to a lesser extent his right upper elbow and shoulder. CT scan of his head showed an area of low density involving left cerebellum suspicious for possible acute stroke. No acute lesion involving his left cerebrum was noted. CT angiogram showed no large vessel occlusion or severe stenosis. Patient had an MRI study in June 2015 which showed an area of remote hemorrhage involving the right cerebellum. Patient was not given TPA because of this finding on MRI as well as acute trauma with possible acute right hip fracture.  LSN: 5:00 PM on 09/13/2017 tPA Given: No: As noted above in present illness mRankin:  Past Medical History:  Diagnosis Date  . CAD (coronary artery disease)   . Chronic kidney disease    stones  . GERD (gastroesophageal reflux disease)   . History of gout 09/28/2014   2014  . Hyperlipidemia   . Hypertension   . Osteoporosis   . Stroke [facility])    TIA    Past Surgical History:  Procedure Laterality Date  . CARDIAC VALVE REPLACEMENT  2009   pig valve -- AVR  . CORONARY ARTERY BYPASS GRAFT     2009  . HERNIA REPAIR      Family History  Problem Relation Age of Onset  . Cancer Mother 51       breast ca  . Heart disease Father        CAD   Social History:  reports that he has quit smoking. He has never used smokeless tobacco. He reports that he does not drink alcohol or use drugs.  Allergies:  Allergies   Allergen Reactions  . Gabapentin     jumpy    Medications: Preadmission medications were reviewed by me.  ROS: Unobtainable due to patient's acute aphasia.  Physical Examination: Weight 75.4 kg (166 lb 3.6 oz), SpO2 95 %.  HEENT-  Normocephalic, no lesions, without obvious abnormality.  Normal external eye and conjunctiva.  Normal TM's bilaterally.  Normal auditory canals and external ears. Normal external nose, mucus membranes and septum.  Normal pharynx. Neck supple with no masses, nodes, nodules or enlargement. Cardiovascular - regular rate and rhythm, S1, S2 normal, no murmur, click, rub or gallop Lungs - chest clear, no wheezing, rales, normal symmetric air entry Abdomen - soft, non-tender; bowel sounds normal; no masses,  no organomegaly Extremities - right lower extremity was externally rotated and about 1 inch shorter than the left. He had severe pain with movement of his right lower extremity, particularly at the hip. There were areas of multiple trauma involving his elbow and forearm and also complained of movement at the elbow and to a lesser extent at the shoulder.  Neurologic Examination: Patient was alert and complaining of right side pain which was markedly worsened with any movement. He had moderately severe receptive and expressive aphasia. He was occasionally able to follow simple commands with use of left extremities. Visual fields were intact bilaterally. Pupils were equal and  reacted normally to light. Extraocular movements were full and conjugate. Patient had no gaze preference in any direction. Face was symmetrical with no focal weakness. Speech was not dysarthric, although content at times was gibberish. Motor exam of right extremities was difficult to assess because of severe pain with movement of right upper and lower extremities. Weakness cannot be ruled out. Strength and tone of left extremities were normal. Deep tendon reflexes were 1+ and  symmetrical. Plantar responses were mute.  Results for orders placed or performed during the hospital encounter of 09/22/2017 (from the past 48 hour(s))  CBG monitoring, ED     Status: Abnormal   Collection Time: 09/01/2017  8:00 PM  Result Value Ref Range   Glucose-Capillary 135 (H) 65 - 99 mg/dL  Protime-INR     Status: None   Collection Time: 09/17/2017  8:02 PM  Result Value Ref Range   Prothrombin Time 14.1 11.4 - 15.2 seconds   INR 1.10   APTT     Status: None   Collection Time: 09/12/2017  8:02 PM  Result Value Ref Range   aPTT 35 24 - 36 seconds  CBC     Status: Abnormal   Collection Time: 09/07/2017  8:02 PM  Result Value Ref Range   WBC 14.6 (H) 4.0 - 10.5 K/uL   RBC 3.69 (L) 4.22 - 5.81 MIL/uL   Hemoglobin 12.1 (L) 13.0 - 17.0 g/dL   HCT 16.1 (L) 09.6 - 04.5 %   MCV 101.4 (H) 78.0 - 100.0 fL   MCH 32.8 26.0 - 34.0 pg   MCHC 32.4 30.0 - 36.0 g/dL   RDW 40.9 81.1 - 91.4 %   Platelets 156 150 - 400 K/uL  Differential     Status: Abnormal   Collection Time: 09/09/2017  8:02 PM  Result Value Ref Range   Neutrophils Relative % 84 %   Neutro Abs 12.3 (H) 1.7 - 7.7 K/uL   Lymphocytes Relative 10 %   Lymphs Abs 1.4 0.7 - 4.0 K/uL   Monocytes Relative 5 %   Monocytes Absolute 0.8 0.1 - 1.0 K/uL   Eosinophils Relative 1 %   Eosinophils Absolute 0.1 0.0 - 0.7 K/uL   Basophils Relative 0 %   Basophils Absolute 0.0 0.0 - 0.1 K/uL  I-stat troponin, ED     Status: None   Collection Time: 09/01/2017  8:06 PM  Result Value Ref Range   Troponin i, poc 0.04 0.00 - 0.08 ng/mL   Comment 3            Comment: Due to the release kinetics of cTnI, a negative result within the first hours of the onset of symptoms does not rule out myocardial infarction with certainty. If myocardial infarction is still suspected, repeat the test at appropriate intervals.   I-Stat Chem 8, ED     Status: Abnormal   Collection Time: 09/01/2017  8:07 PM  Result Value Ref Range   Sodium 141 135 - 145 mmol/L    Potassium 4.8 3.5 - 5.1 mmol/L   Chloride 108 101 - 111 mmol/L   BUN 42 (H) 6 - 20 mg/dL   Creatinine, Ser 7.82 (H) 0.61 - 1.24 mg/dL   Glucose, Bld 956 (H) 65 - 99 mg/dL   Calcium, Ion 2.13 (L) 1.15 - 1.40 mmol/L   TCO2 22 22 - 32 mmol/L   Hemoglobin 12.2 (L) 13.0 - 17.0 g/dL   HCT 08.6 (L) 57.8 - 46.9 %   Ct Head Code Stroke  Wo Contrast  Result Date: 09/13/2017 CLINICAL DATA:  Code stroke. Initial evaluation for acute aphasia. Weakness. EXAM: CT HEAD WITHOUT CONTRAST TECHNIQUE: Contiguous axial images were obtained from the base of the skull through the vertex without intravenous contrast. COMPARISON:  Prior MRI from 06/04/2014. FINDINGS: Brain: Generalized age related cerebral atrophy with chronic small vessel ischemic disease. Patchy 13 mm hypodensity within the inferior left cerebellar hemisphere, suspicious for evolving acute ischemic infarct (series 3, image 5). No other definite acute large vessel territory infarct. No acute intracranial hemorrhage. No mass lesion or midline shift. No hydrocephalus. No extra-axial fluid collection. Vascular: Question asymmetric hyperdensity involving the right M1 segment. Prominent vascular calcifications within the carotid siphons. Skull: Scalp soft tissues and calvarium within normal limits. Sinuses/Orbits: Globes and orbital soft tissues normal. Patient status post lens extraction bilaterally. Chronic right sphenoid sinus disease. Paranasal sinuses otherwise clear. No mastoid effusion. Other: None. ASPECTS [facility] Stroke Program Early CT Score) - Ganglionic level infarction (caudate, lentiform nuclei, internal capsule, insula, M1-M3 cortex): 7 - Supraganglionic infarction (M4-M6 cortex): 3 Total score (0-10 with 10 being normal): 10 IMPRESSION: 1. Question asymmetric hyperdensity within the right M1 segment/dense M1, which may reflect intravascular thrombus. Further evaluation with CTA recommended. No definite evidence for evolving left MCA territory  ischemia. No acute intracranial hemorrhage. 2. ASPECTS is 10 3. Patchy hypodensity within the inferior left cerebellar hemisphere, suspicious for evolving acute left PICA territory infarct. 4. Atrophy with chronic small vessel ischemic disease. Critical Value/emergent results were called by telephone at the time of interpretation on 09/17/2017 at 8:31 pm to Dr. Roseanne Reno , who verbally acknowledged these results. Electronically Signed   By: Rise Mu M.D.   On: 09/27/2017 20:31    Assessment: 83 y.o. male presenting with probable acute left MCA territory ischemic infarction. Significance of left cerebellar finding on CT scan is unclear at this point. Patient has also sustained significant trauma involving his right extremities and right hip. Right hip fracture as well as right elbow and possibly right shoulder fracture will need to be ruled out.  Stroke Risk Factors - hyperlipidemia and hypertension  Plan: 1. HgbA1c, fasting lipid panel 2. MRI  of the brain without contrast 3. PT consult, OT consult, Speech consult 4. Echocardiogram 5. Prophylactic therapy - None for now, as acute right hip fracture is highly suspected. 6. Risk factor modification 7. Telemetry monitoring  C.R. Roseanne Reno, MD [facility] [phone]  09/06/2017, 8:47 PM

## 2017-09-18 NOTE — H&P (Addendum)
History and Physical    Adam Weeks. ZOX:[id number] DOB: April 24, 1927 DOA: 09/25/2017  PCP: [name], MD  Patient coming from: Home  I have personally briefly reviewed patient's old medical records in [facility]  Chief Complaint: Fall, Hip pain, aphasia and R sided weakness  HPI: Adam Weeks. is a 83 y.o. male with medical history significant of TIA, HTN, CAD s/p CABG, AVR s/p pig valve, CKD stage 3.  Patient LKW 5pm.  Found down in driveway.  Had expressive and receptive aphasia, severe pain in R hip and to lesser extent R elbow.  CT head showed suspicious area in L cerebrum for acute stroke.  Patient not given TPA due to prior MRI in 2015 which showed remote hemorrhage R cerebellum, as well as acute traumatic injury.  R hip is broken as well.  Neuro rec'd stroke work up.  Ortho also consulted and planning to fix hip, patient on schedule for OR tomorrow at the moment.  Thankfully patient has no neurologic symptoms at the time of my evaluation in ED.   Review of Systems: As per HPI otherwise 10 point review of systems negative.   Past Medical History:  Diagnosis Date  . CAD (coronary artery disease)   . Chronic kidney disease    stones  . GERD (gastroesophageal reflux disease)   . History of gout 09/28/2014   2014  . Hyperlipidemia   . Hypertension   . Osteoporosis   . Stroke [facility])    TIA    Past Surgical History:  Procedure Laterality Date  . CARDIAC VALVE REPLACEMENT  2009   pig valve -- AVR  . CORONARY ARTERY BYPASS GRAFT     2009  . HERNIA REPAIR       reports that he has quit smoking. He has never used smokeless tobacco. He reports that he does not drink alcohol or use drugs.  Allergies  Allergen Reactions  . Gabapentin     jumpy    Family History  Problem Relation Age of Onset  . Cancer Mother 2       breast ca  . Heart disease Father        CAD     Prior to Admission medications   Medication Sig Start Date End Date  Taking? Authorizing Provider  acetaminophen (TYLENOL) 500 MG tablet Take 1,000 mg by mouth every 6 (six) hours as needed for mild pain.   Yes [provider]  allopurinol (ZYLOPRIM) 100 MG tablet Take 1 tablet (100 mg total) by mouth daily. 06/05/14  Yes Short, Thea Silversmith, MD  aspirin 81 MG tablet Take 81 mg by mouth 2 (two) times daily.    Yes [provider]  atorvastatin (LIPITOR) 40 MG tablet Take 1 tablet (40 mg total) by mouth daily. 06/05/14  Yes Short, Thea Silversmith, MD  carvedilol (COREG) 12.5 MG tablet Take 12.5 mg by mouth 2 (two) times daily with a meal.   Yes [provider]  Cholecalciferol 1000 UNITS tablet Take 1,000 Units by mouth daily.     Yes [provider]  docusate sodium (COLACE) 100 MG capsule Take 200 mg by mouth 2 (two) times daily.   Yes [provider]  finasteride (PROSCAR) 5 MG tablet Take 5 mg by mouth daily.   Yes [provider]  HYDROmorphone (DILAUDID) 4 MG tablet Take 0.5-1 tablets (2-4 mg total) by mouth every 6 (six) hours as needed for severe pain. 09/01/17  Yes Plotnikov, Georgina Quint, MD  lidocaine (LIDODERM) 5 % Place 1 patch onto the skin as needed (for pain). Remove & Discard patch within 12 hours or as directed by MD    Yes [provider]  Magnesium 200 MG TABS 1 po qd Patient taking differently: Take 1 each by mouth daily.  10/23/14  Yes Plotnikov, Georgina Quint, MD  Multiple Vitamins-Minerals (OCUVITE PRESERVISION) TABS Take 2 tablets by mouth 2 (two) times daily.     Yes [provider]  omeprazole (PRILOSEC) 20 MG capsule Take 20 mg by mouth 2 (two) times daily before a meal.    Yes [provider]  predniSONE (DELTASONE) 10 MG tablet 1 po qd Patient taking differently: Take 10 mg by mouth daily with breakfast.  09/01/17  Yes Plotnikov, Georgina Quint, MD  torsemide (DEMADEX) 20 MG tablet Take 1-2 tablets (20-40 mg total) by mouth daily. Patient taking differently: Take 20 mg by mouth daily.   04/24/17  Yes Plotnikov, Georgina Quint, MD  venlafaxine XR (EFFEXOR-XR) 75 MG 24 hr capsule Take 225 mg by mouth daily with breakfast.   Yes [provider]    Physical Exam: Vitals:   08/29/2017 2215 09/25/2017 2230 09/10/2017 2256 09/03/2017 2330  BP: (!) 180/93 (!) 177/96  (!) 175/99  Pulse: (!) 104 (!) 109  (!) 106  Resp: 20 20  (!) 24  Temp:   97.9 F (36.6 C)   SpO2: 91% 93%  95%  Weight:        Constitutional: NAD, calm, comfortable Eyes: PERRL, lids and conjunctivae normal ENMT: Mucous membranes are moist. Posterior pharynx clear of any exudate or lesions.Normal dentition.  Neck: normal, supple, no masses, no thyromegaly Respiratory: clear to auscultation bilaterally, no wheezing, no crackles. Normal respiratory effort. No accessory muscle use.  Cardiovascular: Regular rate and rhythm, no murmurs / rubs / gallops. No extremity edema. 2+ pedal pulses. No carotid bruits.  Abdomen: no tenderness, no masses palpated. No hepatosplenomegaly. Bowel sounds positive.  Musculoskeletal: Severe R hip pain Skin: no rashes, lesions, ulcers. No induration Neurologic: CN 2-12 grossly intact. Sensation intact, DTR normal. Strength 5/5 in all 4.  Psychiatric: Normal judgment and insight. Alert and oriented x 3. Normal mood.    Labs on Admission: I have personally reviewed following labs and imaging studies  CBC:  Recent Labs Lab 09/27/2017 2002 09/08/2017 2007  WBC 14.6*  --   NEUTROABS 12.3*  --   HGB 12.1* 12.2*  HCT 37.4* 36.0*  MCV 101.4*  --   PLT 156  --    Basic Metabolic Panel:  Recent Labs Lab 09/14/2017 2002 09/05/2017 2007  NA 140 141  K 5.0 4.8  CL 106 108  CO2 18*  --   GLUCOSE 133* 127*  BUN 42* 42*  CREATININE 1.67* 1.60*  CALCIUM 8.8*  --    GFR: Estimated Creatinine Clearance: 33.5 mL/min (A) (by C-G formula based on SCr of 1.6 mg/dL (H)). Liver Function Tests:  Recent Labs Lab 09/02/2017 2002  AST 25  ALT 19  ALKPHOS 59  BILITOT 0.8  PROT 6.5  ALBUMIN  3.5   No results for input(s): LIPASE, AMYLASE in the last 168 hours. No results for input(s): AMMONIA in the last 168 hours. Coagulation Profile:  Recent Labs Lab 09/26/2017 2002  INR 1.10   Cardiac Enzymes:  Recent Labs Lab 09/17/2017 2002  CKTOTAL 45*   BNP (last 3 results)  Recent Labs  04/13/17 1552  PROBNP 399.0*   HbA1C: No results for input(s): HGBA1C in  the last 72 hours. CBG:  Recent Labs Lab 09/12/2017 2000  GLUCAP 135*   Lipid Profile: No results for input(s): CHOL, HDL, LDLCALC, TRIG, CHOLHDL, LDLDIRECT in the last 72 hours. Thyroid Function Tests: No results for input(s): TSH, T4TOTAL, FREET4, T3FREE, THYROIDAB in the last 72 hours. Anemia Panel: No results for input(s): VITAMINB12, FOLATE, FERRITIN, TIBC, IRON, RETICCTPCT in the last 72 hours. Urine analysis:    Component Value Date/Time   COLORURINE YELLOW 08/27/2017 1730   APPEARANCEUR CLEAR 08/27/2017 1730   LABSPEC 1.020 08/27/2017 1730   PHURINE 5.0 08/27/2017 1730   GLUCOSEU NEGATIVE 08/27/2017 1730   HGBUR NEGATIVE 08/27/2017 1730   BILIRUBINUR NEGATIVE 08/27/2017 1730   KETONESUR NEGATIVE 08/27/2017 1730   PROTEINUR NEGATIVE 06/03/2014 0923   UROBILINOGEN 0.2 08/27/2017 1730   NITRITE NEGATIVE 08/27/2017 1730   LEUKOCYTESUR NEGATIVE 08/27/2017 1730    Radiological Exams on Admission: Ct Angio Head W Or Wo Contrast  Result Date: 09/02/2017 CLINICAL DATA:  Initial evaluation for acute aphasia. EXAM: CT ANGIOGRAPHY HEAD AND NECK TECHNIQUE: Multidetector CT imaging of the head and neck was performed using the standard protocol during bolus administration of intravenous contrast. Multiplanar CT image reconstructions and MIPs were obtained to evaluate the vascular anatomy. Carotid stenosis measurements (when applicable) are obtained utilizing NASCET criteria, using the distal internal carotid diameter as the denominator. CONTRAST:  50 cc of Isovue 370. COMPARISON:  Prior CT from earlier same day.  FINDINGS: CTA NECK FINDINGS Aortic arch: Visualized aortic arch of normal caliber with with normal branch pattern. Scattered atheromatous plaque present about the arch itself as well as the origin of the great vessels without flow-limiting stenosis. Visualized subclavian artery is widely patent. Right carotid system: Right common carotid artery patent from its origin to the bifurcation. Calcified noncalcified plaque about the right bifurcation/proximal right ICA. Associated stenosis of up to 70% by NASCET criteria. Right ICA patent distally to the skullbase without stenosis. Left carotid system: Left common carotid artery tortuous proximally but widely patent to the bifurcation. Calcified plaque about the left bifurcation without flow-limiting stenosis. Left ICA patent distally to the skullbase without stenosis or occlusion. Vertebral arteries: Both vertebral arteries arise from the subclavian arteries. Vertebral artery's tortuous with scattered atheromatous irregularity without high-grade flow-limiting stenosis. Vertebral arteries are patent to the skullbase. Skeleton: No acute osseus abnormality. No worrisome lytic or blastic osseous lesions. Advanced degenerative spondylolysis throughout the cervical spine. Grade 1 anterolisthesis of C2 on C3 and C7 on T1. Other neck: No acute soft tissue abnormality within the neck. Salivary glands demonstrate no acute abnormality. Thyroid within normal limits. No adenopathy. Upper chest: Circumferential wall thickening about the visualized upper esophagus. Visualized upper mediastinum otherwise within normal limits. Partially visualized lungs are clear. Review of the MIP images confirms the above findings CTA HEAD FINDINGS Anterior circulation: Petrous segments widely patent bilaterally. Smooth atheromatous plaque throughout the cavernous/ supraclinoid ICAs with moderate diffuse narrowing. Changes most notable at the supraclinoid right ICA. ICA termini patent bilaterally. A1  segments irregular but patent without stenosis. Patent anterior communicating artery. Multifocal atheromatous irregularity within the A2 segments bilaterally, left greater than right, without high-grade stenosis. ACAs are patent to their distal aspects. Right M1 segment somewhat irregular but patent to its distal aspect without flow-limiting stenosis. Right MCA bifurcation normal. Proximal M2 stenoses and irregularity without occlusion. MCA branches demonstrate extensive atheromatous irregularity but are perfused to their distal aspects. Left M1 segment patent without stenosis or occlusion. Proximal and 2 stenoses and irregularity without occlusion.  Distal left MCA branches are extensively irregular but patent to their distal aspects. Posterior circulation: Multifocal atheromatous plaque within the bilateral V4 segments with mild to moderate multifocal narrowing. Left vertebral artery dominant. Posterior inferior cerebral arteries are patent proximally. Basilar artery irregular but patent to its distal aspect without high-grade stenosis. Superior cerebral arteries patent bilaterally. Right PCA supplied via the basilar and is widely patent to its distal aspect with moderate atheromatous irregularity. Fetal type origin of the left PCA supplied via a patent left posterior communicating artery. Left PCA also patent to its distal aspect without flow-limiting stenosis with moderate multifocal atheromatous irregularity. Venous sinuses: Not well evaluated due to arterial timing of the contrast bolus. Anatomic variants: Fetal type origin of the left PCA. No other significant. Delayed phase: Not performed. Review of the MIP images confirms the above findings IMPRESSION: 1. Negative CTA for emergent large vessel occlusion. 2. Atheromatous plaque at the right carotid bifurcation with associated stenosis of up to 70% by NASCET criteria. 3. Additional extensive atheromatous disease involving the medium and small vessels of the  head and neck as above. No other high-grade or correctable stenosis identified. 4. Diffuse esophageal wall thickening about the visualized upper esophagus. Considerations include sequelae of reflux disease or possibly acute esophagitis. Critical Value/emergent results were called by telephone at the time of interpretation on 2017-10-18 at 8:31 pm to Dr. Noel Christmas , who verbally acknowledged these results. Electronically Signed   By: Rise Mu M.D.   On: 10-18-2017 21:07   Dg Chest 1 View  Result Date: 18-Oct-2017 CLINICAL DATA:  Fall with pain. EXAM: CHEST 1 VIEW COMPARISON:  04/13/2017 FINDINGS: Lungs are adequately inflated with mild elevation of the left hemidiaphragm unchanged. No focal lobar consolidation or effusion. No pneumothorax. Mild stable cardiomegaly. Calcified plaque over the aortic arch. Remainder of the exam is unchanged. IMPRESSION: No acute cardiopulmonary disease. Stable cardiomegaly. Aortic Atherosclerosis (ICD10-I70.0). Electronically Signed   By: Elberta Fortis M.D.   On: Oct 18, 2017 21:47   Dg Elbow Complete Right (3+view)  Result Date: October 18, 2017 CLINICAL DATA:  Fall with right elbow pain. EXAM: RIGHT ELBOW - COMPLETE 3+ VIEW COMPARISON:  None. FINDINGS: Lateral film is not optimal. No evidence of acute fracture or dislocation. No significant joint effusion. IMPRESSION: No acute findings. Electronically Signed   By: Elberta Fortis M.D.   On: October 18, 2017 21:49   Ct Angio Neck W Or Wo Contrast  Result Date: 10-18-17 CLINICAL DATA:  Initial evaluation for acute aphasia. EXAM: CT ANGIOGRAPHY HEAD AND NECK TECHNIQUE: Multidetector CT imaging of the head and neck was performed using the standard protocol during bolus administration of intravenous contrast. Multiplanar CT image reconstructions and MIPs were obtained to evaluate the vascular anatomy. Carotid stenosis measurements (when applicable) are obtained utilizing NASCET criteria, using the distal internal carotid  diameter as the denominator. CONTRAST:  50 cc of Isovue 370. COMPARISON:  Prior CT from earlier same day. FINDINGS: CTA NECK FINDINGS Aortic arch: Visualized aortic arch of normal caliber with with normal branch pattern. Scattered atheromatous plaque present about the arch itself as well as the origin of the great vessels without flow-limiting stenosis. Visualized subclavian artery is widely patent. Right carotid system: Right common carotid artery patent from its origin to the bifurcation. Calcified noncalcified plaque about the right bifurcation/proximal right ICA. Associated stenosis of up to 70% by NASCET criteria. Right ICA patent distally to the skullbase without stenosis. Left carotid system: Left common carotid artery tortuous proximally but widely patent to the bifurcation.  Calcified plaque about the left bifurcation without flow-limiting stenosis. Left ICA patent distally to the skullbase without stenosis or occlusion. Vertebral arteries: Both vertebral arteries arise from the subclavian arteries. Vertebral artery's tortuous with scattered atheromatous irregularity without high-grade flow-limiting stenosis. Vertebral arteries are patent to the skullbase. Skeleton: No acute osseus abnormality. No worrisome lytic or blastic osseous lesions. Advanced degenerative spondylolysis throughout the cervical spine. Grade 1 anterolisthesis of C2 on C3 and C7 on T1. Other neck: No acute soft tissue abnormality within the neck. Salivary glands demonstrate no acute abnormality. Thyroid within normal limits. No adenopathy. Upper chest: Circumferential wall thickening about the visualized upper esophagus. Visualized upper mediastinum otherwise within normal limits. Partially visualized lungs are clear. Review of the MIP images confirms the above findings CTA HEAD FINDINGS Anterior circulation: Petrous segments widely patent bilaterally. Smooth atheromatous plaque throughout the cavernous/ supraclinoid ICAs with moderate  diffuse narrowing. Changes most notable at the supraclinoid right ICA. ICA termini patent bilaterally. A1 segments irregular but patent without stenosis. Patent anterior communicating artery. Multifocal atheromatous irregularity within the A2 segments bilaterally, left greater than right, without high-grade stenosis. ACAs are patent to their distal aspects. Right M1 segment somewhat irregular but patent to its distal aspect without flow-limiting stenosis. Right MCA bifurcation normal. Proximal M2 stenoses and irregularity without occlusion. MCA branches demonstrate extensive atheromatous irregularity but are perfused to their distal aspects. Left M1 segment patent without stenosis or occlusion. Proximal and 2 stenoses and irregularity without occlusion. Distal left MCA branches are extensively irregular but patent to their distal aspects. Posterior circulation: Multifocal atheromatous plaque within the bilateral V4 segments with mild to moderate multifocal narrowing. Left vertebral artery dominant. Posterior inferior cerebral arteries are patent proximally. Basilar artery irregular but patent to its distal aspect without high-grade stenosis. Superior cerebral arteries patent bilaterally. Right PCA supplied via the basilar and is widely patent to its distal aspect with moderate atheromatous irregularity. Fetal type origin of the left PCA supplied via a patent left posterior communicating artery. Left PCA also patent to its distal aspect without flow-limiting stenosis with moderate multifocal atheromatous irregularity. Venous sinuses: Not well evaluated due to arterial timing of the contrast bolus. Anatomic variants: Fetal type origin of the left PCA. No other significant. Delayed phase: Not performed. Review of the MIP images confirms the above findings IMPRESSION: 1. Negative CTA for emergent large vessel occlusion. 2. Atheromatous plaque at the right carotid bifurcation with associated stenosis of up to 70% by  NASCET criteria. 3. Additional extensive atheromatous disease involving the medium and small vessels of the head and neck as above. No other high-grade or correctable stenosis identified. 4. Diffuse esophageal wall thickening about the visualized upper esophagus. Considerations include sequelae of reflux disease or possibly acute esophagitis. Critical Value/emergent results were called by telephone at the time of interpretation on 09/21/2017 at 8:31 pm to Dr. Noel Christmas , who verbally acknowledged these results. Electronically Signed   By: Rise Mu M.D.   On: 09/11/2017 21:07   Dg Humerus Right  Result Date: 09/20/2017 CLINICAL DATA:  Fall with pain. EXAM: RIGHT HUMERUS - 2+ VIEW COMPARISON:  None. FINDINGS: Diffuse osteopenia. Mild degenerate change of the glenohumeral joint and AC joint. No acute fracture or dislocation. IMPRESSION: No acute findings. Electronically Signed   By: Elberta Fortis M.D.   On: 09/04/2017 21:48   Dg Hip Unilat With Pelvis 2-3 Views Right  Result Date: 09/20/2017 CLINICAL DATA:  Fall with pain. EXAM: DG HIP (WITH OR WITHOUT PELVIS) 2-3V RIGHT  COMPARISON:  None. FINDINGS: There is diffuse osteopenia. There mild degenerate changes of the hips. There is a displaced fracture of the right femoral neck likely subcapital in location. Superior displacement of the distal fragment. Hardware over the lumbosacral spine. Several pelvic phleboliths. IMPRESSION: Displaced right femoral neck fracture. Electronically Signed   By: Elberta Fortis M.D.   On: 10/27/2017 21:46   Ct Head Code Stroke Wo Contrast  Result Date: September 30, 2017 CLINICAL DATA:  Code stroke. Initial evaluation for acute aphasia. Weakness. EXAM: CT HEAD WITHOUT CONTRAST TECHNIQUE: Contiguous axial images were obtained from the base of the skull through the vertex without intravenous contrast. COMPARISON:  Prior MRI from 06/04/2014. FINDINGS: Brain: Generalized age related cerebral atrophy with chronic small vessel  ischemic disease. Patchy 13 mm hypodensity within the inferior left cerebellar hemisphere, suspicious for evolving acute ischemic infarct (series 3, image 5). No other definite acute large vessel territory infarct. No acute intracranial hemorrhage. No mass lesion or midline shift. No hydrocephalus. No extra-axial fluid collection. Vascular: Question asymmetric hyperdensity involving the right M1 segment. Prominent vascular calcifications within the carotid siphons. Skull: Scalp soft tissues and calvarium within normal limits. Sinuses/Orbits: Globes and orbital soft tissues normal. Patient status post lens extraction bilaterally. Chronic right sphenoid sinus disease. Paranasal sinuses otherwise clear. No mastoid effusion. Other: None. ASPECTS [facility] Stroke Program Early CT Score) - Ganglionic level infarction (caudate, lentiform nuclei, internal capsule, insula, M1-M3 cortex): 7 - Supraganglionic infarction (M4-M6 cortex): 3 Total score (0-10 with 10 being normal): 10 IMPRESSION: 1. Question asymmetric hyperdensity within the right M1 segment/dense M1, which may reflect intravascular thrombus. Further evaluation with CTA recommended. No definite evidence for evolving left MCA territory ischemia. No acute intracranial hemorrhage. 2. ASPECTS is 10 3. Patchy hypodensity within the inferior left cerebellar hemisphere, suspicious for evolving acute left PICA territory infarct. 4. Atrophy with chronic small vessel ischemic disease. Critical Value/emergent results were called by telephone at the time of interpretation on September 30, 2017 at 8:31 pm to Dr. Roseanne Reno , who verbally acknowledged these results. Electronically Signed   By: Rise Mu M.D.   On: 10/12/2017 20:31    EKG: Independently reviewed.  Assessment/Plan Principal Problem:   Closed right hip fracture, initial encounter (HCC) Active Problems:   TIA (transient ischemic attack)   Current chronic use of systemic steroids   HTN (hypertension)     1. Closed R hip fx - 1. Hip fx pathway 2. NPO after mn 3. Pain control with IV dilaudid PRN (takes chronic PO dilaudid at home for back pain). 4. Spoke with Dr. Roseanne Reno: we recognize that failing to treat / delaying treatment of hip fx can have dire (and even fatal) consequences on this 83 yo M who is still living independently at home and walking with a walker at baseline.  Dr. Roseanne Reno did recommend proceeding with surgery despite possible acute TIA / stroke. 1. Did extensively discuss the above with his son as well who was at bedside. 2. TIA - 1. Stroke pathway 2. MRI brain to see if any evidence of acute stroke 3. Remainder of stroke work up ordered 3. HTN -  1. Holding home BP meds to allow permissive HTN in setting of acute TIA with improved / resolved neurologic symptoms in ED 4. Current chronic use of steroids - 1. Continue prednisone  PO daily 2. Given SBPs 170s-180s at the moment, does not appear to need stress dose steroids for now.  DVT prophylaxis: SCDs Code Status: Full Family Communication: Son at bedside Disposition  Plan: Rehab after admit Consults called: Neurology and ortho Admission status: Admit to inpatient   Hillary Bow DO [facility] Pager [phone]  If 7AM-7PM, please contact day team taking care of patient [email or web address] Password [email or web address]  September 23, 2017, 11:50 PM

## 2017-09-18 NOTE — ED Notes (Signed)
Patient transported to X-ray 

## 2017-09-18 NOTE — ED Notes (Signed)
Lab to add on CK 

## 2017-09-19 ENCOUNTER — Inpatient Hospital Stay (HOSPITAL_COMMUNITY): Payer: Medicare Other | Admitting: Certified Registered"

## 2017-09-19 ENCOUNTER — Inpatient Hospital Stay (HOSPITAL_COMMUNITY): Payer: Medicare Other

## 2017-09-19 ENCOUNTER — Encounter (HOSPITAL_COMMUNITY): Admission: EM | Disposition: E | Payer: Self-pay | Source: Home / Self Care | Attending: Internal Medicine

## 2017-09-19 DIAGNOSIS — I951 Orthostatic hypotension: Secondary | ICD-10-CM

## 2017-09-19 DIAGNOSIS — R55 Syncope and collapse: Secondary | ICD-10-CM

## 2017-09-19 DIAGNOSIS — I10 Essential (primary) hypertension: Secondary | ICD-10-CM | POA: Diagnosis present

## 2017-09-19 HISTORY — PX: HIP ARTHROPLASTY: SHX981

## 2017-09-19 LAB — BLOOD GAS, ARTERIAL
ACID-BASE DEFICIT: 14.4 mmol/L — AB (ref 0.0–2.0)
BICARBONATE: 13 mmol/L — AB (ref 20.0–28.0)
DRAWN BY: 252031
FIO2: 100
O2 Saturation: 95.1 %
PEEP/CPAP: 5 cmH2O
Patient temperature: 98.6
RATE: 20 resp/min
VT: 590 mL
pCO2 arterial: 40.5 mmHg (ref 32.0–48.0)
pH, Arterial: 7.133 — CL (ref 7.350–7.450)
pO2, Arterial: 105 mmHg (ref 83.0–108.0)

## 2017-09-19 LAB — LIPID PANEL
CHOL/HDL RATIO: 2.4 ratio
Cholesterol: 133 mg/dL (ref 0–200)
HDL: 56 mg/dL (ref >40)
LDL CALC: 49 mg/dL (ref 0–99)
Triglycerides: 138 mg/dL (ref <150)
VLDL: 28 mg/dL (ref 0–40)

## 2017-09-19 LAB — CBC
HEMATOCRIT: 32.6 % — AB (ref 39.0–52.0)
HEMOGLOBIN: 10.3 g/dL — AB (ref 13.0–17.0)
MCH: 31.9 pg (ref 26.0–34.0)
MCHC: 31.6 g/dL (ref 30.0–36.0)
MCV: 100.9 fL — AB (ref 78.0–100.0)
PLATELETS: 117 10*3/uL — AB (ref 150–400)
RBC: 3.23 MIL/uL — AB (ref 4.22–5.81)
RDW: 13.9 % (ref 11.5–15.5)
WBC: 22.1 10*3/uL — AB (ref 4.0–10.5)

## 2017-09-19 LAB — BASIC METABOLIC PANEL
Anion gap: 10 (ref 5–15)
BUN: 38 mg/dL — AB (ref 6–20)
CALCIUM: 7.8 mg/dL — AB (ref 8.9–10.3)
CO2: 20 mmol/L — AB (ref 22–32)
CREATININE: 1.8 mg/dL — AB (ref 0.61–1.24)
Chloride: 111 mmol/L (ref 101–111)
GFR calc Af Amer: 37 mL/min — ABNORMAL LOW (ref >60)
GFR, EST NON AFRICAN AMERICAN: 32 mL/min — AB (ref >60)
GLUCOSE: 113 mg/dL — AB (ref 65–99)
Potassium: 4.6 mmol/L (ref 3.5–5.1)
Sodium: 141 mmol/L (ref 135–145)

## 2017-09-19 LAB — LACTIC ACID, PLASMA: LACTIC ACID, VENOUS: 6.5 mmol/L — AB (ref 0.5–1.9)

## 2017-09-19 LAB — HEMOGLOBIN A1C
Hgb A1c MFr Bld: 5.9 % — ABNORMAL HIGH (ref 4.8–5.6)
MEAN PLASMA GLUCOSE: 122.63 mg/dL

## 2017-09-19 LAB — MAGNESIUM: Magnesium: 1.4 mg/dL — ABNORMAL LOW (ref 1.7–2.4)

## 2017-09-19 SURGERY — HEMIARTHROPLASTY, HIP, DIRECT ANTERIOR APPROACH, FOR FRACTURE
Anesthesia: General | Site: Hip | Laterality: Right

## 2017-09-19 MED ORDER — ACETAMINOPHEN 500 MG PO TABS
500.0000 mg | ORAL_TABLET | Freq: Three times a day (TID) | ORAL | Status: DC
  Administered 2017-09-19: 500 mg via ORAL
  Filled 2017-09-19 (×2): qty 1

## 2017-09-19 MED ORDER — PROPOFOL 10 MG/ML IV BOLUS
INTRAVENOUS | Status: AC
  Filled 2017-09-19: qty 20

## 2017-09-19 MED ORDER — LIDOCAINE 2% (20 MG/ML) 5 ML SYRINGE
INTRAMUSCULAR | Status: AC
  Filled 2017-09-19: qty 5

## 2017-09-19 MED ORDER — CEFAZOLIN SODIUM-DEXTROSE 2-4 GM/100ML-% IV SOLN
2.0000 g | Freq: Three times a day (TID) | INTRAVENOUS | Status: AC
  Administered 2017-09-19 – 2017-09-20 (×2): 2 g via INTRAVENOUS
  Filled 2017-09-19 (×4): qty 100

## 2017-09-19 MED ORDER — METOCLOPRAMIDE HCL 5 MG PO TABS
5.0000 mg | ORAL_TABLET | Freq: Three times a day (TID) | ORAL | Status: DC | PRN
Start: 2017-09-19 — End: 2017-09-20

## 2017-09-19 MED ORDER — SUGAMMADEX SODIUM 200 MG/2ML IV SOLN
INTRAVENOUS | Status: AC
  Filled 2017-09-19: qty 2

## 2017-09-19 MED ORDER — ACETAMINOPHEN 650 MG RE SUPP: 650.0000 mg | Freq: Four times a day (QID) | RECTAL | Status: DC | PRN

## 2017-09-19 MED ORDER — HYDROCODONE-ACETAMINOPHEN 5-325 MG PO TABS: 1.0000 | ORAL_TABLET | Freq: Four times a day (QID) | ORAL | Status: DC | PRN

## 2017-09-19 MED ORDER — ONDANSETRON HCL 4 MG/2ML IJ SOLN: 4.0000 mg | Freq: Four times a day (QID) | INTRAMUSCULAR | Status: DC | PRN

## 2017-09-19 MED ORDER — METOCLOPRAMIDE HCL 5 MG/ML IJ SOLN
5.0000 mg | Freq: Three times a day (TID) | INTRAMUSCULAR | Status: DC | PRN
  Filled 2017-09-19: qty 2

## 2017-09-19 MED ORDER — ACETAMINOPHEN 500 MG PO TABS: 500.0000 mg | ORAL_TABLET | Freq: Four times a day (QID) | ORAL | Status: DC | PRN

## 2017-09-19 MED ORDER — PHENYLEPHRINE HCL 10 MG/ML IJ SOLN
INTRAVENOUS | Status: DC | PRN
  Administered 2017-09-19: 50 ug/min via INTRAVENOUS

## 2017-09-19 MED ORDER — INSULIN ASPART 100 UNIT/ML ~~LOC~~ SOLN
2.0000 [IU] | SUBCUTANEOUS | Status: DC
  Administered 2017-09-20 (×2): 2 [IU] via SUBCUTANEOUS
  Administered 2017-09-21: 4 [IU] via SUBCUTANEOUS
  Administered 2017-09-21 – 2017-09-22 (×5): 2 [IU] via SUBCUTANEOUS
  Administered 2017-09-22: 4 [IU] via SUBCUTANEOUS
  Administered 2017-09-22 – 2017-09-23 (×4): 2 [IU] via SUBCUTANEOUS
  Administered 2017-09-23: 4 [IU] via SUBCUTANEOUS
  Administered 2017-09-23: 2 [IU] via SUBCUTANEOUS
  Administered 2017-09-23: 4 [IU] via SUBCUTANEOUS

## 2017-09-19 MED ORDER — PHENOL 1.4 % MT LIQD: 1.0000 | OROMUCOSAL | Status: DC | PRN

## 2017-09-19 MED ORDER — CEFAZOLIN SODIUM-DEXTROSE 2-3 GM-% IV SOLR
INTRAVENOUS | Status: DC | PRN
  Administered 2017-09-19: 2 g via INTRAVENOUS

## 2017-09-19 MED ORDER — MIDAZOLAM HCL 2 MG/2ML IJ SOLN
1.0000 mg | INTRAMUSCULAR | Status: DC | PRN
  Administered 2017-09-20 (×2): 1 mg via INTRAVENOUS
  Filled 2017-09-19 (×2): qty 2

## 2017-09-19 MED ORDER — LIDOCAINE HCL (CARDIAC) 20 MG/ML IV SOLN
INTRAVENOUS | Status: DC | PRN
  Administered 2017-09-19: 60 mg via INTRAVENOUS

## 2017-09-19 MED ORDER — ROCURONIUM BROMIDE 100 MG/10ML IV SOLN
INTRAVENOUS | Status: DC | PRN
  Administered 2017-09-19: 50 mg via INTRAVENOUS

## 2017-09-19 MED ORDER — ACETAMINOPHEN 500 MG PO TABS
500.0000 mg | ORAL_TABLET | Freq: Three times a day (TID) | ORAL | Status: DC
  Administered 2017-09-20 (×3): 500 mg
  Filled 2017-09-19 (×5): qty 1

## 2017-09-19 MED ORDER — FENTANYL CITRATE (PF) 100 MCG/2ML IJ SOLN
INTRAMUSCULAR | Status: AC
  Administered 2017-09-19: 25 ug via INTRAVENOUS
  Filled 2017-09-19: qty 2

## 2017-09-19 MED ORDER — 0.9 % SODIUM CHLORIDE (POUR BTL) OPTIME
TOPICAL | Status: DC | PRN
  Administered 2017-09-19: 1000 mL

## 2017-09-19 MED ORDER — METOPROLOL TARTRATE 5 MG/5ML IV SOLN
2.5000 mg | Freq: Once | INTRAVENOUS | Status: AC
  Administered 2017-09-19: 2.5 mg via INTRAVENOUS
  Filled 2017-09-19: qty 5

## 2017-09-19 MED ORDER — FENTANYL CITRATE (PF) 100 MCG/2ML IJ SOLN
INTRAMUSCULAR | Status: AC
  Administered 2017-09-19: 22:00:00
  Filled 2017-09-19: qty 2

## 2017-09-19 MED ORDER — VANCOMYCIN HCL 1000 MG IV SOLR
INTRAVENOUS | Status: DC | PRN
  Administered 2017-09-19: 1000 mg via TOPICAL

## 2017-09-19 MED ORDER — MAGNESIUM SULFATE 4 GM/100ML IV SOLN
4.0000 g | Freq: Once | INTRAVENOUS | Status: AC
  Administered 2017-09-19: 4 g via INTRAVENOUS
  Filled 2017-09-19: qty 100

## 2017-09-19 MED ORDER — NOREPINEPHRINE BITARTRATE 1 MG/ML IV SOLN
0.0000 ug/min | INTRAVENOUS | Status: DC
  Administered 2017-09-19: 15 ug/min via INTRAVENOUS
  Administered 2017-09-20: 5 ug/min via INTRAVENOUS
  Administered 2017-09-20: 10 ug/min via INTRAVENOUS
  Administered 2017-09-21: 5 ug/min via INTRAVENOUS
  Administered 2017-09-21: 8 ug/min via INTRAVENOUS
  Filled 2017-09-19 (×6): qty 4

## 2017-09-19 MED ORDER — FENTANYL CITRATE (PF) 100 MCG/2ML IJ SOLN
25.0000 ug | INTRAMUSCULAR | Status: DC | PRN
  Administered 2017-09-19: 25 ug via INTRAVENOUS

## 2017-09-19 MED ORDER — SUCCINYLCHOLINE CHLORIDE 20 MG/ML IJ SOLN
INTRAMUSCULAR | Status: DC | PRN
  Administered 2017-09-19: 100 mg via INTRAVENOUS

## 2017-09-19 MED ORDER — ASPIRIN 81 MG PO CHEW
81.0000 mg | CHEWABLE_TABLET | Freq: Two times a day (BID) | ORAL | Status: DC
  Administered 2017-09-20 – 2017-09-23 (×7): 81 mg
  Filled 2017-09-19 (×7): qty 1

## 2017-09-19 MED ORDER — SUGAMMADEX SODIUM 200 MG/2ML IV SOLN
INTRAVENOUS | Status: DC | PRN
  Administered 2017-09-19: 160 mg via INTRAVENOUS

## 2017-09-19 MED ORDER — SODIUM CHLORIDE 0.9 % IR SOLN
Status: DC | PRN
  Administered 2017-09-19: 3000 mL

## 2017-09-19 MED ORDER — MIDAZOLAM HCL 2 MG/2ML IJ SOLN
INTRAMUSCULAR | Status: AC
  Administered 2017-09-19: 22:00:00
  Filled 2017-09-19: qty 2

## 2017-09-19 MED ORDER — PHENYLEPHRINE HCL 10 MG/ML IJ SOLN
INTRAMUSCULAR | Status: DC | PRN
  Administered 2017-09-19: 80 ug via INTRAVENOUS
  Administered 2017-09-19: 200 ug via INTRAVENOUS
  Administered 2017-09-19: 160 ug via INTRAVENOUS
  Administered 2017-09-19: 40 ug via INTRAVENOUS
  Administered 2017-09-19 (×3): 120 ug via INTRAVENOUS
  Administered 2017-09-19: 80 ug via INTRAVENOUS

## 2017-09-19 MED ORDER — ATORVASTATIN CALCIUM 40 MG PO TABS
40.0000 mg | ORAL_TABLET | Freq: Every day | ORAL | Status: DC
  Administered 2017-09-20 – 2017-09-23 (×4): 40 mg
  Filled 2017-09-19 (×4): qty 1

## 2017-09-19 MED ORDER — MENTHOL 3 MG MT LOZG
1.0000 | LOZENGE | OROMUCOSAL | Status: DC | PRN
  Filled 2017-09-19: qty 9

## 2017-09-19 MED ORDER — FENTANYL 2500MCG IN NS 250ML (10MCG/ML) PREMIX INFUSION
25.0000 ug/h | INTRAVENOUS | Status: DC
  Administered 2017-09-19: 50 ug/h via INTRAVENOUS
  Administered 2017-09-20 (×2): 200 ug/h via INTRAVENOUS
  Administered 2017-09-21: 175 ug/h via INTRAVENOUS
  Administered 2017-09-21: 200 ug/h via INTRAVENOUS
  Administered 2017-09-22: 50 ug/h via INTRAVENOUS
  Administered 2017-09-22: 175 ug/h via INTRAVENOUS
  Filled 2017-09-19 (×7): qty 250

## 2017-09-19 MED ORDER — ONDANSETRON HCL 4 MG PO TABS
4.0000 mg | ORAL_TABLET | Freq: Four times a day (QID) | ORAL | Status: DC | PRN
Start: 2017-09-19 — End: 2017-09-29

## 2017-09-19 MED ORDER — ALLOPURINOL 100 MG PO TABS
100.0000 mg | ORAL_TABLET | Freq: Every day | ORAL | Status: DC
  Administered 2017-09-20 – 2017-09-23 (×4): 100 mg
  Filled 2017-09-19 (×4): qty 1

## 2017-09-19 MED ORDER — ONDANSETRON HCL 4 MG/2ML IJ SOLN
INTRAMUSCULAR | Status: AC
Start: 2017-09-19 — End: 2017-09-19
  Filled 2017-09-19: qty 2

## 2017-09-19 MED ORDER — DOCUSATE SODIUM 50 MG/5ML PO LIQD: 100.0000 mg | Freq: Two times a day (BID) | ORAL | Status: DC | PRN

## 2017-09-19 MED ORDER — ETOMIDATE 2 MG/ML IV SOLN
INTRAVENOUS | Status: DC | PRN
  Administered 2017-09-19: 14 mg via INTRAVENOUS

## 2017-09-19 MED ORDER — EPHEDRINE SULFATE 50 MG/ML IJ SOLN
INTRAMUSCULAR | Status: DC | PRN
  Administered 2017-09-19: 10 mg via INTRAVENOUS

## 2017-09-19 MED ORDER — ACETAMINOPHEN 325 MG PO TABS
650.0000 mg | ORAL_TABLET | Freq: Four times a day (QID) | ORAL | Status: DC | PRN
  Administered 2017-09-24 (×2): 650 mg via ORAL
  Filled 2017-09-19 (×2): qty 2

## 2017-09-19 MED ORDER — FENTANYL CITRATE (PF) 250 MCG/5ML IJ SOLN
INTRAMUSCULAR | Status: AC
  Filled 2017-09-19: qty 5

## 2017-09-19 MED ORDER — DOCUSATE SODIUM 100 MG PO CAPS
100.0000 mg | ORAL_CAPSULE | Freq: Two times a day (BID) | ORAL | Status: DC
  Filled 2017-09-19 (×5): qty 1

## 2017-09-19 MED ORDER — VANCOMYCIN HCL 1000 MG IV SOLR
INTRAVENOUS | Status: AC
  Filled 2017-09-19: qty 1000

## 2017-09-19 MED ORDER — FENTANYL CITRATE (PF) 100 MCG/2ML IJ SOLN
INTRAMUSCULAR | Status: DC | PRN
  Administered 2017-09-19 (×5): 50 ug via INTRAVENOUS

## 2017-09-19 SURGICAL SUPPLY — 61 items
ADH SKN CLS APL DERMABOND .7 (GAUZE/BANDAGES/DRESSINGS) ×1
BLADE SAW SAG 73X25 THK (BLADE) ×2
BLADE SAW SGTL 73X25 THK (BLADE) ×1 IMPLANT
BNDG GAUZE ELAST 4 BULKY (GAUZE/BANDAGES/DRESSINGS) ×2 IMPLANT
BRUSH FEMORAL CANAL (MISCELLANEOUS) ×2 IMPLANT
BRUSH SCRUB SURG 4.25 DISP (MISCELLANEOUS) ×6 IMPLANT
CAPT HIP HEMI 2 ×3 IMPLANT
CEMENT BONE SIMPLEX SPEEDSET (Cement) ×4 IMPLANT
CEMENT RESTRICTOR DEPUY SZ 3 (Cement) ×2 IMPLANT
CHLORAPREP W/TINT 26ML (MISCELLANEOUS) ×3 IMPLANT
COVER SURGICAL LIGHT HANDLE (MISCELLANEOUS) ×5 IMPLANT
DERMABOND ADVANCED (GAUZE/BANDAGES/DRESSINGS) ×2
DERMABOND ADVANCED .7 DNX12 (GAUZE/BANDAGES/DRESSINGS) IMPLANT
DRAPE INCISE IOBAN 85X60 (DRAPES) ×5 IMPLANT
DRAPE ORTHO SPLIT 77X108 STRL (DRAPES) ×6
DRAPE SURG ORHT 6 SPLT 77X108 (DRAPES) ×2 IMPLANT
DRAPE U-SHAPE 47X51 STRL (DRAPES) ×5 IMPLANT
DRILL BIT 7/64X5 (BIT) ×1 IMPLANT
DRSG MEPILEX BORDER 4X4 (GAUZE/BANDAGES/DRESSINGS) ×2 IMPLANT
DRSG MEPILEX BORDER 4X8 (GAUZE/BANDAGES/DRESSINGS) ×3 IMPLANT
ELECT BLADE 6.5 EXT (BLADE) ×2 IMPLANT
ELECT CAUTERY BLADE 6.4 (BLADE) ×2 IMPLANT
ELECT REM PT RETURN 9FT ADLT (ELECTROSURGICAL) ×3
ELECTRODE REM PT RTRN 9FT ADLT (ELECTROSURGICAL) ×1 IMPLANT
GAUZE SPONGE 4X4 12PLY STRL LF (GAUZE/BANDAGES/DRESSINGS) ×2 IMPLANT
GAUZE XEROFORM 5X9 LF (GAUZE/BANDAGES/DRESSINGS) ×2 IMPLANT
GLOVE BIO SURGEON STRL SZ7.5 (GLOVE) ×12 IMPLANT
GLOVE BIOGEL PI IND STRL 7.5 (GLOVE) ×1 IMPLANT
GLOVE BIOGEL PI INDICATOR 7.5 (GLOVE) ×4
GOWN STRL REUS W/ TWL LRG LVL3 (GOWN DISPOSABLE) ×2 IMPLANT
GOWN STRL REUS W/TWL LRG LVL3 (GOWN DISPOSABLE) ×9
HANDPIECE INTERPULSE COAX TIP (DISPOSABLE) ×3
HIP CAPITATED HEMI 2 IMPLANT
HOOD PEEL AWAY FACE SHEILD DIS (HOOD) ×2 IMPLANT
KIT BASIN OR (CUSTOM PROCEDURE TRAY) ×3 IMPLANT
KIT ROOM TURNOVER OR (KITS) ×3 IMPLANT
MANIFOLD NEPTUNE II (INSTRUMENTS) ×3 IMPLANT
NDL 1/2 CIR MAYO (NEEDLE) IMPLANT
NEEDLE 1/2 CIR MAYO (NEEDLE) ×3 IMPLANT
NS IRRIG 1000ML POUR BTL (IV SOLUTION) ×3 IMPLANT
PACK TOTAL JOINT (CUSTOM PROCEDURE TRAY) ×3 IMPLANT
PAD ARMBOARD 7.5X6 YLW CONV (MISCELLANEOUS) ×8 IMPLANT
PILLOW ABDUCTION HIP (SOFTGOODS) ×2 IMPLANT
PRESSURIZER FEMORAL UNIV (MISCELLANEOUS) ×2 IMPLANT
RETRIEVER SUT HEWSON (MISCELLANEOUS) ×1 IMPLANT
SET HNDPC FAN SPRY TIP SCT (DISPOSABLE) IMPLANT
STAPLER VISISTAT 35W (STAPLE) ×3 IMPLANT
SUT ETHILON 2 0 PSLX (SUTURE) ×2 IMPLANT
SUT FIBERWIRE #2 38 T-5 BLUE (SUTURE)
SUT MNCRL AB 3-0 PS2 18 (SUTURE) ×3 IMPLANT
SUT MON AB 2-0 CT1 36 (SUTURE) ×1 IMPLANT
SUT VIC AB 1 CT1 27 (SUTURE) ×6
SUT VIC AB 1 CT1 27XBRD ANBCTR (SUTURE) ×1 IMPLANT
SUT VIC AB 1 CTX 18 (SUTURE) ×3 IMPLANT
SUT VIC AB 2-0 CT1 27 (SUTURE) ×6
SUT VIC AB 2-0 CT1 TAPERPNT 27 (SUTURE) ×2 IMPLANT
SUTURE FIBERWR #2 38 T-5 BLUE (SUTURE) ×2 IMPLANT
TOWEL OR 17X24 6PK STRL BLUE (TOWEL DISPOSABLE) ×3 IMPLANT
TOWEL OR 17X26 10 PK STRL BLUE (TOWEL DISPOSABLE) ×3 IMPLANT
TOWER CARTRIDGE SMART MIX (DISPOSABLE) ×2 IMPLANT
WATER STERILE IRR 1000ML POUR (IV SOLUTION) ×3 IMPLANT

## 2017-09-19 NOTE — Op Note (Signed)
OrthopaedicSurgeryOperativeNote (ZOX:[id number]) Date of Surgery: 09/17/2017 - 10-08-2017  Admit Date: 09/12/2017   Diagnoses: Pre-Op Diagnoses: Right displaced femoral neck fracture  Post-Op Diagnosis: None  Procedures: CPT 27236 Right hip hemiarthroplasty  Surgeons: Primary: Roby Lofts, MD   Assistant: Montez Morita, PA-C  Location:[facility] OR ROOM 03   AnesthesiaGeneral   Antibiotics:Ancef 2g preop  Tourniquettime:* No tourniquets in log * .  EstimatedBloodLoss:210mL   Complications:none  Specimens:None  Implants:  Implant Name Type Inv. Item Serial No. Manufacturer Lot No. LRB No. Used Action  SPACER OSTEO CEMENT - [id number] Orthopedic Implant SPACER OSTEO CEMENT  [employer] [id number] Right 1 Implanted  127 degree cemented hip stem.  Stem   [employer] [id number] Right 1 Implanted  CEMENT BONE SIMPLEX SPEEDSET - [id number] Cement CEMENT BONE SIMPLEX SPEEDSET  [employer] [id number] Right 1 Implanted  CEMENT BONE SIMPLEX SPEEDSET - [id number] Cement CEMENT BONE SIMPLEX SPEEDSET  [employer] [id number] Right 1 Implanted  CEMENT RESTRICTOR DEPUY SZ 3 - [id number] Cement CEMENT RESTRICTOR DEPUY SZ 3  DEPUY SYNTHES [id number] Right 1 Implanted  universal head OD 52mm ID 28mm Head   [employer] [id number] Right 1 Implanted  FEMORAL HEAD LFIT V40 P4 - [id number] Head FEMORAL HEAD LFIT V40 P4   [employer] [id number] Right 1 Implanted    IndicationsforSurgery: 83 year old male with history of stroke, hypertension, hyperlipidemia, coronary artery disease, chronic kidney disease and osteoporosis brought to the ED after being found down on his driveway. Patient was noted to have receptive and expressive aphasia. Was evaluated by neurology and had MRI of the brain which did not show an acute stroke. His problems resolved overnight. He had a displaced femoral neck fracture. I felt with his age and functional level that he would benefit from a  hemiarthroplasty. Risks included bleeding, infection, dislocation, periprosthetic fracture, nerve and blood vessel injury, acetabular wear, leg length discrepency PE/DVT, and even death. In light of this they agreed to proceed with surgery.  Operative Findings: Displaced right femoral neck fracture treated with Stryker Accolade-C bipolar cemented hemiarthroplasty with size 4 stem, +4 neck and 52mm head.  Procedure: The patient was identified in the preoperative holding area. Consent was confirmed with the patient and their family and all questions were answered. The operative extremity was marked after confirmation with the patient. They were then brought back to the operating room by our anesthesia colleagues. General anesthesia was induced and they were transferred carefully over to a regular OR table. The patient was placed in the lateral decubitus position with the operative side up. All bony prominences were well padded. The bean bag was inflated. The operative extremity was prepped and draped in usual sterile fashion. A timeout was performed to verify the patient, the procedure and the extremity. Preoperative antibiotics were dosed.  A lateral approach was made. The skin incised and the subcutaneous tissue was incised along the incision. The IT band was split inline with the incision, as well. A Charnley retractor was then placed under the IT band. The hip abductor muscle was identified at its insertion to the greater trochanter. The anterior 1/3rd of the gluteus medius was reflected anteriorly off the greater trochanter. The was released with the anterior portion of the vastus lateralis down to the lesser trochanter. The gluteus medius and minimus was tagged for later repair. The anterior hip capsule was then visualized and split in line with the femoral head. The inferior and superior portion of the capsule was reflected off as  flaps. These were tagged for lateral repair. The capsule was incised all  the way to the acetabular labrum to exposed the femoral head and femoral neck fracture.  An oscillating saw was used to make a femoral neck cut distal to the fracture. The femoral head was removed with a corkscrew and it was measured for sizing of the femoral prothesis. The acetabulum was cleared of the ligamentum teres. The femoral head sizer was used and a 52mm head was chosen.  I then proceeded to focus on broaching the femur. A box osteotomy was used to remove the lateral portion of the femoral neck. A canal finder was used to enter the canal. A lateralizer was used to ream a portion of the lateral femoral neck to prevent varus alignment of the prothesis. Sequential broaching was performed to size the canal and prothesis. I chose a #4 stem as it had good fit with the broach. A calcar planer was used to level the femoral neck cut. The broach was removed and the canal was prepped for cementing.  A canal restrictor was placed in the femoral shaft. The canal was irrigated and cleaned. A lap was placed in the acetabulum to prevent cement from entering the joint. Cement was mixed and once it was ready it was slowly put into the canal. The cement was pressurized and the prosthesis was placed in her natural version. It was held in position while the cement hardened. Once it was hard the femoral stem was trialed off of and I chose a +4 femoral neck. The trial showed that the leg lengths were stable and the hip was stable with inability to dislocated in normal hip positions. The final prosthesis and head was was positioned and the hip was relocated.  The incision was irrigated with normal saline. 1 gram of vancomycin powder was placed in the wound. The capsule was closed with #1 vicryl in interrupted fashion. The hip abductor and vastus lateralis was repaired with #1 vicryl. The IT band was closed with 0 vicryl suture. The skin was closed with 2-0 vicryl, 3-0 monocryl and dermabond. A mepilex dressing was placed.  The patient was placed supine, extubated and taken to the PACU in stable condition.    Post Op Plan/Instructions: The patient will be weightbearing as tolerated. He will have no hip precautions and cannot perform active hip abduction for the first 6 weeks. He will receive her eliquis for VTE prophylaxis and it can start POD1. He will receive postoperative Ancef.  I was present and performed the entire surgery.  Truitt Merle, MD [facility]

## 2017-09-19 NOTE — Progress Notes (Signed)
PROGRESS NOTE    Adam Weeks.  JXB:[id number] DOB: 1927-08-01 DOA: 09/04/2017 PCP: [name], MD   Brief Narrative:   Adam Weeks. is a 83 y.o. male with medical history significant of TIA, HTN, CAD s/p CABG, AVR s/p pig valve, CKD stage 3.  Patient LKW 5pm.  Found down in driveway.  Had expressive and receptive aphasia, severe pain in R hip and to lesser extent R elbow.  CT head showed suspicious area in L cerebrum for acute stroke.  Patient not given TPA due to prior MRI in 2015 which showed remote hemorrhage R cerebellum, as well as acute traumatic injury. R hip is broken as well. Neuro rec'd stroke work up. Ortho also consulted and planning to fix hip, patient on schedule for OR at the moment. Thankfully patient has no neurologic symptoms at the time of my evaluation in ED.   Assessment & Plan:   Principal Problem:   Closed right hip fracture, initial encounter (HCC) Active Problems:   TIA (transient ischemic attack)   Current chronic use of systemic steroids   HTN (hypertension)  #1 displaced right hip fracture Secondary to mechanical fall. Status post right hip hemiarthroplasty per Dr. Jena Gauss. Place on scheduled tylenol. Pain management per orthopedics.   #2 ?? TIA CT head with concern of CVA. MRI/MRA head with no acute abnormalities.Stroke workup underway. Carotid dopplers and 2 d echo pending. FLP with LDL 49.HgbA1C 5.9. Continue statin. No antiplatelet started secondary to patient going to the OR. Neurology following. Risk factor modification. PT/OT/ST. Follow.  #3 hypertension BP meds on hold for permissive hypertension. Follow for now.  #4 chronic steroid use Continue home regimen of prednisone.  #5 hypomagnesemia Replete.  #6 probable chronic kidney disease stage III/IV No significant change. Monitor closely. Follow.   DVT prophylaxis: Per orthopedics. Code Status: Full Family Communication: Patient in PACU. No family at  bedside. Disposition Plan: Likely SNF, when medically stable.   Consultants:   Orthopedics: Dr Jena Gauss 09/14/2017  Neurology: Dr Roseanne Reno 09/12/2017  Procedures:   Chest x-ray 09/25/2017  Plain films of the right humerus 09/03/2017  Plain films of the right elbow 09/26/2017  Plain films of the pelvis 09/02/2017  CT angiogram head and neck 09/24/2017  MRI MRA head 09/14/2017  Carotid ultrasound 08/30/2017 pending  CT head code stroke 09/27/2017  Plain films of the right hip with pelvis 09/07/2017  Antimicrobials:   None   Subjective: In PACU. Complain of right hip pain. Drowsy.  Objective: Vitals:   09/23/2017 1400 09/02/2017 1424 09/04/2017 1449 09/20/2017 1640  BP:  (!) 96/51 (!) 106/51 (!) 126/56  Pulse: (!) 101 100 100 (!) 103  Resp: (!) 23 (!) 42 (!) 36 (!) 32  Temp: 98.3 F (36.8 C) 99.3 F (37.4 C)    TempSrc:  Oral    SpO2: 99% 93% 96% 92%  Weight:        Intake/Output Summary (Last 24 hours) at 08/31/2017 1652 Last data filed at 08/30/2017 1217  Gross per 24 hour  Intake          2778.75 ml  Output              200 ml  Net          2578.75 ml   Filed Weights   09/23/2017 2000 09/23/2017 0457  Weight: 75.4 kg (166 lb 3.6 oz) 77 kg (169 lb 12.1 oz)    Examination:  General exam: In Pacu. Somewhat drowsy. Dry mucous  membranes. Respiratory system: Clear to auscultation bilaterally. No wheezes, no crackles, no rhonchi. Respiratory effort normal. Cardiovascular system: S1 & S2 heard, RRR. No JVD, murmurs, rubs, gallops or clicks. No pedal edema. Gastrointestinal system: Abdomen is nondistended, soft and nontender. No organomegaly or masses felt. Normal bowel sounds heard. Central nervous system: Drowsy. In PACU. No focal neurological deficits. Extremities: Symmetric 5 x 5 power. Skin: No rashes, lesions or ulcers Psychiatry: Unable to assess as patient in PACU and just came out of surgery.     Data Reviewed: I have personally reviewed following labs and  imaging studies  CBC:  Recent Labs Lab 09/13/2017 2002 09/14/2017 2007 10-19-17 1426  WBC 14.6*  --  22.1*  NEUTROABS 12.3*  --   --   HGB 12.1* 12.2* 10.3*  HCT 37.4* 36.0* 32.6*  MCV 101.4*  --  100.9*  PLT 156  --  117*   Basic Metabolic Panel:  Recent Labs Lab 08/30/2017 2002 09/10/2017 2007 October 19, 2017 1416  NA 140 141 141  K 5.0 4.8 4.6  CL 106 108 111  CO2 18*  --  20*  GLUCOSE 133* 127* 113*  BUN 42* 42* 38*  CREATININE 1.67* 1.60* 1.80*  CALCIUM 8.8*  --  7.8*  MG  --   --  1.4*   GFR: Estimated Creatinine Clearance: 29.8 mL/min (A) (by C-G formula based on SCr of 1.8 mg/dL (H)). Liver Function Tests:  Recent Labs Lab 09/17/2017 2002  AST 25  ALT 19  ALKPHOS 59  BILITOT 0.8  PROT 6.5  ALBUMIN 3.5   No results for input(s): LIPASE, AMYLASE in the last 168 hours. No results for input(s): AMMONIA in the last 168 hours. Coagulation Profile:  Recent Labs Lab 09/13/2017 2002  INR 1.10   Cardiac Enzymes:  Recent Labs Lab 09/26/2017 2002  CKTOTAL 45*   BNP (last 3 results)  Recent Labs  04/13/17 1552  PROBNP 399.0*   HbA1C:  Recent Labs  October 19, 2017 0431  HGBA1C 5.9*   CBG:  Recent Labs Lab 09/27/2017 2000  GLUCAP 135*   Lipid Profile:  Recent Labs  October 19, 2017 0431  CHOL 133  HDL 56  LDLCALC 49  TRIG 138  CHOLHDL 2.4   Thyroid Function Tests: No results for input(s): TSH, T4TOTAL, FREET4, T3FREE, THYROIDAB in the last 72 hours. Anemia Panel: No results for input(s): VITAMINB12, FOLATE, FERRITIN, TIBC, IRON, RETICCTPCT in the last 72 hours. Sepsis Labs: No results for input(s): PROCALCITON, LATICACIDVEN in the last 168 hours.  No results found for this or any previous visit (from the past 240 hour(s)).       Radiology Studies: Ct Angio Head W Or Wo Contrast  Result Date: 09/21/2017 CLINICAL DATA:  Initial evaluation for acute aphasia. EXAM: CT ANGIOGRAPHY HEAD AND NECK TECHNIQUE: Multidetector CT imaging of the head and neck was  performed using the standard protocol during bolus administration of intravenous contrast. Multiplanar CT image reconstructions and MIPs were obtained to evaluate the vascular anatomy. Carotid stenosis measurements (when applicable) are obtained utilizing NASCET criteria, using the distal internal carotid diameter as the denominator. CONTRAST:  50 cc of Isovue 370. COMPARISON:  Prior CT from earlier same day. FINDINGS: CTA NECK FINDINGS Aortic arch: Visualized aortic arch of normal caliber with with normal branch pattern. Scattered atheromatous plaque present about the arch itself as well as the origin of the great vessels without flow-limiting stenosis. Visualized subclavian artery is widely patent. Right carotid system: Right common carotid artery patent from its origin to  the bifurcation. Calcified noncalcified plaque about the right bifurcation/proximal right ICA. Associated stenosis of up to 70% by NASCET criteria. Right ICA patent distally to the skullbase without stenosis. Left carotid system: Left common carotid artery tortuous proximally but widely patent to the bifurcation. Calcified plaque about the left bifurcation without flow-limiting stenosis. Left ICA patent distally to the skullbase without stenosis or occlusion. Vertebral arteries: Both vertebral arteries arise from the subclavian arteries. Vertebral artery's tortuous with scattered atheromatous irregularity without high-grade flow-limiting stenosis. Vertebral arteries are patent to the skullbase. Skeleton: No acute osseus abnormality. No worrisome lytic or blastic osseous lesions. Advanced degenerative spondylolysis throughout the cervical spine. Grade 1 anterolisthesis of C2 on C3 and C7 on T1. Other neck: No acute soft tissue abnormality within the neck. Salivary glands demonstrate no acute abnormality. Thyroid within normal limits. No adenopathy. Upper chest: Circumferential wall thickening about the visualized upper esophagus. Visualized upper  mediastinum otherwise within normal limits. Partially visualized lungs are clear. Review of the MIP images confirms the above findings CTA HEAD FINDINGS Anterior circulation: Petrous segments widely patent bilaterally. Smooth atheromatous plaque throughout the cavernous/ supraclinoid ICAs with moderate diffuse narrowing. Changes most notable at the supraclinoid right ICA. ICA termini patent bilaterally. A1 segments irregular but patent without stenosis. Patent anterior communicating artery. Multifocal atheromatous irregularity within the A2 segments bilaterally, left greater than right, without high-grade stenosis. ACAs are patent to their distal aspects. Right M1 segment somewhat irregular but patent to its distal aspect without flow-limiting stenosis. Right MCA bifurcation normal. Proximal M2 stenoses and irregularity without occlusion. MCA branches demonstrate extensive atheromatous irregularity but are perfused to their distal aspects. Left M1 segment patent without stenosis or occlusion. Proximal and 2 stenoses and irregularity without occlusion. Distal left MCA branches are extensively irregular but patent to their distal aspects. Posterior circulation: Multifocal atheromatous plaque within the bilateral V4 segments with mild to moderate multifocal narrowing. Left vertebral artery dominant. Posterior inferior cerebral arteries are patent proximally. Basilar artery irregular but patent to its distal aspect without high-grade stenosis. Superior cerebral arteries patent bilaterally. Right PCA supplied via the basilar and is widely patent to its distal aspect with moderate atheromatous irregularity. Fetal type origin of the left PCA supplied via a patent left posterior communicating artery. Left PCA also patent to its distal aspect without flow-limiting stenosis with moderate multifocal atheromatous irregularity. Venous sinuses: Not well evaluated due to arterial timing of the contrast bolus. Anatomic variants:  Fetal type origin of the left PCA. No other significant. Delayed phase: Not performed. Review of the MIP images confirms the above findings IMPRESSION: 1. Negative CTA for emergent large vessel occlusion. 2. Atheromatous plaque at the right carotid bifurcation with associated stenosis of up to 70% by NASCET criteria. 3. Additional extensive atheromatous disease involving the medium and small vessels of the head and neck as above. No other high-grade or correctable stenosis identified. 4. Diffuse esophageal wall thickening about the visualized upper esophagus. Considerations include sequelae of reflux disease or possibly acute esophagitis. Critical Value/emergent results were called by telephone at the time of interpretation on 09/23/2017 at 8:31 pm to Dr. Noel Christmas , who verbally acknowledged these results. Electronically Signed   By: Rise Mu M.D.   On: 09/17/2017 21:07   Dg Chest 1 View  Result Date: 09/01/2017 CLINICAL DATA:  Fall with pain. EXAM: CHEST 1 VIEW COMPARISON:  04/13/2017 FINDINGS: Lungs are adequately inflated with mild elevation of the left hemidiaphragm unchanged. No focal lobar consolidation or effusion. No pneumothorax. Mild  stable cardiomegaly. Calcified plaque over the aortic arch. Remainder of the exam is unchanged. IMPRESSION: No acute cardiopulmonary disease. Stable cardiomegaly. Aortic Atherosclerosis (ICD10-I70.0). Electronically Signed   By: Elberta Fortis M.D.   On: 09/13/2017 21:47   Dg Elbow Complete Right (3+view)  Result Date: 09/26/2017 CLINICAL DATA:  Fall with right elbow pain. EXAM: RIGHT ELBOW - COMPLETE 3+ VIEW COMPARISON:  None. FINDINGS: Lateral film is not optimal. No evidence of acute fracture or dislocation. No significant joint effusion. IMPRESSION: No acute findings. Electronically Signed   By: Elberta Fortis M.D.   On: 09/24/2017 21:49   Ct Angio Neck W Or Wo Contrast  Result Date: 08/29/2017 CLINICAL DATA:  Initial evaluation for acute  aphasia. EXAM: CT ANGIOGRAPHY HEAD AND NECK TECHNIQUE: Multidetector CT imaging of the head and neck was performed using the standard protocol during bolus administration of intravenous contrast. Multiplanar CT image reconstructions and MIPs were obtained to evaluate the vascular anatomy. Carotid stenosis measurements (when applicable) are obtained utilizing NASCET criteria, using the distal internal carotid diameter as the denominator. CONTRAST:  50 cc of Isovue 370. COMPARISON:  Prior CT from earlier same day. FINDINGS: CTA NECK FINDINGS Aortic arch: Visualized aortic arch of normal caliber with with normal branch pattern. Scattered atheromatous plaque present about the arch itself as well as the origin of the great vessels without flow-limiting stenosis. Visualized subclavian artery is widely patent. Right carotid system: Right common carotid artery patent from its origin to the bifurcation. Calcified noncalcified plaque about the right bifurcation/proximal right ICA. Associated stenosis of up to 70% by NASCET criteria. Right ICA patent distally to the skullbase without stenosis. Left carotid system: Left common carotid artery tortuous proximally but widely patent to the bifurcation. Calcified plaque about the left bifurcation without flow-limiting stenosis. Left ICA patent distally to the skullbase without stenosis or occlusion. Vertebral arteries: Both vertebral arteries arise from the subclavian arteries. Vertebral artery's tortuous with scattered atheromatous irregularity without high-grade flow-limiting stenosis. Vertebral arteries are patent to the skullbase. Skeleton: No acute osseus abnormality. No worrisome lytic or blastic osseous lesions. Advanced degenerative spondylolysis throughout the cervical spine. Grade 1 anterolisthesis of C2 on C3 and C7 on T1. Other neck: No acute soft tissue abnormality within the neck. Salivary glands demonstrate no acute abnormality. Thyroid within normal limits. No  adenopathy. Upper chest: Circumferential wall thickening about the visualized upper esophagus. Visualized upper mediastinum otherwise within normal limits. Partially visualized lungs are clear. Review of the MIP images confirms the above findings CTA HEAD FINDINGS Anterior circulation: Petrous segments widely patent bilaterally. Smooth atheromatous plaque throughout the cavernous/ supraclinoid ICAs with moderate diffuse narrowing. Changes most notable at the supraclinoid right ICA. ICA termini patent bilaterally. A1 segments irregular but patent without stenosis. Patent anterior communicating artery. Multifocal atheromatous irregularity within the A2 segments bilaterally, left greater than right, without high-grade stenosis. ACAs are patent to their distal aspects. Right M1 segment somewhat irregular but patent to its distal aspect without flow-limiting stenosis. Right MCA bifurcation normal. Proximal M2 stenoses and irregularity without occlusion. MCA branches demonstrate extensive atheromatous irregularity but are perfused to their distal aspects. Left M1 segment patent without stenosis or occlusion. Proximal and 2 stenoses and irregularity without occlusion. Distal left MCA branches are extensively irregular but patent to their distal aspects. Posterior circulation: Multifocal atheromatous plaque within the bilateral V4 segments with mild to moderate multifocal narrowing. Left vertebral artery dominant. Posterior inferior cerebral arteries are patent proximally. Basilar artery irregular but patent to its distal aspect without  high-grade stenosis. Superior cerebral arteries patent bilaterally. Right PCA supplied via the basilar and is widely patent to its distal aspect with moderate atheromatous irregularity. Fetal type origin of the left PCA supplied via a patent left posterior communicating artery. Left PCA also patent to its distal aspect without flow-limiting stenosis with moderate multifocal atheromatous  irregularity. Venous sinuses: Not well evaluated due to arterial timing of the contrast bolus. Anatomic variants: Fetal type origin of the left PCA. No other significant. Delayed phase: Not performed. Review of the MIP images confirms the above findings IMPRESSION: 1. Negative CTA for emergent large vessel occlusion. 2. Atheromatous plaque at the right carotid bifurcation with associated stenosis of up to 70% by NASCET criteria. 3. Additional extensive atheromatous disease involving the medium and small vessels of the head and neck as above. No other high-grade or correctable stenosis identified. 4. Diffuse esophageal wall thickening about the visualized upper esophagus. Considerations include sequelae of reflux disease or possibly acute esophagitis. Critical Value/emergent results were called by telephone at the time of interpretation on 09/17/2017 at 8:31 pm to Dr. Noel Christmas , who verbally acknowledged these results. Electronically Signed   By: Rise Mu M.D.   On: 08/30/2017 21:07   Dg Pelvis Portable  Result Date: 14-Oct-2017 CLINICAL DATA:  Femoral neck fracture. EXAM: PORTABLE PELVIS 1-2 VIEWS COMPARISON:  09/17/2017 FINDINGS: Patient now has a right hip arthroplasty. The arthroplasty appears located on this single view. Pelvic bony ring is intact. Multiple phleboliths in the pelvis. Again noted is mild joint space narrowing in the left hip joint. Lucency in the right upper leg soft tissues compatible with recent surgery. IMPRESSION: Right hip arthroplasty without complicating features. Electronically Signed   By: Richarda Overlie M.D.   On: Oct 14, 2017 13:40   Dg Humerus Right  Result Date: 09/03/2017 CLINICAL DATA:  Fall with pain. EXAM: RIGHT HUMERUS - 2+ VIEW COMPARISON:  None. FINDINGS: Diffuse osteopenia. Mild degenerate change of the glenohumeral joint and AC joint. No acute fracture or dislocation. IMPRESSION: No acute findings. Electronically Signed   By: Elberta Fortis M.D.   On:  09/03/2017 21:48   Mr Brain Limited Wo Contrast  Result Date: 10/14/17 CLINICAL DATA:  Found in driveway. Aphasic. Assess for cerebellar stroke. EXAM: MRI HEAD WITHOUT CONTRAST MRA HEAD WITHOUT CONTRAST TECHNIQUE: Sagittal, axial and coronal diffusion weighted imaging of the brain ; per technologist note, patient refused further imaging. Angiographic images of the head were obtained using MRA technique without contrast. COMPARISON:  CT/CTA HEAD September 18, 2017 and MRI/MRA of the head June 04, 2014 FINDINGS: MRI HEAD FINDINGS BRAIN: No reduced diffusion to suggest acute ischemia, hypercellular tumor or typical infection. Chronic microhemorrhage RIGHT cerebellum present on prior MRI. Ventricles and sulci are normal for patient's age. No midline shift, mass effect or definite masses though limited assessment. No large extra-axial fluid collections. VASCULAR: Not assessed. SKULL AND UPPER CERVICAL SPINE: No abnormal sellar expansion. SINUSES/ORBITS: Not assessed. OTHER: None. MRA HEAD FINDINGS- moderately motion degraded examination. ANTERIOR CIRCULATION: Normal flow related enhancement of the included cervical, petrous, cavernous and supraclinoid internal carotid arteries. Patent anterior communicating artery. Patent anterior and middle cerebral arteries, including distal segments. POSTERIOR CIRCULATION: Codominant vertebral artery's. Basilar artery is patent, with normal flow related enhancement of the main branch vessels. Patent posterior cerebral arteries. Fetal origin of LEFT posterior cerebral artery. ANATOMIC VARIANTS: None.Source images and MIP images were reviewed. IMPRESSION: MRI HEAD: 1. Limited 3 sequence MRI of the head: No acute intracranial process. MRA HEAD: 1.  No emergent large vessel occlusion or flow limiting stenosis on this moderately motion degraded examination. Electronically Signed   By: Awilda Metro M.D.   On: 09/25/2017 01:50   Mr Maxine Glenn Head Wo Contrast  Result Date:  08/30/2017 CLINICAL DATA:  Found in driveway. Aphasic. Assess for cerebellar stroke. EXAM: MRI HEAD WITHOUT CONTRAST MRA HEAD WITHOUT CONTRAST TECHNIQUE: Sagittal, axial and coronal diffusion weighted imaging of the brain ; per technologist note, patient refused further imaging. Angiographic images of the head were obtained using MRA technique without contrast. COMPARISON:  CT/CTA HEAD 10/03/17 and MRI/MRA of the head June 04, 2014 FINDINGS: MRI HEAD FINDINGS BRAIN: No reduced diffusion to suggest acute ischemia, hypercellular tumor or typical infection. Chronic microhemorrhage RIGHT cerebellum present on prior MRI. Ventricles and sulci are normal for patient's age. No midline shift, mass effect or definite masses though limited assessment. No large extra-axial fluid collections. VASCULAR: Not assessed. SKULL AND UPPER CERVICAL SPINE: No abnormal sellar expansion. SINUSES/ORBITS: Not assessed. OTHER: None. MRA HEAD FINDINGS- moderately motion degraded examination. ANTERIOR CIRCULATION: Normal flow related enhancement of the included cervical, petrous, cavernous and supraclinoid internal carotid arteries. Patent anterior communicating artery. Patent anterior and middle cerebral arteries, including distal segments. POSTERIOR CIRCULATION: Codominant vertebral artery's. Basilar artery is patent, with normal flow related enhancement of the main branch vessels. Patent posterior cerebral arteries. Fetal origin of LEFT posterior cerebral artery. ANATOMIC VARIANTS: None.Source images and MIP images were reviewed. IMPRESSION: MRI HEAD: 1. Limited 3 sequence MRI of the head: No acute intracranial process. MRA HEAD: 1. No emergent large vessel occlusion or flow limiting stenosis on this moderately motion degraded examination. Electronically Signed   By: Awilda Metro M.D.   On: 08/30/2017 01:50   Dg Hip Unilat With Pelvis 2-3 Views Right  Result Date: October 03, 2017 CLINICAL DATA:  Fall with pain. EXAM: DG HIP  (WITH OR WITHOUT PELVIS) 2-3V RIGHT COMPARISON:  None. FINDINGS: There is diffuse osteopenia. There mild degenerate changes of the hips. There is a displaced fracture of the right femoral neck likely subcapital in location. Superior displacement of the distal fragment. Hardware over the lumbosacral spine. Several pelvic phleboliths. IMPRESSION: Displaced right femoral neck fracture. Electronically Signed   By: Elberta Fortis M.D.   On: 10/03/17 21:46   Ct Head Code Stroke Wo Contrast  Result Date: 10-03-2017 CLINICAL DATA:  Code stroke. Initial evaluation for acute aphasia. Weakness. EXAM: CT HEAD WITHOUT CONTRAST TECHNIQUE: Contiguous axial images were obtained from the base of the skull through the vertex without intravenous contrast. COMPARISON:  Prior MRI from 06/04/2014. FINDINGS: Brain: Generalized age related cerebral atrophy with chronic small vessel ischemic disease. Patchy 13 mm hypodensity within the inferior left cerebellar hemisphere, suspicious for evolving acute ischemic infarct (series 3, image 5). No other definite acute large vessel territory infarct. No acute intracranial hemorrhage. No mass lesion or midline shift. No hydrocephalus. No extra-axial fluid collection. Vascular: Question asymmetric hyperdensity involving the right M1 segment. Prominent vascular calcifications within the carotid siphons. Skull: Scalp soft tissues and calvarium within normal limits. Sinuses/Orbits: Globes and orbital soft tissues normal. Patient status post lens extraction bilaterally. Chronic right sphenoid sinus disease. Paranasal sinuses otherwise clear. No mastoid effusion. Other: None. ASPECTS [facility] Stroke Program Early CT Score) - Ganglionic level infarction (caudate, lentiform nuclei, internal capsule, insula, M1-M3 cortex): 7 - Supraganglionic infarction (M4-M6 cortex): 3 Total score (0-10 with 10 being normal): 10 IMPRESSION: 1. Question asymmetric hyperdensity within the right M1 segment/dense M1,  which may reflect intravascular  thrombus. Further evaluation with CTA recommended. No definite evidence for evolving left MCA territory ischemia. No acute intracranial hemorrhage. 2. ASPECTS is 10 3. Patchy hypodensity within the inferior left cerebellar hemisphere, suspicious for evolving acute left PICA territory infarct. 4. Atrophy with chronic small vessel ischemic disease. Critical Value/emergent results were called by telephone at the time of interpretation on 09/11/2017 at 8:31 pm to Dr. Roseanne Reno , who verbally acknowledged these results. Electronically Signed   By: Rise Mu M.D.   On: 09/04/2017 20:31        Scheduled Meds: . allopurinol  100 mg Oral Daily  . aspirin  81 mg Oral BID  . atorvastatin  40 mg Oral Daily  . cholecalciferol  1,000 Units Oral Daily  . docusate sodium  100 mg Oral BID  . finasteride  5 mg Oral Daily  . multivitamin  2 tablet Oral BID  . pantoprazole  40 mg Oral BID AC  . predniSONE  10 mg Oral Q breakfast  . venlafaxine XR  225 mg Oral Q breakfast   Continuous Infusions: . sodium chloride Stopped (September 24, 2017 1217)  .  ceFAZolin (ANCEF) IV Stopped (09/24/17 1642)     LOS: 1 day    Time spent: 35 minutes    Aalina Brege, MD [facility] Pager [phone]  If 7PM-7AM, please contact night-coverage [email or web address] Password [facility] September 24, 2017, 4:52 PM

## 2017-09-19 NOTE — Progress Notes (Signed)
STROKE TEAM PROGRESS NOTE   HISTORY OF PRESENT ILLNESS (per record) Adam Weeks. is an 83 y.o. male with a history of stroke, hypertension, hyperlipidemia, coronary artery disease, chronic kidney disease and osteoporosis brought to the ED in code stroke status after being found down on his driveway. He was last seen well at 5:00 PM. Patient was noted to have receptive and expressive aphasia. He also sustained trauma to his right extremities and was complaining of severe pain, particularly involving his right hip, and to a lesser extent his right upper elbow and shoulder. CT scan of his head showed an area of low density involving left cerebellum suspicious for possible acute stroke. No acute lesion involving his left cerebrum was noted. CT angiogram showed no large vessel occlusion or severe stenosis. Patient had an MRI study in June 2015 which showed an area of remote hemorrhage involving the right cerebellum. Patient was not given TPA because of this finding on MRI as well as acute trauma and a displaced right femoral neck fracture  LSN: 5:00 PM on 2017-09-27 tPA Given: No: As noted above in present illness mRankin:   SUBJECTIVE (INTERVAL HISTORY) His wife and son and daughter are at the bedside.  Pt just back from right hip surgery. BP initially low on arrival but later improved. Pt currently awake alert and neuro intact but has significant right hip pain post surgery    OBJECTIVE Temp:  [97.5 F (36.4 C)-99 F (37.2 C)] 99 F (37.2 C) (09/22 0457) Pulse Rate:  [98-113] 113 (09/22 0457) Cardiac Rhythm: Sinus tachycardia (09/22 0700) Resp:  [16-24] 20 (09/22 0457) BP: (151-183)/(79-104) 151/79 (09/22 0457) SpO2:  [80 %-99 %] 95 % (09/22 0457) Weight:  [166 lb 3.6 oz (75.4 kg)-169 lb 12.1 oz (77 kg)] 169 lb 12.1 oz (77 kg) (09/22 0457)  CBC:   Recent Labs Lab 09-27-17 2002 09-27-2017 2007  WBC 14.6*  --   NEUTROABS 12.3*  --   HGB 12.1* 12.2*  HCT 37.4* 36.0*  MCV 101.4*  --    PLT 156  --     Basic Metabolic Panel:   Recent Labs Lab 2017/09/27 2002 September 27, 2017 2007  NA 140 141  K 5.0 4.8  CL 106 108  CO2 18*  --   GLUCOSE 133* 127*  BUN 42* 42*  CREATININE 1.67* 1.60*  CALCIUM 8.8*  --     Lipid Panel:     Component Value Date/Time   CHOL 133 09/10/2017 0431   TRIG 138 09/18/2017 0431   HDL 56 08/30/2017 0431   CHOLHDL 2.4 08/30/2017 0431   VLDL 28 09/01/2017 0431   LDLCALC 49 09/03/2017 0431   HgbA1c:  Lab Results  Component Value Date   HGBA1C 5.9 (H) 09/18/2017   Urine Drug Screen: No results found for: LABOPIA, COCAINSCRNUR, LABBENZ, AMPHETMU, THCU, LABBARB  Alcohol Level No results found for: [facility]  IMAGING I have personally reviewed the radiological images below and agree with the radiology interpretations.  Ct Head Code Stroke Wo Contrast 2017-09-27 1. Question asymmetric hyperdensity within the right M1 segment/dense M1, which may reflect intravascular thrombus. Further evaluation with CTA recommended. No definite evidence for evolving left MCA territory ischemia. No acute intracranial hemorrhage.  2. ASPECTS is 10  3. Patchy hypodensity within the inferior left cerebellar hemisphere, suspicious for evolving acute left PICA territory infarct. 4. Atrophy with chronic small vessel ischemic disease.   Ct Angio Head W Or Wo Contrast Ct Angio Neck W Or Wo Contrast 09/27/17  IMPRESSION:  1. Negative CTA for emergent large vessel occlusion.  2. Atheromatous plaque at the right carotid bifurcation with associated stenosis of up to 70% by NASCET criteria.  3. Additional extensive atheromatous disease involving the medium and small vessels of the head and neck as above. No other high-grade or correctable stenosis identified.  4. Diffuse esophageal wall thickening about the visualized upper esophagus. Considerations include sequelae of reflux disease or possibly acute esophagitis.   Mr Adam Weeks Head Wo Contrast 09/24/2017 IMPRESSION:  MRI HEAD:   1. Limited 3 sequence MRI of the head: No acute intracranial process.  MRA HEAD:  1. No emergent large vessel occlusion or flow limiting stenosis on this moderately motion degraded examination.   Dg Hip Unilat With Pelvis 2-3 Views Right 10-10-2017 IMPRESSION:  Displaced right femoral neck fracture.   CUS pending  TTE pending    PHYSICAL EXAM  Vitals:   09/25/2017 0125 09/23/2017 0200 09/04/2017 0308 09/07/2017 0457  BP: (!) 177/95 (!) 179/104 (!) 179/103 (!) 151/79  Pulse: (!) 104 (!) 110 (!) 109 (!) 113  Resp:  20 (!) 22 20  Temp:   (!) 97.5 F (36.4 C) 99 F (37.2 C)  TempSrc:   Oral Oral  SpO2: 96% 94% 96% 95%  Weight:    169 lb 12.1 oz (77 kg)    Temp:  [97.5 F (36.4 C)-99.3 F (37.4 C)] 98.6 F (37 C) (09/22 2015) Pulse Rate:  [93-114] 101 (09/22 2100) Resp:  [16-47] 20 (09/22 2015) BP: (90-183)/(50-104) 123/50 (09/22 2100) SpO2:  [87 %-99 %] 93 % (09/22 2015) Weight:  [169 lb 12.1 oz (77 kg)] 169 lb 12.1 oz (77 kg) (09/22 0457)  General - Well nourished, well developed, in mild distress due to hip pain  Ophthalmologic - Fundi not visualized due to noncooperation.  Cardiovascular - Regular rate and rhythm.  Mental Status -  Level of arousal and orientation to time, place, and person were intact. Language including expression, naming, repetition, comprehension was assessed and found intact. Fund of Knowledge was assessed and was intact.  Cranial Nerves II - XII - II - Visual field intact OU. III, IV, VI - Extraocular movements intact. V - Facial sensation intact bilaterally. VII - Facial movement intact bilaterally. VIII - Hearing & vestibular intact bilaterally. X - Palate elevates symmetrically. XI - Chin turning & shoulder shrug intact bilaterally. XII - Tongue protrusion intact.  Motor Strength - The patient's strength was 4/5 in BUE and LLE  and pronator drift was absent. RLE proximal 0/5 due to pain and right hip surgery, however, distally 4/5 with toe  PF and DF. Bulk was normal and fasciculations were absent.   Motor Tone - Muscle tone was assessed at the neck and appendages and was normal.  Reflexes - The patient's reflexes were 1+ in all extremities and he had no pathological reflexes.  Sensory - Light touch, temperature/pinprick were assessed and were symmetrical.    Coordination - The patient had normal movements in the hands with no ataxia or dysmetria.  Tremor was absent.  Gait and Station - not tested due to right hip surgery    ASSESSMENT/PLAN Mr. Sione Baumgarten. is a 83 y.o. male with history of a previous stroke, hypertension, hyperlipidemia, gout, chronic kidney disease, and coronary artery disease  brought to [facility] after the patient was found down on his driveway. He was noted to have receptive and expressive aphasia with trauma to his extremities including a displaced right femoral  neck fracture. He did not receive IV t-PA due to an MRI revealing a remote hemorrhage involving the right cerebellum.  Transient speech difficulty after fall - syncope vs. Near syncope vs. Dehydration. TIA less likely. As per family, pt was washing car under the sun, did not eat or drink, hx of orthostatic hypotension  Resultant right hip fracture s/p surgery  CT head - Question asymmetric hyperdensity within the right M1 segment/dense M1, question left cerebellar hypodensity.  MRI head - Limited 3 sequence MRI of the head: No acute intracranial process. Chronic right cerebellar small hemorrhage  MRA head - motion degraded but otherwise unremarkable.  CTA H&N - right internal carotid artery plaque with 70% stenosis.  Carotid Doppler - pending  2D Echo - pending  LDL - 49  HgbA1c - 5.9  VTE prophylaxis - SCDs Diet NPO time specified Except for: Sips with Meds  aspirin 81 mg daily prior to admission, now on aspirin 81 mg daily. Continue on discharge.  Patient counseled to be compliant with his antithrombotic  medications  Ongoing aggressive stroke risk factor management  Therapy recommendations: - pending  Disposition: Pending  Hx of orthostasis   Followed by PCP  Likely due to low po intake and low volume intake  May explain the current fall  Encourage po intake and check BP at home  May consider decrease coreg dose if BP constantly low at home  Right hip fracture  Due to fall  S/p hip surgery by ortho  PT/OT  Pain management  Carotid stenosis  CTA showed right ICA 70%  CUS pending  Asymptomatic this time  No need intervention  Follow up with [facility] as outpt  Hypertension  Stable   Home meds - coreg  Close home monitoring  Long-term BP goal normotensive  Hyperlipidemia  Home meds:  Lipitor 40 mg daily resumed in hospital  LDL 49, goal < 70  Continue statin at discharge  Other Stroke Risk Factors  Advanced age  Former smoker - quit  Hx stroke/TIA - Remote right cerebellar hemorrhage on MRI  Other Active Problems  CKD stage III - BUN 42 creatinine 1.60  Leukocytosis - 14.6->22.1 - afebrile  Hospital day # 1  Neurology will sign off. Please call with questions. No neuro follow up needed at this time. Thanks for the consult.   Marvel Plan, MD PhD Stroke Neurology 10-02-2017 9:18 PM   To contact Stroke Continuity provider, please refer to [email or web address]. After hours, contact General Neurology

## 2017-09-19 NOTE — Anesthesia Procedure Notes (Signed)
Procedure Name: Intubation Date/Time: 10/12/17 9:49 AM Performed by: Rosiland Oz Pre-anesthesia Checklist: Patient identified, Emergency Drugs available, Suction available, Patient being monitored and Timeout performed Patient Re-evaluated:Patient Re-evaluated prior to induction Oxygen Delivery Method: Circle system utilized Preoxygenation: Pre-oxygenation with 100% oxygen Induction Type: IV induction Ventilation: Mask ventilation without difficulty Laryngoscope Size: Miller and 3 Grade View: Grade II Tube type: Oral Tube size: 7.5 mm Number of attempts: 1 Airway Equipment and Method: Stylet Placement Confirmation: ETT inserted through vocal cords under direct vision,  positive ETCO2 and breath sounds checked- equal and bilateral Secured at: 22 cm Tube secured with: Tape Dental Injury: Teeth and Oropharynx as per pre-operative assessment

## 2017-09-19 NOTE — ED Notes (Signed)
Patient transported to MRI 

## 2017-09-19 NOTE — Evaluation (Signed)
Clinical/Bedside Swallow Evaluation Patient Details  Name: Adam Weeks. MRN: [id number] Date of Birth: 04-22-1927  Today's Date: 09/16/2017 Time: SLP Start Time (ACUTE ONLY): 1740 SLP Stop Time (ACUTE ONLY): 1758 SLP Time Calculation (min) (ACUTE ONLY): 18 min  Past Medical History:  Past Medical History:  Diagnosis Date  . CAD (coronary artery disease)   . Chronic kidney disease    stones  . GERD (gastroesophageal reflux disease)   . History of gout 09/28/2014   2014  . Hyperlipidemia   . Hypertension   . Osteoporosis   . Stroke [facility])    TIA   Past Surgical History:  Past Surgical History:  Procedure Laterality Date  . CARDIAC VALVE REPLACEMENT  2009   pig valve -- AVR  . CORONARY ARTERY BYPASS GRAFT     2009  . HERNIA REPAIR     HPI:  Adam Weeks.is an 83 y.o.maleadmitted with history of stroke, GERD, hypertension, hyperlipidemia, coronary artery disease, chronic kidney disease and osteoporosis brought to the ED after being found down on his driveway. Patient was noted to have receptive and expressive aphasia, right displaced femoral neck fracture. He also sustained trauma to his right extremities, now s/p right hip hemiarthroplasty. He was evaluated by the neurology team and was normal upon exam. MRI of his brain was performed which showed no signs of stroke. CTA negative for large vessel occlusion, however revealed diffuse esophageal wall thickening about the visualized upper esophagus. Considerations include sequelae of reflux disease or possibly acute esophagitis. Referred for swallowing evaluation.   Assessment / Plan / Recommendation Clinical Impression  Patient presents with mild oral dysphagia secondary to jaw pain limiting his ability to thoroughly masticate solids. He is alert, cooperative, responding appropriately to questions, following all basic commands. No focal cranial nerve deficits noted; pt states prior history of TIA vs CVA. No overt signs of  aspiration noted with any consistency trialed, including consecutive sips of 3oz thin liquids via straw. Recommend dys 2 diet with thin liquids for ease of mastication, medications whole with liquid. SLP will f/u for tolerance, advancement of solids as tolerated.  SLP Visit Diagnosis: Dysphagia, oral phase (R13.11)    Aspiration Risk  Mild aspiration risk    Diet Recommendation Dysphagia 2 (Fine chop);Thin liquid   Liquid Administration via: Cup;Straw Medication Administration: Whole meds with liquid Supervision: Patient able to self feed Compensations: Slow rate;Small sips/bites;Follow solids with liquid Postural Changes: Seated upright at 90 degrees;Remain upright for at least 30 minutes after po intake    Other  Recommendations Oral Care Recommendations: Oral care BID   Follow up Recommendations None      Frequency and Duration min 1 x/week  1 week       Prognosis Prognosis for Safe Diet Advancement: Good      Swallow Study   General Date of Onset: 2017-10-04 HPI: Adam Weeks.is an 83 y.o.maleadmitted with history of stroke, GERD, hypertension, hyperlipidemia, coronary artery disease, chronic kidney disease and osteoporosis brought to the ED after being found down on his driveway. Patient was noted to have receptive and expressive aphasia, right displaced femoral neck fracture. He also sustained trauma to his right extremities, now s/p right hip hemiarthroplasty. He was evaluated by the neurology team and was normal upon exam. MRI of his brain was performed which showed no signs of stroke. CTA negative for large vessel occlusion, however revealed diffuse esophageal wall thickening about the visualized upper esophagus. Considerations include sequelae of reflux disease  or possibly acute esophagitis. Referred for swallowing evaluation. Type of Study: Bedside Swallow Evaluation Previous Swallow Assessment: none in chart Diet Prior to this Study: NPO Temperature Spikes Noted:  No Respiratory Status: Nasal cannula History of Recent Intubation: Yes Length of Intubations (days):  (for surgery only) Date extubated: 09/02/2017 Behavior/Cognition: Alert;Cooperative;Pleasant mood Oral Cavity Assessment: Within Functional Limits Oral Care Completed by SLP: No Oral Cavity - Dentition: Adequate natural dentition Vision: Functional for self-feeding Self-Feeding Abilities: Able to feed self Patient Positioning: Upright in bed Baseline Vocal Quality: Hoarse Volitional Cough: Strong Volitional Swallow: Able to elicit    Oral/Motor/Sensory Function Overall Oral Motor/Sensory Function: Within functional limits   Ice Chips Ice chips: Within functional limits Presentation: Spoon   Thin Liquid Thin Liquid: Within functional limits Presentation: Straw;Cup;Self Fed    Nectar Thick Nectar Thick Liquid: Not tested   Honey Thick Honey Thick Liquid: Not tested   Puree Puree: Within functional limits   Solid   GO   Solid: Impaired Presentation: Self Fed Oral Phase Impairments: Impaired mastication Oral Phase Functional Implications: Impaired mastication;Oral residue;Prolonged oral transit       Adam Baton, MS, CCC-SLP Speech-Language Pathologist [phone]  Arlana Lindau 09/12/2017,6:09 PM

## 2017-09-19 NOTE — Progress Notes (Signed)
OT Cancellation    09/14/2017 0900  OT Visit Information  Last OT Received On 09/04/2017  Reason Eval/Treat Not Completed Patient at procedure or test/ unavailable (Will return as schedule allows.)   Curlene Dolphin MSOT, OTR/L Acute Rehab Pager: [phone] Office: [phone]

## 2017-09-19 NOTE — Progress Notes (Signed)
..   Name: Adam Weeks. MRN: [id number] DOB: Aug 25, 1927    ADMISSION DATE:  09/20/2017 CONSULTATION DATE: 10/12/2017  REFERRING MD :  Janee Morn MD CHIEF COMPLAINT:    BRIEF PATIENT DESCRIPTION: 83 YR OLD MALE PRESENTED ON 09/22/2017 S/P FALL, a displaced right femoral neck fracture, POD 0 Right hip hemiarthroplasty. Noted to Afib RVR received Metoprolol 2.5mg  bradycardic S/P CARDIOPULMONARY ARREST  SIGNIFICANT EVENTS  S/p PEA arrest  POD O Right hemarthroplasty  STUDIES:  CTA Head and neck- 09/23/2017 CXR XR ELBOW/HUMERUS/LUMBAR SPINE/PELVIS MRI BRAIN 2017-10-12   HISTORY OF PRESENT ILLNESS:  83 y.o.malewith medical history significant of TIA, HTN, CAD s/p CABG, AVR s/p pig valve, CKD stage 3. Presented to [facility] after being found down in driveway. Had expressive and receptive aphasia, severe pain in R hip and to lesser extent R elbow. CT head showed suspicious area in L cerebrum for acute stroke. Patient not given TPA due to prior MRI in 2015 which showed remote hemorrhage R cerebellum, as well as acute traumatic injury. CTA negative Right carotid bifurcation w/ assoc stenosis of up to 70%. MRa was negative. Also found to have a Right femoral neck fracture. POD 0 Right hemiarthroplasty returned to floor with no immediate issues.  Later found to be in Afib RVR. Metorpolol IV 2.5mg  ordered pt becamse confused, hypotensive and lost pulses per report  PAST MEDICAL HISTORY :   has a past medical history of CAD (coronary artery disease); Chronic kidney disease; GERD (gastroesophageal reflux disease); History of gout (09/28/2014); Hyperlipidemia; Hypertension; Osteoporosis; and Stroke (HCC).  has a past surgical history that includes Cardiac valve replacement (2009); Coronary artery bypass graft; and Hernia repair. Prior to Admission medications   Medication Sig Start Date End Date Taking? Authorizing Provider  acetaminophen (TYLENOL) 500 MG tablet Take 1,000 mg by mouth every 6 (six) hours as  needed for mild pain.   Yes [provider]  allopurinol (ZYLOPRIM) 100 MG tablet Take 1 tablet (100 mg total) by mouth daily. 06/05/14  Yes Short, Thea Silversmith, MD  aspirin 81 MG tablet Take 81 mg by mouth 2 (two) times daily.    Yes [provider]  atorvastatin (LIPITOR) 40 MG tablet Take 1 tablet (40 mg total) by mouth daily. 06/05/14  Yes Short, Thea Silversmith, MD  carvedilol (COREG) 12.5 MG tablet Take 12.5 mg by mouth 2 (two) times daily with a meal.   Yes [provider]  Cholecalciferol 1000 UNITS tablet Take 1,000 Units by mouth daily.     Yes [provider]  docusate sodium (COLACE) 100 MG capsule Take 200 mg by mouth 2 (two) times daily.   Yes [provider]  finasteride (PROSCAR) 5 MG tablet Take 5 mg by mouth daily.   Yes [provider]  HYDROmorphone (DILAUDID) 4 MG tablet Take 0.5-1 tablets (2-4 mg total) by mouth every 6 (six) hours as needed for severe pain. 09/01/17  Yes Plotnikov, Georgina Quint, MD  lidocaine (LIDODERM) 5 % Place 1 patch onto the skin as needed (for pain). Remove & Discard patch within 12 hours or as directed by MD    Yes [provider]  Magnesium 200 MG TABS 1 po qd Patient taking differently: Take 1 each by mouth daily.  10/23/14  Yes Plotnikov, Georgina Quint, MD  Multiple Vitamins-Minerals (OCUVITE PRESERVISION) TABS Take 2 tablets by mouth 2 (two) times daily.     Yes [provider]  omeprazole (PRILOSEC) 20 MG capsule Take 20 mg by mouth 2 (two)  times daily before a meal.    Yes [provider]  predniSONE (DELTASONE) 10 MG tablet 1 po qd Patient taking differently: Take 10 mg by mouth daily with breakfast.  09/01/17  Yes Plotnikov, Georgina Quint, MD  torsemide (DEMADEX) 20 MG tablet Take 1-2 tablets (20-40 mg total) by mouth daily. Patient taking differently: Take 20 mg by mouth daily.  04/24/17  Yes Plotnikov, Georgina Quint, MD  venlafaxine XR (EFFEXOR-XR) 75 MG 24 hr capsule Take 225 mg by mouth daily  with breakfast.   Yes [provider]   Allergies  Allergen Reactions  . Gabapentin     jumpy    FAMILY HISTORY:  family history includes Cancer (age of onset: 6) in his mother; Heart disease in his father. SOCIAL HISTORY:  reports that he has quit smoking. He has never used smokeless tobacco. He reports that he does not drink alcohol or use drugs.  REVIEW OF SYSTEMS:  Unable to obtain due to clinical condition Constitutional: Negative for fever, chills, weight loss, malaise/fatigue and diaphoresis.  HENT: Negative for hearing loss, ear pain, nosebleeds, congestion, sore throat, neck pain, tinnitus and ear discharge.   Eyes: Negative for blurred vision, double vision, photophobia, pain, discharge and redness.  Respiratory: Negative for cough, hemoptysis, sputum production, shortness of breath, wheezing and stridor.   Cardiovascular: Negative for chest pain, palpitations, orthopnea, claudication, leg swelling and PND.  Gastrointestinal: Negative for heartburn, nausea, vomiting, abdominal pain, diarrhea, constipation, blood in stool and melena.  Genitourinary: Negative for dysuria, urgency, frequency, hematuria and flank pain.  Musculoskeletal: Negative for myalgias, back pain, joint pain and falls.  Skin: Negative for itching and rash.  Neurological: Negative for dizziness, tingling, tremors, sensory change, speech change, focal weakness, seizures, loss of consciousness, weakness and headaches.  Endo/Heme/Allergies: Negative for environmental allergies and polydipsia. Does not bruise/bleed easily.  SUBJECTIVE:   VITAL SIGNS: Temp:  [97.5 F (36.4 C)-99.3 F (37.4 C)] 98.6 F (37 C) (09/22 2015) Pulse Rate:  [93-114] 101 (09/22 2100) Resp:  [16-47] 20 (09/22 2015) BP: (90-183)/(50-104) 123/50 (09/22 2100) SpO2:  [87 %-99 %] 93 % (09/22 2015) Weight:  [77 kg (169 lb 12.1 oz)] 77 kg (169 lb 12.1 oz) (09/22 0457)  PHYSICAL EXAMINATION: General:  Well nourished  male Neuro: arousable to painful stimuli, to voice   HEENT:  Normocephalic atraumatic ETT in place Cardiovascular: S1 and S2 NSR Lungs:  Clear to auscultation bilateral with vent assoc sounds Abdomen:  Soft + Bs Musculoskeletal: no deformities. Skin: dressing over right hip clean and intact.   Recent Labs Lab 02-Oct-2017 2002 Oct 02, 2017 2007 09/04/2017 1416  NA 140 141 141  K 5.0 4.8 4.6  CL 106 108 111  CO2 18*  --  20*  BUN 42* 42* 38*  CREATININE 1.67* 1.60* 1.80*  GLUCOSE 133* 127* 113*    Recent Labs Lab 10/02/2017 2002 2017-10-02 2007 09/27/2017 1426  HGB 12.1* 12.2* 10.3*  HCT 37.4* 36.0* 32.6*  WBC 14.6*  --  22.1*  PLT 156  --  117*   Ct Angio Head W Or Wo Contrast  Result Date: 10/02/17 CLINICAL DATA:  Initial evaluation for acute aphasia. EXAM: CT ANGIOGRAPHY HEAD AND NECK TECHNIQUE: Multidetector CT imaging of the head and neck was performed using the standard protocol during bolus administration of intravenous contrast. Multiplanar CT image reconstructions and MIPs were obtained to evaluate the vascular anatomy. Carotid stenosis measurements (when applicable) are obtained utilizing NASCET criteria, using the distal internal carotid diameter as the  denominator. CONTRAST:  50 cc of Isovue 370. COMPARISON:  Prior CT from earlier same day. FINDINGS: CTA NECK FINDINGS Aortic arch: Visualized aortic arch of normal caliber with with normal branch pattern. Scattered atheromatous plaque present about the arch itself as well as the origin of the great vessels without flow-limiting stenosis. Visualized subclavian artery is widely patent. Right carotid system: Right common carotid artery patent from its origin to the bifurcation. Calcified noncalcified plaque about the right bifurcation/proximal right ICA. Associated stenosis of up to 70% by NASCET criteria. Right ICA patent distally to the skullbase without stenosis. Left carotid system: Left common carotid artery tortuous proximally but  widely patent to the bifurcation. Calcified plaque about the left bifurcation without flow-limiting stenosis. Left ICA patent distally to the skullbase without stenosis or occlusion. Vertebral arteries: Both vertebral arteries arise from the subclavian arteries. Vertebral artery's tortuous with scattered atheromatous irregularity without high-grade flow-limiting stenosis. Vertebral arteries are patent to the skullbase. Skeleton: No acute osseus abnormality. No worrisome lytic or blastic osseous lesions. Advanced degenerative spondylolysis throughout the cervical spine. Grade 1 anterolisthesis of C2 on C3 and C7 on T1. Other neck: No acute soft tissue abnormality within the neck. Salivary glands demonstrate no acute abnormality. Thyroid within normal limits. No adenopathy. Upper chest: Circumferential wall thickening about the visualized upper esophagus. Visualized upper mediastinum otherwise within normal limits. Partially visualized lungs are clear. Review of the MIP images confirms the above findings CTA HEAD FINDINGS Anterior circulation: Petrous segments widely patent bilaterally. Smooth atheromatous plaque throughout the cavernous/ supraclinoid ICAs with moderate diffuse narrowing. Changes most notable at the supraclinoid right ICA. ICA termini patent bilaterally. A1 segments irregular but patent without stenosis. Patent anterior communicating artery. Multifocal atheromatous irregularity within the A2 segments bilaterally, left greater than right, without high-grade stenosis. ACAs are patent to their distal aspects. Right M1 segment somewhat irregular but patent to its distal aspect without flow-limiting stenosis. Right MCA bifurcation normal. Proximal M2 stenoses and irregularity without occlusion. MCA branches demonstrate extensive atheromatous irregularity but are perfused to their distal aspects. Left M1 segment patent without stenosis or occlusion. Proximal and 2 stenoses and irregularity without  occlusion. Distal left MCA branches are extensively irregular but patent to their distal aspects. Posterior circulation: Multifocal atheromatous plaque within the bilateral V4 segments with mild to moderate multifocal narrowing. Left vertebral artery dominant. Posterior inferior cerebral arteries are patent proximally. Basilar artery irregular but patent to its distal aspect without high-grade stenosis. Superior cerebral arteries patent bilaterally. Right PCA supplied via the basilar and is widely patent to its distal aspect with moderate atheromatous irregularity. Fetal type origin of the left PCA supplied via a patent left posterior communicating artery. Left PCA also patent to its distal aspect without flow-limiting stenosis with moderate multifocal atheromatous irregularity. Venous sinuses: Not well evaluated due to arterial timing of the contrast bolus. Anatomic variants: Fetal type origin of the left PCA. No other significant. Delayed phase: Not performed. Review of the MIP images confirms the above findings IMPRESSION: 1. Negative CTA for emergent large vessel occlusion. 2. Atheromatous plaque at the right carotid bifurcation with associated stenosis of up to 70% by NASCET criteria. 3. Additional extensive atheromatous disease involving the medium and small vessels of the head and neck as above. No other high-grade or correctable stenosis identified. 4. Diffuse esophageal wall thickening about the visualized upper esophagus. Considerations include sequelae of reflux disease or possibly acute esophagitis. Critical Value/emergent results were called by telephone at the time of interpretation on 09/12/2017 at  8:31 pm to Dr. Noel Christmas , who verbally acknowledged these results. Electronically Signed   By: Rise Mu M.D.   On: 09/09/2017 21:07   Dg Chest 1 View  Result Date: 08/31/2017 CLINICAL DATA:  Fall with pain. EXAM: CHEST 1 VIEW COMPARISON:  04/13/2017 FINDINGS: Lungs are adequately  inflated with mild elevation of the left hemidiaphragm unchanged. No focal lobar consolidation or effusion. No pneumothorax. Mild stable cardiomegaly. Calcified plaque over the aortic arch. Remainder of the exam is unchanged. IMPRESSION: No acute cardiopulmonary disease. Stable cardiomegaly. Aortic Atherosclerosis (ICD10-I70.0). Electronically Signed   By: Elberta Fortis M.D.   On: 08/29/2017 21:47   Dg Elbow Complete Right (3+view)  Result Date: 09/23/2017 CLINICAL DATA:  Fall with right elbow pain. EXAM: RIGHT ELBOW - COMPLETE 3+ VIEW COMPARISON:  None. FINDINGS: Lateral film is not optimal. No evidence of acute fracture or dislocation. No significant joint effusion. IMPRESSION: No acute findings. Electronically Signed   By: Elberta Fortis M.D.   On: 09/17/2017 21:49   Ct Angio Neck W Or Wo Contrast  Result Date: 09/14/2017 CLINICAL DATA:  Initial evaluation for acute aphasia. EXAM: CT ANGIOGRAPHY HEAD AND NECK TECHNIQUE: Multidetector CT imaging of the head and neck was performed using the standard protocol during bolus administration of intravenous contrast. Multiplanar CT image reconstructions and MIPs were obtained to evaluate the vascular anatomy. Carotid stenosis measurements (when applicable) are obtained utilizing NASCET criteria, using the distal internal carotid diameter as the denominator. CONTRAST:  50 cc of Isovue 370. COMPARISON:  Prior CT from earlier same day. FINDINGS: CTA NECK FINDINGS Aortic arch: Visualized aortic arch of normal caliber with with normal branch pattern. Scattered atheromatous plaque present about the arch itself as well as the origin of the great vessels without flow-limiting stenosis. Visualized subclavian artery is widely patent. Right carotid system: Right common carotid artery patent from its origin to the bifurcation. Calcified noncalcified plaque about the right bifurcation/proximal right ICA. Associated stenosis of up to 70% by NASCET criteria. Right ICA patent  distally to the skullbase without stenosis. Left carotid system: Left common carotid artery tortuous proximally but widely patent to the bifurcation. Calcified plaque about the left bifurcation without flow-limiting stenosis. Left ICA patent distally to the skullbase without stenosis or occlusion. Vertebral arteries: Both vertebral arteries arise from the subclavian arteries. Vertebral artery's tortuous with scattered atheromatous irregularity without high-grade flow-limiting stenosis. Vertebral arteries are patent to the skullbase. Skeleton: No acute osseus abnormality. No worrisome lytic or blastic osseous lesions. Advanced degenerative spondylolysis throughout the cervical spine. Grade 1 anterolisthesis of C2 on C3 and C7 on T1. Other neck: No acute soft tissue abnormality within the neck. Salivary glands demonstrate no acute abnormality. Thyroid within normal limits. No adenopathy. Upper chest: Circumferential wall thickening about the visualized upper esophagus. Visualized upper mediastinum otherwise within normal limits. Partially visualized lungs are clear. Review of the MIP images confirms the above findings CTA HEAD FINDINGS Anterior circulation: Petrous segments widely patent bilaterally. Smooth atheromatous plaque throughout the cavernous/ supraclinoid ICAs with moderate diffuse narrowing. Changes most notable at the supraclinoid right ICA. ICA termini patent bilaterally. A1 segments irregular but patent without stenosis. Patent anterior communicating artery. Multifocal atheromatous irregularity within the A2 segments bilaterally, left greater than right, without high-grade stenosis. ACAs are patent to their distal aspects. Right M1 segment somewhat irregular but patent to its distal aspect without flow-limiting stenosis. Right MCA bifurcation normal. Proximal M2 stenoses and irregularity without occlusion. MCA branches demonstrate extensive atheromatous irregularity but are  perfused to their distal  aspects. Left M1 segment patent without stenosis or occlusion. Proximal and 2 stenoses and irregularity without occlusion. Distal left MCA branches are extensively irregular but patent to their distal aspects. Posterior circulation: Multifocal atheromatous plaque within the bilateral V4 segments with mild to moderate multifocal narrowing. Left vertebral artery dominant. Posterior inferior cerebral arteries are patent proximally. Basilar artery irregular but patent to its distal aspect without high-grade stenosis. Superior cerebral arteries patent bilaterally. Right PCA supplied via the basilar and is widely patent to its distal aspect with moderate atheromatous irregularity. Fetal type origin of the left PCA supplied via a patent left posterior communicating artery. Left PCA also patent to its distal aspect without flow-limiting stenosis with moderate multifocal atheromatous irregularity. Venous sinuses: Not well evaluated due to arterial timing of the contrast bolus. Anatomic variants: Fetal type origin of the left PCA. No other significant. Delayed phase: Not performed. Review of the MIP images confirms the above findings IMPRESSION: 1. Negative CTA for emergent large vessel occlusion. 2. Atheromatous plaque at the right carotid bifurcation with associated stenosis of up to 70% by NASCET criteria. 3. Additional extensive atheromatous disease involving the medium and small vessels of the head and neck as above. No other high-grade or correctable stenosis identified. 4. Diffuse esophageal wall thickening about the visualized upper esophagus. Considerations include sequelae of reflux disease or possibly acute esophagitis. Critical Value/emergent results were called by telephone at the time of interpretation on 09/12/2017 at 8:31 pm to Dr. Noel Christmas , who verbally acknowledged these results. Electronically Signed   By: Rise Mu M.D.   On: 09/21/2017 21:07   Dg Pelvis Portable  Result Date:  09/16/2017 CLINICAL DATA:  Femoral neck fracture. EXAM: PORTABLE PELVIS 1-2 VIEWS COMPARISON:  09/03/2017 FINDINGS: Patient now has a right hip arthroplasty. The arthroplasty appears located on this single view. Pelvic bony ring is intact. Multiple phleboliths in the pelvis. Again noted is mild joint space narrowing in the left hip joint. Lucency in the right upper leg soft tissues compatible with recent surgery. IMPRESSION: Right hip arthroplasty without complicating features. Electronically Signed   By: Richarda Overlie M.D.   On: 09/22/2017 13:40   Dg Humerus Right  Result Date: 09/05/2017 CLINICAL DATA:  Fall with pain. EXAM: RIGHT HUMERUS - 2+ VIEW COMPARISON:  None. FINDINGS: Diffuse osteopenia. Mild degenerate change of the glenohumeral joint and AC joint. No acute fracture or dislocation. IMPRESSION: No acute findings. Electronically Signed   By: Elberta Fortis M.D.   On: 09/20/2017 21:48   Mr Brain Limited Wo Contrast  Result Date: 09/26/2017 CLINICAL DATA:  Found in driveway. Aphasic. Assess for cerebellar stroke. EXAM: MRI HEAD WITHOUT CONTRAST MRA HEAD WITHOUT CONTRAST TECHNIQUE: Sagittal, axial and coronal diffusion weighted imaging of the brain ; per technologist note, patient refused further imaging. Angiographic images of the head were obtained using MRA technique without contrast. COMPARISON:  CT/CTA HEAD September 18, 2017 and MRI/MRA of the head June 04, 2014 FINDINGS: MRI HEAD FINDINGS BRAIN: No reduced diffusion to suggest acute ischemia, hypercellular tumor or typical infection. Chronic microhemorrhage RIGHT cerebellum present on prior MRI. Ventricles and sulci are normal for patient's age. No midline shift, mass effect or definite masses though limited assessment. No large extra-axial fluid collections. VASCULAR: Not assessed. SKULL AND UPPER CERVICAL SPINE: No abnormal sellar expansion. SINUSES/ORBITS: Not assessed. OTHER: None. MRA HEAD FINDINGS- moderately motion degraded examination.  ANTERIOR CIRCULATION: Normal flow related enhancement of the included cervical, petrous, cavernous and supraclinoid internal  carotid arteries. Patent anterior communicating artery. Patent anterior and middle cerebral arteries, including distal segments. POSTERIOR CIRCULATION: Codominant vertebral artery's. Basilar artery is patent, with normal flow related enhancement of the main branch vessels. Patent posterior cerebral arteries. Fetal origin of LEFT posterior cerebral artery. ANATOMIC VARIANTS: None.Source images and MIP images were reviewed. IMPRESSION: MRI HEAD: 1. Limited 3 sequence MRI of the head: No acute intracranial process. MRA HEAD: 1. No emergent large vessel occlusion or flow limiting stenosis on this moderately motion degraded examination. Electronically Signed   By: Awilda Metro M.D.   On: 09/23/2017 01:50   Mr Maxine Glenn Head Wo Contrast  Result Date: 09/05/2017 CLINICAL DATA:  Found in driveway. Aphasic. Assess for cerebellar stroke. EXAM: MRI HEAD WITHOUT CONTRAST MRA HEAD WITHOUT CONTRAST TECHNIQUE: Sagittal, axial and coronal diffusion weighted imaging of the brain ; per technologist note, patient refused further imaging. Angiographic images of the head were obtained using MRA technique without contrast. COMPARISON:  CT/CTA HEAD Sep 25, 2017 and MRI/MRA of the head June 04, 2014 FINDINGS: MRI HEAD FINDINGS BRAIN: No reduced diffusion to suggest acute ischemia, hypercellular tumor or typical infection. Chronic microhemorrhage RIGHT cerebellum present on prior MRI. Ventricles and sulci are normal for patient's age. No midline shift, mass effect or definite masses though limited assessment. No large extra-axial fluid collections. VASCULAR: Not assessed. SKULL AND UPPER CERVICAL SPINE: No abnormal sellar expansion. SINUSES/ORBITS: Not assessed. OTHER: None. MRA HEAD FINDINGS- moderately motion degraded examination. ANTERIOR CIRCULATION: Normal flow related enhancement of the included  cervical, petrous, cavernous and supraclinoid internal carotid arteries. Patent anterior communicating artery. Patent anterior and middle cerebral arteries, including distal segments. POSTERIOR CIRCULATION: Codominant vertebral artery's. Basilar artery is patent, with normal flow related enhancement of the main branch vessels. Patent posterior cerebral arteries. Fetal origin of LEFT posterior cerebral artery. ANATOMIC VARIANTS: None.Source images and MIP images were reviewed. IMPRESSION: MRI HEAD: 1. Limited 3 sequence MRI of the head: No acute intracranial process. MRA HEAD: 1. No emergent large vessel occlusion or flow limiting stenosis on this moderately motion degraded examination. Electronically Signed   By: Awilda Metro M.D.   On: 09/08/2017 01:50   Dg Hip Unilat With Pelvis 2-3 Views Right  Result Date: Sep 25, 2017 CLINICAL DATA:  Fall with pain. EXAM: DG HIP (WITH OR WITHOUT PELVIS) 2-3V RIGHT COMPARISON:  None. FINDINGS: There is diffuse osteopenia. There mild degenerate changes of the hips. There is a displaced fracture of the right femoral neck likely subcapital in location. Superior displacement of the distal fragment. Hardware over the lumbosacral spine. Several pelvic phleboliths. IMPRESSION: Displaced right femoral neck fracture. Electronically Signed   By: Elberta Fortis M.D.   On: Sep 25, 2017 21:46   Ct Head Code Stroke Wo Contrast  Result Date: 09/25/17 CLINICAL DATA:  Code stroke. Initial evaluation for acute aphasia. Weakness. EXAM: CT HEAD WITHOUT CONTRAST TECHNIQUE: Contiguous axial images were obtained from the base of the skull through the vertex without intravenous contrast. COMPARISON:  Prior MRI from 06/04/2014. FINDINGS: Brain: Generalized age related cerebral atrophy with chronic small vessel ischemic disease. Patchy 13 mm hypodensity within the inferior left cerebellar hemisphere, suspicious for evolving acute ischemic infarct (series 3, image 5). No other definite acute  large vessel territory infarct. No acute intracranial hemorrhage. No mass lesion or midline shift. No hydrocephalus. No extra-axial fluid collection. Vascular: Question asymmetric hyperdensity involving the right M1 segment. Prominent vascular calcifications within the carotid siphons. Skull: Scalp soft tissues and calvarium within normal limits. Sinuses/Orbits: Globes and orbital soft  tissues normal. Patient status post lens extraction bilaterally. Chronic right sphenoid sinus disease. Paranasal sinuses otherwise clear. No mastoid effusion. Other: None. ASPECTS [facility] Stroke Program Early CT Score) - Ganglionic level infarction (caudate, lentiform nuclei, internal capsule, insula, M1-M3 cortex): 7 - Supraganglionic infarction (M4-M6 cortex): 3 Total score (0-10 with 10 being normal): 10 IMPRESSION: 1. Question asymmetric hyperdensity within the right M1 segment/dense M1, which may reflect intravascular thrombus. Further evaluation with CTA recommended. No definite evidence for evolving left MCA territory ischemia. No acute intracranial hemorrhage. 2. ASPECTS is 10 3. Patchy hypodensity within the inferior left cerebellar hemisphere, suspicious for evolving acute left PICA territory infarct. 4. Atrophy with chronic small vessel ischemic disease. Critical Value/emergent results were called by telephone at the time of interpretation on 09/13/2017 at 8:31 pm to Dr. Roseanne Reno , who verbally acknowledged these results. Electronically Signed   By: Rise Mu M.D.   On: 09/23/2017 20:31     ASSESSMENT / PLAN:  NEURO: H/o Falls Prior h/o CVA Chronic microhemorrhage of the Right Cerebellum no acute intracranial pathology Neurology following Pt became altered after receiving metorprolol IV After push of epinephrine moving all extremities per staff Requiring sedation trying to pull tube RASS-1 Started on fentanyl ggt and versed pushes as needed No seizure activity  Continues on Aspirin 81 mg    CARDIAC: S/p cardiac arrest Most likely due to beta blockade Responded with one Epi ? If he truly lost pulses or was just severely brady Reviewed CV strips seen above  H/o HTN HLD, CAD Bioprosthetic Aortic valve Currently on Vasopressors Levophed ggt  MAP goal >21mmHg  Afib RVR prior to code Hold all beta blockade Hold all anti hypertensives No Anticoagulation due to recent hemiarthroplasty Currently HR in 90s   PULMONARY: Airway protection  Cardiopulmonary arrest Intubated on Mechanical ventilation PRVC 8cc/kg  CXR shows ETT in good position PEEP initially at 8 weaned to 5 ABG 7.13/41/100/13 Metabolic acidosis  Increased RR to compensate   ID: No recent cultures  On perioperative antibitoics Ancef 2g IV No suspicion of acute infection   Endocrine: H/o IDDM BG goal 140-180mg /dl On ICU Glycemic protocol Was on Prednisone 10mg  daily Will continue through OGT.  GI: OGT in place Can receive PO meds If patient remains intubated  assess for TF  Heme: no signs of Any active bleeding DVT PPx-> SCDs only now pt is POD 0 Per Ortho note ok for eliquis on POD 1  RENAL AG metabolic Acidosis  Lactic Acid 2.6 Reviewed chart (care everywhere) no history of CKD) Acute Kidney Injury Most recent creatinine 2,49 Continue IVF Lab Results  Component Value Date   CREATININE 1.80 (H) 09/06/2017   CREATININE 1.60 (H) 09/02/2017   CREATININE 1.67 (H) 08/31/2017  Calcium corrected for albumin is  7.9  orders placed for correction of  electrolytes as needed  .[name] DISPOSITION: ICU CODE STATUS: Full POA: son CC TIME:   I, Dr Newell Coral have personally reviewed patient's available data, including medical history, events of note, physical examination and test results as part of my evaluation. I have discussed with other care providers such as pharmacist, RN and RRT.  In addition,  An Advanced Directive Dated 25th July 2014 was provided by family including  POA/Healthcare proxy. Authorizes the withholding or withdrawal of life saving measures in the event of incurable or irreversible conditions , advanced dementia, and inability to regain consciousness. The patient is critically ill with multiple organ systems failure and requires high complexity decision making  for assessment and support, frequent evaluation and titration of therapies, application of advanced monitoring technologies and extensive interpretation of multiple databases.  Critical Care Time devoted to patient care services described in this note is 55 Minutes. This time reflects time of care of this signee Dr Newell Coral. This critical care time does not reflect procedure time, or teaching time or supervisory time but could involve care discussion time    Signed Dr Newell Coral Pulmonary Critical Care Locums Pulmonary and Critical Care Medicine [facility] Pager: [phone]  09/27/2017, 9:36 PM

## 2017-09-19 NOTE — Progress Notes (Signed)
Initial Nutrition Assessment  INTERVENTION:   Supplement diet as appropriate.    NUTRITION DIAGNOSIS:   Increased nutrient needs related to wound healing as evidenced by estimated needs.  GOAL:   Patient will meet greater than or equal to 90% of their needs   MONITOR:   Diet advancement, I & O's  REASON FOR ASSESSMENT:   Consult Hip fracture protocol  ASSESSMENT:   Pt with PMH of TIA, HTN, CAD s/p CABG, AVR s/p pig valve, CKD stage 3 admitted after fall/TIA with closed hip fx now s/p repair.    Weight appears stable per chart review.  Medications reviewed and include: vitamin D, colace, MVI, prednisone IVF: NS @ 75 ml/hr Labs reviewed: magnesium 1.4 (L)  Unable to complete Nutrition-Focused physical exam at this time due to surgery.    Diet Order:  Diet NPO time specified Except for: Sips with Meds  Skin:     Last BM:  unknown  Height:   Ht Readings from Last 1 Encounters:  09/01/17 5' 10.5" (1.791 m)    Weight:   Wt Readings from Last 1 Encounters:  09/09/2017 169 lb 12.1 oz (77 kg)    Ideal Body Weight:  75.4 kg  BMI:  Body mass index is 24.01 kg/m.  Estimated Nutritional Needs:   Kcal:  1900-2100  Protein:  85-100 grams  Fluid:  > 1.9 L/day  EDUCATION NEEDS:   No education needs identified at this time  Kendell Bane RD, LDN, CNSC [phone] Pager [phone] After Hours Pager

## 2017-09-19 NOTE — Progress Notes (Signed)
Approx 19:30 notified by telemetry Pt in rapid A.Fib with HR 130-140's. MD notified and ordered 2.5 mg metoprolol IV push. Medication given. HR decreased to 90-105 A.fib with BP 123/50. Left room to get Pt scheduled medications when son came to nurse's desk stating Pt is "having a panic attack"  Upon entering room Pt very anxious stating "i'm hot" and trying to get out of bed. Rapid response and charge nurse called.  Pt became diaphoretic and pale, laid back in bed and began agonal breathing. Code blue called. CPR began.

## 2017-09-19 NOTE — Progress Notes (Signed)
eLink Physician-Brief Progress Note Patient Name: Adam Weeks. DOB: June 18, 1927 MRN: [id number]   Date of Service  09/21/2017  HPI/Events of Note  Patient is on a Norepinephrine IV infusion. No order for it.   eICU Interventions  Will order: 1. Norepinephrine IV infusion. Titrate to MAP > 65.     Intervention Category Major Interventions: Hypotension - evaluation and management  Lenell Antu September 21, 2017, 11:32 PM

## 2017-09-19 NOTE — Transfer of Care (Signed)
Immediate Anesthesia Transfer of Care Note  Patient: Adam Weeks  Procedure(s) Performed: Procedure(s): ARTHROPLASTY BIPOLAR HIP (HEMIARTHROPLASTY) (Right)  Patient Location: PACU  Anesthesia Type:General  Level of Consciousness: awake and patient cooperative  Airway & Oxygen Therapy: Patient Spontanous Breathing and Patient connected to face mask oxygen  Post-op Assessment: Report given to RN and Post -op Vital signs reviewed and stable  Post vital signs: Reviewed and stable  Last Vitals:  Vitals:   09/24/2017 0308 09/14/2017 0457  BP: (!) 179/103 (!) 151/79  Pulse: (!) 109 (!) 113  Resp: (!) 22 20  Temp: (!) 36.4 C 37.2 C  SpO2: 96% 95%    Last Pain:  Vitals:   09/17/2017 0457  TempSrc: Oral         Complications: No apparent anesthesia complications

## 2017-09-19 NOTE — Code Documentation (Signed)
  Patient Name: Adam Weeks.   MRN: [id number]   Date of Birth/ Sex: 1927-09-14 , male      Admission Date: 10-06-2017  Attending Provider: Rodolph Bong, MD  Primary Diagnosis: Closed right hip fracture, initial encounter [facility])   Indication: Pt was in his usual state of health until this PM, when he was noted to be altered. Nursing noted possible a fib with RVR however could not get pulse. Telemetry review per rapid response appeared to show bradycardia to 30s. Code blue was subsequently called. At the time of arrival on scene, ACLS protocol was underway.   Technical Description:  - CPR performance duration:  5 minutes minutes  - Was defibrillation or cardioversion used? No   - Was external pacer placed? No  - Was patient intubated pre/post CPR? Yes, post   Medications Administered: Y = Yes; Blank = No Amiodarone    Atropine    Calcium    Epinephrine  1  Lidocaine    Magnesium    Norepinephrine    Phenylephrine    Sodium bicarbonate    Vasopressin     Started on levophed gtt  Post CPR evaluation:  - Final Status - Was patient successfully resuscitated ? Yes - What is current rhythm? sinus - What is current hemodynamic status? Guarded, on pressors  Miscellaneous Information:  - Labs sent, including: Unable to obtain blood sample  - Primary team notified?  Yes  - Family Notified? Yes, present at bedside  - Additional notes/ transfer status: Patient transferred to ICU, ICU team at bedside      Nyra Market, MD  09/24/2017, 10:28 PM

## 2017-09-19 NOTE — Progress Notes (Signed)
PT Cancellation Note  Patient Details Name: Adam Weeks. MRN: [id number] DOB: [birth year]   Cancelled Treatment:    Reason Eval/Treat Not Completed: Medical issues which prohibited therapy. Pt currently with bed rest orders and plan for OR ?today. PT will continue to f/u with pt and await updated activity orders.   Alessandra Bevels [name] 09/08/2017, 8:03 AM

## 2017-09-19 NOTE — Anesthesia Preprocedure Evaluation (Signed)
Anesthesia Evaluation  Patient identified by MRN, date of birth, ID band Patient awake    Reviewed: Allergy & Precautions, NPO status , Patient's Chart, lab work & pertinent test results  Airway Mallampati: II  TM Distance: >3 FB Neck ROM: Full    Dental  (+) Dental Advisory Given   Pulmonary former smoker,    breath sounds clear to auscultation       Cardiovascular hypertension, Pt. on medications + CAD and + CABG (+AV replacement)   Rhythm:Regular Rate:Normal     Neuro/Psych TIA (Spoke with on call Neurologist and scans consistent with previous infarct but nothing acute on imaging. Pt neurologically cleared per on-call physician.)CVA    GI/Hepatic Neg liver ROS, GERD  ,  Endo/Other  negative endocrine ROS  Renal/GU Renal disease     Musculoskeletal  (+) Arthritis ,   Abdominal   Peds  Hematology  (+) anemia ,   Anesthesia Other Findings   Reproductive/Obstetrics                             Lab Results  Component Value Date   WBC 14.6 (H) 09/03/2017   HGB 12.2 (L) 08/31/2017   HCT 36.0 (L) 09/21/2017   MCV 101.4 (H) 09/12/2017   PLT 156 09/07/2017   Lab Results  Component Value Date   CREATININE 1.60 (H) 08/31/2017   BUN 42 (H) 09/02/2017   NA 141 09/22/2017   K 4.8 09/25/2017   CL 108 08/31/2017   CO2 18 (L) 09/01/2017    Anesthesia Physical Anesthesia Plan  ASA: IV  Anesthesia Plan: General   Post-op Pain Management:    Induction: Intravenous  PONV Risk Score and Plan: 2 and Ondansetron, Dexamethasone and Treatment may vary due to age or medical condition  Airway Management Planned: Oral ETT  Additional Equipment: Arterial line  Intra-op Plan:   Post-operative Plan: Possible Post-op intubation/ventilation and Extubation in OR  Informed Consent: I have reviewed the patients History and Physical, chart, labs and discussed the procedure including the risks,  benefits and alternatives for the proposed anesthesia with the patient or authorized representative who has indicated his/her understanding and acceptance.   Dental advisory given  Plan Discussed with: CRNA  Anesthesia Plan Comments:         Anesthesia Quick Evaluation

## 2017-09-19 NOTE — Progress Notes (Signed)
Unable to complete q2h neuro checks because patient was in surgery during this time. Madelin Rear, MSN, RN, [employer]

## 2017-09-19 NOTE — Anesthesia Postprocedure Evaluation (Signed)
Anesthesia Post Note  Patient: Adam Weeks.  Procedure(s) Performed: Procedure(s) (LRB): ARTHROPLASTY BIPOLAR HIP (HEMIARTHROPLASTY) (Right)     Patient location during evaluation: PACU Anesthesia Type: General Level of consciousness: awake and alert Pain management: pain level controlled Vital Signs Assessment: post-procedure vital signs reviewed and stable Respiratory status: spontaneous breathing, nonlabored ventilation, respiratory function stable and patient connected to nasal cannula oxygen Cardiovascular status: blood pressure returned to baseline and stable Postop Assessment: no apparent nausea or vomiting Anesthetic complications: no    Last Vitals:  Vitals:   10/05/2017 1306 2017/10/05 1321  BP: (!) 117/54 (!) 106/57  Pulse: (!) 109 (!) 102  Resp: (!) 47 (!) 33  Temp:    SpO2: 96% 91%    Last Pain:  Vitals:   October 05, 2017 0457  TempSrc: Osvaldo Angst                 Kennieth Rad

## 2017-09-19 NOTE — ED Notes (Addendum)
Daric Koren (son): [phone]

## 2017-09-19 NOTE — Consult Note (Signed)
Orthopaedic Trauma Service (OTS) Consult   Patient ID: Adam Weeks. MRN: [id number] DOB/AGE: [birth year] 83 y.o.   Reason for Consult:Right hip fracture Referring Physician: Dr. Lyda Perone  HPI: Adam Weeks. is an 83 y.o. male who is being seen in consultation at the request of Dr. Julian Reil for evaluation of right hip fracture.Adam Weeks has a history of stroke, hypertension, hyperlipidemia, coronary artery disease, chronic kidney disease and osteoporosis brought to the ED after being found down on his driveway. Patient was noted to have receptive and expressive aphasia. He also sustained trauma to his right extremities and was complaining of severe pain. He was evaluated by the neurology team and was normal upon exam. MRI of his brain was performed which showed no signs of stroke. I was consulted for his right femoral neck fracture.  Past Medical History:  Diagnosis Date  . CAD (coronary artery disease)   . Chronic kidney disease    stones  . GERD (gastroesophageal reflux disease)   . History of gout 09/28/2014   2014  . Hyperlipidemia   . Hypertension   . Osteoporosis   . Stroke [facility])    TIA    Past Surgical History:  Procedure Laterality Date  . CARDIAC VALVE REPLACEMENT  2009   pig valve -- AVR  . CORONARY ARTERY BYPASS GRAFT     2009  . HERNIA REPAIR      Family History  Problem Relation Age of Onset  . Cancer Mother 98       breast ca  . Heart disease Father        CAD    Social History:  reports that he has quit smoking. He has never used smokeless tobacco. He reports that he does not drink alcohol or use drugs.  Allergies:  Allergies  Allergen Reactions  . Gabapentin     jumpy    Medications: I have reviewed the patient's current medications.  ROS: Negative other than noted above  Blood pressure (!) 151/79, pulse (!) 113, temperature 99 F (37.2 C), temperature source Oral, resp. rate 20, weight 77 kg (169 lb 12.1 oz), SpO2 95 %. Exam: Awake and  alert, NAD RLE: unable to move leg, having significant pain. Active DF/PF, EHL/FHL, Sensation intact to light touch in all nerve distributions. Warm and well perfused foot BUE/LLE: Skin without lesions, no tenderness to palpation, full painless ROM with full strength in each muscle groups without evidence of instability.  Imaging: X-rays show a displaced right femoral neck fracture  Assessment/Plan: 83 year old male with right displaced femoral neck fracture  -Plan to proceed with hemiarthroplasty. -Discussed care with patient and with his son over the phone. -Risks included bleeding, infection, dislocation, periprosthetic fracture, nerve and blood vessel injury, acetabular wear, PE/DVT, and even death. In light of this they agreed to proceed with surgery.   Roby Lofts, MD [facility] [phone] (phone)

## 2017-09-20 ENCOUNTER — Inpatient Hospital Stay (HOSPITAL_COMMUNITY): Payer: Medicare Other

## 2017-09-20 DIAGNOSIS — R001 Bradycardia, unspecified: Secondary | ICD-10-CM

## 2017-09-20 DIAGNOSIS — I361 Nonrheumatic tricuspid (valve) insufficiency: Secondary | ICD-10-CM

## 2017-09-20 DIAGNOSIS — Z951 Presence of aortocoronary bypass graft: Secondary | ICD-10-CM

## 2017-09-20 DIAGNOSIS — Z952 Presence of prosthetic heart valve: Secondary | ICD-10-CM

## 2017-09-20 DIAGNOSIS — I469 Cardiac arrest, cause unspecified: Secondary | ICD-10-CM

## 2017-09-20 DIAGNOSIS — R4701 Aphasia: Secondary | ICD-10-CM

## 2017-09-20 LAB — BLOOD GAS, ARTERIAL
ACID-BASE DEFICIT: 5.7 mmol/L — AB (ref 0.0–2.0)
Acid-base deficit: 7.7 mmol/L — ABNORMAL HIGH (ref 0.0–2.0)
Bicarbonate: 16.1 mmol/L — ABNORMAL LOW (ref 20.0–28.0)
Bicarbonate: 17.6 mmol/L — ABNORMAL LOW (ref 20.0–28.0)
DRAWN BY: 511331
Drawn by: 51133
FIO2: 80
FIO2: 40
LHR: 20 {breaths}/min
O2 SAT: 94.3 %
O2 Saturation: 98.7 %
PATIENT TEMPERATURE: 98.6
PATIENT TEMPERATURE: 98.6
PCO2 ART: 26.1 mmHg — AB (ref 32.0–48.0)
PCO2 ART: 25.4 mmHg — AB (ref 32.0–48.0)
PEEP/CPAP: 5 cmH2O
PEEP: 5 cmH2O
PH ART: 7.455 — AB (ref 7.350–7.450)
PO2 ART: 142 mmHg — AB (ref 83.0–108.0)
PO2 ART: 69.2 mmHg — AB (ref 83.0–108.0)
RATE: 26 resp/min
VT: 590 mL
VT: 590 mL
pH, Arterial: 7.406 (ref 7.350–7.450)

## 2017-09-20 LAB — ECHOCARDIOGRAM COMPLETE
AO mean calculated velocity dopler: 188 cm/s
AOASC: 34 cm
AV Area VTI: 1.13 cm2
AV Area mean vel: 1.23 cm2
AV Mean grad: 18 mmHg
AV VEL mean LVOT/AV: 0.48
AV pk vel: 302 cm/s
AVAREAMEANVIN: 0.63 cm2/m2
AVAREAVTIIND: 0.77 cm2/m2
AVPG: 36 mmHg
Ao pk vel: 0.44 m/s
CHL CUP AV PEAK INDEX: 0.58
CHL CUP AV VALUE AREA INDEX: 0.77
CHL CUP AV VEL: 1.52
EWDT: 201 ms
FS: 24 % — AB (ref 28–44)
IV/PV OW: 1.01
LA diam index: 2.04 cm/m2
LA vol A4C: 43.9 ml
LA vol index: 26.6 mL/m2
LA vol: 52.1 mL
LASIZE: 40 mm
LEFT ATRIUM END SYS DIAM: 40 mm
LV PW d: 15.1 mm — AB (ref 0.6–1.1)
LVOT SV: 63 mL
LVOT VTI: 24.9 cm
LVOT area: 2.54 cm2
LVOT diameter: 18 mm
LVOT peak VTI: 0.6 cm
LVOT peak grad rest: 7 mmHg
LVOTPV: 134 cm/s
MV Dec: 201
MV Peak grad: 4 mmHg
MV pk A vel: 102 m/s
MV pk E vel: 97 m/s
Reg peak vel: 326 cm/s
TAPSE: 19.7 mm
TRMAXVEL: 326 cm/s
VTI: 41.7 cm
Valve area: 1.52 cm2
Weight: 2716.07 oz

## 2017-09-20 LAB — GLUCOSE, CAPILLARY
GLUCOSE-CAPILLARY: 114 mg/dL — AB (ref 65–99)
GLUCOSE-CAPILLARY: 116 mg/dL — AB (ref 65–99)
Glucose-Capillary: 115 mg/dL — ABNORMAL HIGH (ref 65–99)
Glucose-Capillary: 106 mg/dL — ABNORMAL HIGH (ref 65–99)
Glucose-Capillary: 136 mg/dL — ABNORMAL HIGH (ref 65–99)
Glucose-Capillary: 145 mg/dL — ABNORMAL HIGH (ref 65–99)

## 2017-09-20 LAB — HEPARIN LEVEL (UNFRACTIONATED): HEPARIN UNFRACTIONATED: 0.51 [IU]/mL (ref 0.30–0.70)

## 2017-09-20 LAB — URINALYSIS, ROUTINE W REFLEX MICROSCOPIC
BILIRUBIN URINE: NEGATIVE
GLUCOSE, UA: NEGATIVE mg/dL
Ketones, ur: NEGATIVE mg/dL
NITRITE: NEGATIVE
PH: 5 (ref 5.0–8.0)
Protein, ur: 100 mg/dL — AB
SPECIFIC GRAVITY, URINE: 1.026 (ref 1.005–1.030)
Squamous Epithelial / LPF: NONE SEEN

## 2017-09-20 LAB — CBC
HEMATOCRIT: 31.5 % — AB (ref 39.0–52.0)
HEMOGLOBIN: 9.9 g/dL — AB (ref 13.0–17.0)
MCH: 31.7 pg (ref 26.0–34.0)
MCHC: 31.4 g/dL (ref 30.0–36.0)
MCV: 101 fL — AB (ref 78.0–100.0)
Platelets: 100 10*3/uL — ABNORMAL LOW (ref 150–400)
RBC: 3.12 MIL/uL — ABNORMAL LOW (ref 4.22–5.81)
RDW: 14 % (ref 11.5–15.5)
WBC: 24.4 10*3/uL — ABNORMAL HIGH (ref 4.0–10.5)

## 2017-09-20 LAB — MAGNESIUM
MAGNESIUM: 2.6 mg/dL — AB (ref 1.7–2.4)
Magnesium: 2.9 mg/dL — ABNORMAL HIGH (ref 1.7–2.4)
Magnesium: 2.4 mg/dL (ref 1.7–2.4)

## 2017-09-20 LAB — BASIC METABOLIC PANEL
Anion gap: 13 (ref 5–15)
BUN: 43 mg/dL — ABNORMAL HIGH (ref 6–20)
CHLORIDE: 108 mmol/L (ref 101–111)
CO2: 16 mmol/L — AB (ref 22–32)
CREATININE: 2.49 mg/dL — AB (ref 0.61–1.24)
Calcium: 7.5 mg/dL — ABNORMAL LOW (ref 8.9–10.3)
GFR calc non Af Amer: 22 mL/min — ABNORMAL LOW (ref >60)
GFR, EST AFRICAN AMERICAN: 25 mL/min — AB (ref >60)
Glucose, Bld: 119 mg/dL — ABNORMAL HIGH (ref 65–99)
Potassium: 4.5 mmol/L (ref 3.5–5.1)
Sodium: 137 mmol/L (ref 135–145)

## 2017-09-20 LAB — LACTIC ACID, PLASMA: LACTIC ACID, VENOUS: 2.6 mmol/L — AB (ref 0.5–1.9)

## 2017-09-20 LAB — PHOSPHORUS
PHOSPHORUS: 4.5 mg/dL (ref 2.5–4.6)
Phosphorus: 5.6 mg/dL — ABNORMAL HIGH (ref 2.5–4.6)

## 2017-09-20 LAB — TROPONIN I: Troponin I: 1.88 ng/mL (ref <0.03)

## 2017-09-20 LAB — MRSA PCR SCREENING: MRSA BY PCR: POSITIVE — AB

## 2017-09-20 MED ORDER — PRO-STAT SUGAR FREE PO LIQD
30.0000 mL | Freq: Two times a day (BID) | ORAL | Status: DC
  Administered 2017-09-20 (×2): 30 mL
  Filled 2017-09-20 (×3): qty 30

## 2017-09-20 MED ORDER — PERFLUTREN LIPID MICROSPHERE
1.0000 mL | INTRAVENOUS | Status: AC | PRN
  Administered 2017-09-20: 3 mL via INTRAVENOUS
  Filled 2017-09-20: qty 10

## 2017-09-20 MED ORDER — AMIODARONE HCL IN DEXTROSE 360-4.14 MG/200ML-% IV SOLN
60.0000 mg/h | INTRAVENOUS | Status: DC
  Administered 2017-09-20: 60 mg/h via INTRAVENOUS
  Filled 2017-09-20: qty 200

## 2017-09-20 MED ORDER — MIDAZOLAM HCL 2 MG/2ML IJ SOLN
3.0000 mg | INTRAMUSCULAR | Status: DC | PRN
  Administered 2017-09-20: 5 mg via INTRAVENOUS
  Administered 2017-09-21: 4 mg via INTRAVENOUS
  Filled 2017-09-20: qty 6
  Filled 2017-09-20: qty 4

## 2017-09-20 MED ORDER — AMIODARONE HCL IN DEXTROSE 360-4.14 MG/200ML-% IV SOLN
30.0000 mg/h | INTRAVENOUS | Status: DC
  Administered 2017-09-20: 30 mg/h via INTRAVENOUS
  Filled 2017-09-20 (×3): qty 200

## 2017-09-20 MED ORDER — CHLORHEXIDINE GLUCONATE CLOTH 2 % EX PADS
6.0000 | MEDICATED_PAD | Freq: Every day | CUTANEOUS | Status: AC
  Administered 2017-09-20 – 2017-09-24 (×6): 6 via TOPICAL

## 2017-09-20 MED ORDER — PANTOPRAZOLE SODIUM 40 MG PO PACK
40.0000 mg | PACK | Freq: Two times a day (BID) | ORAL | Status: DC
  Administered 2017-09-20 – 2017-09-23 (×6): 40 mg
  Filled 2017-09-20 (×9): qty 20

## 2017-09-20 MED ORDER — HYDROCORTISONE NA SUCCINATE PF 100 MG IJ SOLR
50.0000 mg | Freq: Four times a day (QID) | INTRAMUSCULAR | Status: DC
  Administered 2017-09-20 – 2017-09-22 (×9): 50 mg via INTRAVENOUS
  Filled 2017-09-20 (×6): qty 1
  Filled 2017-09-20: qty 2
  Filled 2017-09-20 (×2): qty 1

## 2017-09-20 MED ORDER — ORAL CARE MOUTH RINSE
15.0000 mL | Freq: Four times a day (QID) | OROMUCOSAL | Status: DC
  Administered 2017-09-20 – 2017-09-23 (×13): 15 mL via OROMUCOSAL

## 2017-09-20 MED ORDER — CHLORHEXIDINE GLUCONATE 0.12% ORAL RINSE (MEDLINE KIT)
15.0000 mL | Freq: Two times a day (BID) | OROMUCOSAL | Status: DC
  Administered 2017-09-20 – 2017-09-23 (×7): 15 mL via OROMUCOSAL

## 2017-09-20 MED ORDER — SODIUM CHLORIDE 0.9 % IV SOLN
2.0000 g | Freq: Once | INTRAVENOUS | Status: AC
  Administered 2017-09-20: 2 g via INTRAVENOUS
  Filled 2017-09-20: qty 20

## 2017-09-20 MED ORDER — MUPIROCIN 2 % EX OINT
1.0000 "application " | TOPICAL_OINTMENT | Freq: Two times a day (BID) | CUTANEOUS | Status: AC
  Administered 2017-09-20 – 2017-09-24 (×10): 1 via NASAL
  Filled 2017-09-20: qty 22

## 2017-09-20 MED ORDER — VITAL HIGH PROTEIN PO LIQD: 1000.0000 mL | ORAL | Status: DC

## 2017-09-20 MED ORDER — HEPARIN (PORCINE) IN NACL 100-0.45 UNIT/ML-% IJ SOLN
1050.0000 [IU]/h | INTRAMUSCULAR | Status: DC
  Administered 2017-09-20: 1100 [IU]/h via INTRAVENOUS
  Administered 2017-09-21 – 2017-09-22 (×2): 1050 [IU]/h via INTRAVENOUS
  Filled 2017-09-20 (×5): qty 250

## 2017-09-20 MED ORDER — DEXTROSE 5 % IV SOLN
INTRAVENOUS | Status: DC
  Administered 2017-09-20: 09:00:00 via INTRAVENOUS
  Filled 2017-09-20 (×2): qty 150

## 2017-09-20 NOTE — Progress Notes (Signed)
eLink Physician-Brief Progress Note Patient Name: Adam Weeks. DOB: 1927/07/21 MRN: [id number]   Date of Service  09/20/2017  HPI/Events of Note  Still over ventilating with pC02 25 on rate 20    eICU Interventions  Decrease Rate to 15      Intervention Category Major Interventions: Respiratory failure - evaluation and management  Sandrea Hughs 09/20/2017, 10:53 PM

## 2017-09-20 NOTE — Progress Notes (Signed)
  Echocardiogram 2D Echocardiogram has been performed.  Adam Weeks Adam Weeks 09/20/2017, 9:24 AM

## 2017-09-20 NOTE — Progress Notes (Signed)
Called to assess patient for AFIB and mental status changes, while en route to see the patient, CODE BLUE was called on patient.   See code sheet for details.   Patient taken to 2M05, was hypotensive, Levo started at SBP 68-70.

## 2017-09-20 NOTE — Progress Notes (Signed)
Brief Nutrition Note  Consult received for enteral/tube feeding initiation and management.  Adult Enteral Nutrition Protocol initiated. Full assessment to follow.  Admitting Dx: Aphasia [R47.01] Fracture [T14.8XXA] Pain [R52] Closed displaced fracture of right femoral neck (HCC) [S72.001A] Skin tear of right upper extremity [S41.111A]  Body mass index is 24.01 kg/m. Pt meets criteria for normal based on current BMI.  Labs:   Recent Labs Lab 09/25/2017 2002 09/17/2017 2007 2017-10-05 1416 09/20/17 0219 09/20/17 0908  NA 140 141 141 137  --   K 5.0 4.8 4.6 4.5  --   CL 106 108 111 108  --   CO2 18*  --  20* 16*  --   BUN 42* 42* 38* 43*  --   CREATININE 1.67* 1.60* 1.80* 2.49*  --   CALCIUM 8.8*  --  7.8* 7.5*  --   MG  --   --  1.4* 2.9* 2.4  PHOS  --   --   --   --  4.5  GLUCOSE 133* 127* 113* 119*  --     Vanessa Kick RD, LDN Clinical Nutrition Pager # [phone]

## 2017-09-20 NOTE — Progress Notes (Signed)
SLP Cancellation Note  Patient Details Name: Adam Weeks. MRN: [id number] DOB: 05/10/1927   Cancelled treatment:       Reason Eval/Treat Not Completed: Medical issues which prohibited therapy. Pt with PEA last night, now intubated. Will follow up when medically ready.   Rondel Baton, [place], CCC-SLP Speech-Language Pathologist [phone]   Arlana Lindau 09/20/2017, 12:03 PM

## 2017-09-20 NOTE — Progress Notes (Signed)
eLink Physician-Brief Progress Note Patient Name: Adam Weeks. DOB: 20-Mar-1927 MRN: [id number]   Date of Service  09/20/2017  HPI/Events of Note  Pt dropped bp on fentanyl drip with air trapping on vent at rate 26  eICU Interventions  Reduce fentanyl. Use more versed for sedation/ reduced vent rate to 20/ abgs one hour     Intervention Category Major Interventions: Delirium, psychosis, severe agitation - evaluation and management  Sandrea Hughs 09/20/2017, 8:55 PM

## 2017-09-20 NOTE — Consult Note (Signed)
Cardiology Consultation:   Patient ID: Adam Weeks.; [id number]; 07-18-1927   Admit date: 09/20/2017 Date of Consult: 09/20/2017  Primary Care Provider: Cassandria Anger, MD Primary Cardiologist: new - Dr. Oval Linsey Primary Electrophysiologist:     Patient Profile:   Adam Weeks. is a 83 y.o. male with a hx of stroke with hemorrhage, HTN, CAD and AS s/p CABG and AVR, CKD stage 3, and GERD who is being seen today s/p cardiac arrest at the request of Dr. Grandville Silos.  History of Present Illness:   [name] was brought to [facility] 09/27/2017 after he was found down in his driveway with aphasia and bilateral weakness. Code stroke was called. He was able to answer some questions and follow some commands. Head CT with possible asymmetric hyperdensity which may reflect intravascular thrombus. MRI 2015 showed area of rmote hemorrhage in right cerebellum. He was deemed not a candidate for IR or TPA. He was on no blood thinners. Aphasia resolved approximately 1 hr after presenting to [facility]. He as also found to have a hip fracture and ortho was consulted. Right hip hemiarthroplasty was performed on 09/24/2017.   On the evening of 08/30/2017, patient was found to be in Afib RVR, received 2.5 mg IV lopressor and then became bradycardic, hypotensive, confused and then lost pulses. He had a PEA arrest. He was given one round of epinephrine with response. Per the code note, it is unclear if he lost pulses or was profoundly bradycardic. He was intubated with metabolic acidosis. He regained pulses and was in sinus rhythm. He was started on norepinephrine infusion. EKG with afib RVR.   Lactic acid was found to be profoundly elevated to 6.5, now 2.6, with leukocytosis of 24.4 (22.1) with Tmax overnight 101.1.  On my interview, pt is sedated but can answer yes/no questions before falling asleep. He denies recent chest pain and palpitations, even when doing his normal chores at home. Son at bedside states his CABG was  about 10 years ago and he has not had chest pain since. Son also states that he had a bout of Afib several years ago after a bout of diverticulitis. He is not on anticoagulation at home.   Past Medical History:  Diagnosis Date  . CAD (coronary artery disease)   . Chronic kidney disease    stones  . GERD (gastroesophageal reflux disease)   . History of gout 09/28/2014   2014  . Hyperlipidemia   . Hypertension   . Osteoporosis   . Stroke [facility])    TIA    Past Surgical History:  Procedure Laterality Date  . CARDIAC VALVE REPLACEMENT  2009   pig valve -- AVR  . CORONARY ARTERY BYPASS GRAFT     2009  . HERNIA REPAIR       Home Medications:  Prior to Admission medications   Medication Sig Start Date End Date Taking? Authorizing Provider  acetaminophen (TYLENOL) 500 MG tablet Take 1,000 mg by mouth every 6 (six) hours as needed for mild pain.   Yes [provider]  allopurinol (ZYLOPRIM) 100 MG tablet Take 1 tablet (100 mg total) by mouth daily. 06/05/14  Yes Short, Noah Delaine, MD  aspirin 81 MG tablet Take 81 mg by mouth 2 (two) times daily.    Yes [provider]  atorvastatin (LIPITOR) 40 MG tablet Take 1 tablet (40 mg total) by mouth daily. 06/05/14  Yes Short, Noah Delaine, MD  carvedilol (COREG) 12.5 MG tablet Take 12.5 mg by mouth  2 (two) times daily with a meal.   Yes [provider]  Cholecalciferol 1000 UNITS tablet Take 1,000 Units by mouth daily.     Yes [provider]  docusate sodium (COLACE) 100 MG capsule Take 200 mg by mouth 2 (two) times daily.   Yes [provider]  finasteride (PROSCAR) 5 MG tablet Take 5 mg by mouth daily.   Yes [provider]  HYDROmorphone (DILAUDID) 4 MG tablet Take 0.5-1 tablets (2-4 mg total) by mouth every 6 (six) hours as needed for severe pain. 09/01/17  Yes Plotnikov, Evie Lacks, MD  lidocaine (LIDODERM) 5 % Place 1 patch onto the skin as needed (for pain). Remove & Discard patch within 12 hours  or as directed by MD    Yes [provider]  Magnesium 200 MG TABS 1 po qd Patient taking differently: Take 1 each by mouth daily.  10/23/14  Yes Plotnikov, Evie Lacks, MD  Multiple Vitamins-Minerals (OCUVITE PRESERVISION) TABS Take 2 tablets by mouth 2 (two) times daily.     Yes [provider]  omeprazole (PRILOSEC) 20 MG capsule Take 20 mg by mouth 2 (two) times daily before a meal.    Yes [provider]  predniSONE (DELTASONE) 10 MG tablet 1 po qd Patient taking differently: Take 10 mg by mouth daily with breakfast.  09/01/17  Yes Plotnikov, Evie Lacks, MD  torsemide (DEMADEX) 20 MG tablet Take 1-2 tablets (20-40 mg total) by mouth daily. Patient taking differently: Take 20 mg by mouth daily.  04/24/17  Yes Plotnikov, Evie Lacks, MD  venlafaxine XR (EFFEXOR-XR) 75 MG 24 hr capsule Take 225 mg by mouth daily with breakfast.   Yes [provider]    Inpatient Medications: Scheduled Meds: . acetaminophen  500 mg Per Tube TID  . allopurinol  100 mg Per Tube Daily  . aspirin  81 mg Per Tube BID  . atorvastatin  40 mg Per Tube Daily  . chlorhexidine gluconate (MEDLINE KIT)  15 mL Mouth Rinse BID  . Chlorhexidine Gluconate Cloth  6 each Topical Q0600  . cholecalciferol  1,000 Units Oral Daily  . docusate sodium  100 mg Oral BID  . feeding supplement (PRO-STAT SUGAR FREE 64)  30 mL Per Tube BID  . feeding supplement (VITAL HIGH PROTEIN)  1,000 mL Per Tube Q24H  . hydrocortisone sodium succinate  50 mg Intravenous Q6H  . insulin aspart  2-6 Units Subcutaneous Q4H  . mouth rinse  15 mL Mouth Rinse QID  . multivitamin  2 tablet Oral BID  . mupirocin ointment  1 application Nasal BID  . pantoprazole  40 mg Oral BID AC   Continuous Infusions: .  ceFAZolin (ANCEF) IV 2 g (09/20/17 0550)  . fentaNYL infusion INTRAVENOUS 200 mcg/hr (09/20/17 0842)  . norepinephrine (LEVOPHED) Adult infusion Stopped (09/20/17 0842)  .  sodium bicarbonate  infusion 1000 mL      PRN Meds: acetaminophen **OR** acetaminophen, docusate, HYDROcodone-acetaminophen, lidocaine, menthol-cetylpyridinium **OR** phenol, midazolam, ondansetron **OR** ondansetron (ZOFRAN) IV  Allergies:    Allergies  Allergen Reactions  . Gabapentin     jumpy    Social History:   Social History   Social History  . Marital status: Divorced    Spouse name: N/A  . Number of children: N/A  . Years of education: N/A   Occupational History  . Not on file.   Social History Main Topics  . Smoking status: Former Research scientist (life sciences)  . Smokeless tobacco: Never Used  .  Alcohol use No  . Drug use: No  . Sexual activity: Not Currently   Other Topics Concern  . Not on file   Social History Narrative  . No narrative on file    Family History:    Family History  Problem Relation Age of Onset  . Cancer Mother 48       breast ca  . Heart disease Father        CAD     ROS:  Please see the history of present illness.  ROS  All other ROS reviewed and negative.     Physical Exam/Data:   Vitals:   09/20/17 0745 09/20/17 0800 09/20/17 0815 09/20/17 0830  BP: (!) 145/70 131/61 (!) 131/52 (!) 144/116  Pulse: 98 94 97 98  Resp: (!) 29 (!) 27 (!) 25 (!) 28  Temp:      TempSrc:      SpO2: 93% 91% 97% 95%  Weight:        Intake/Output Summary (Last 24 hours) at 09/20/17 0850 Last data filed at 09/20/17 0800  Gross per 24 hour  Intake          3092.45 ml  Output              333 ml  Net          2759.45 ml   Filed Weights   09/12/2017 2000 09/22/2017 0457  Weight: 166 lb 3.6 oz (75.4 kg) 169 lb 12.1 oz (77 kg)   Body mass index is 24.01 kg/m.  General:  Well nourished, well developed, in no acute distress Neck: no JVD Vascular: No carotid bruits  Cardiac:  normal S1, S2; RRR; no murmur Lungs:  Intubated, diminished in bases Abd: soft, nontender, no hepatomegaly  Ext: trace edema Musculoskeletal:  No deformities, s/p right hip surgery Skin: warm and dry  Neuro:  Intubated and  sedated Psych:  Normal affect   EKG:  The EKG was personally reviewed and demonstrates:  Afib RVR Telemetry:  Telemetry was personally reviewed and demonstrates:  Generally sinus with some brief bouts of SVT vs Afib RVR  Relevant CV Studies:  Echocardiogram 09/20/17: pending  Echocardiogram 06/04/14: Study Conclusions - Left ventricle: The cavity size was normal. Systolic function was normal. The estimated ejection fraction was in the range of 55% to 60%. Wall motion was normal; there were no regional wall motion abnormalities. - Aortic valve: A bioprosthesis was present and functioning normally. Valve area (VTI): 1.45 cm^2. Valve area (Vmax): 1.32 cm^2. - Mitral valve: Calcified annulus. - Left atrium: The atrium was mildly to moderately dilated. - Tricuspid valve: There was moderate regurgitation. - Pulmonary arteries: PA peak pressure: 42 mm Hg (S).  Impressions: - No cardiac source of embolism was identified, but cannot be ruled out on the basis of this examination. The right ventricular systolic pressure was increased consistent with mild pulmonary hypertension.  Laboratory Data:  Chemistry Recent Labs Lab 09/07/2017 2002 09/08/2017 2007 09/17/2017 1416 09/20/17 0219  NA 140 141 141 137  K 5.0 4.8 4.6 4.5  CL 106 108 111 108  CO2 18*  --  20* 16*  GLUCOSE 133* 127* 113* 119*  BUN 42* 42* 38* 43*  CREATININE 1.67* 1.60* 1.80* 2.49*  CALCIUM 8.8*  --  7.8* 7.5*  GFRNONAA 35*  --  32* 22*  GFRAA 41*  --  37* 25*  ANIONGAP 16*  --  10 13     Recent Labs Lab 09/14/2017 2002  PROT 6.5  ALBUMIN 3.5  AST 25  ALT 19  ALKPHOS 59  BILITOT 0.8   Hematology Recent Labs Lab 09/11/2017 2002 09/04/2017 2007 09/17/2017 1426 09/20/17 0219  WBC 14.6*  --  22.1* 24.4*  RBC 3.69*  --  3.23* 3.12*  HGB 12.1* 12.2* 10.3* 9.9*  HCT 37.4* 36.0* 32.6* 31.5*  MCV 101.4*  --  100.9* 101.0*  MCH 32.8  --  31.9 31.7  MCHC 32.4  --  31.6 31.4  RDW 14.0  --  13.9 14.0   PLT 156  --  117* 100*   Cardiac EnzymesNo results for input(s): TROPONINI in the last 168 hours.  Recent Labs Lab 09/08/2017 2006  TROPIPOC 0.04    BNPNo results for input(s): BNP, PROBNP in the last 168 hours.  DDimer No results for input(s): DDIMER in the last 168 hours.  Radiology/Studies:  Ct Angio Head W Or Wo Contrast  Result Date: 09/03/2017 CLINICAL DATA:  Initial evaluation for acute aphasia. EXAM: CT ANGIOGRAPHY HEAD AND NECK TECHNIQUE: Multidetector CT imaging of the head and neck was performed using the standard protocol during bolus administration of intravenous contrast. Multiplanar CT image reconstructions and MIPs were obtained to evaluate the vascular anatomy. Carotid stenosis measurements (when applicable) are obtained utilizing NASCET criteria, using the distal internal carotid diameter as the denominator. CONTRAST:  50 cc of Isovue 370. COMPARISON:  Prior CT from earlier same day. FINDINGS: CTA NECK FINDINGS Aortic arch: Visualized aortic arch of normal caliber with with normal branch pattern. Scattered atheromatous plaque present about the arch itself as well as the origin of the great vessels without flow-limiting stenosis. Visualized subclavian artery is widely patent. Right carotid system: Right common carotid artery patent from its origin to the bifurcation. Calcified noncalcified plaque about the right bifurcation/proximal right ICA. Associated stenosis of up to 70% by NASCET criteria. Right ICA patent distally to the skullbase without stenosis. Left carotid system: Left common carotid artery tortuous proximally but widely patent to the bifurcation. Calcified plaque about the left bifurcation without flow-limiting stenosis. Left ICA patent distally to the skullbase without stenosis or occlusion. Vertebral arteries: Both vertebral arteries arise from the subclavian arteries. Vertebral artery's tortuous with scattered atheromatous irregularity without high-grade flow-limiting  stenosis. Vertebral arteries are patent to the skullbase. Skeleton: No acute osseus abnormality. No worrisome lytic or blastic osseous lesions. Advanced degenerative spondylolysis throughout the cervical spine. Grade 1 anterolisthesis of C2 on C3 and C7 on T1. Other neck: No acute soft tissue abnormality within the neck. Salivary glands demonstrate no acute abnormality. Thyroid within normal limits. No adenopathy. Upper chest: Circumferential wall thickening about the visualized upper esophagus. Visualized upper mediastinum otherwise within normal limits. Partially visualized lungs are clear. Review of the MIP images confirms the above findings CTA HEAD FINDINGS Anterior circulation: Petrous segments widely patent bilaterally. Smooth atheromatous plaque throughout the cavernous/ supraclinoid ICAs with moderate diffuse narrowing. Changes most notable at the supraclinoid right ICA. ICA termini patent bilaterally. A1 segments irregular but patent without stenosis. Patent anterior communicating artery. Multifocal atheromatous irregularity within the A2 segments bilaterally, left greater than right, without high-grade stenosis. ACAs are patent to their distal aspects. Right M1 segment somewhat irregular but patent to its distal aspect without flow-limiting stenosis. Right MCA bifurcation normal. Proximal M2 stenoses and irregularity without occlusion. MCA branches demonstrate extensive atheromatous irregularity but are perfused to their distal aspects. Left M1 segment patent without stenosis or occlusion. Proximal and 2 stenoses and irregularity without occlusion. Distal left MCA branches are extensively irregular  but patent to their distal aspects. Posterior circulation: Multifocal atheromatous plaque within the bilateral V4 segments with mild to moderate multifocal narrowing. Left vertebral artery dominant. Posterior inferior cerebral arteries are patent proximally. Basilar artery irregular but patent to its distal  aspect without high-grade stenosis. Superior cerebral arteries patent bilaterally. Right PCA supplied via the basilar and is widely patent to its distal aspect with moderate atheromatous irregularity. Fetal type origin of the left PCA supplied via a patent left posterior communicating artery. Left PCA also patent to its distal aspect without flow-limiting stenosis with moderate multifocal atheromatous irregularity. Venous sinuses: Not well evaluated due to arterial timing of the contrast bolus. Anatomic variants: Fetal type origin of the left PCA. No other significant. Delayed phase: Not performed. Review of the MIP images confirms the above findings IMPRESSION: 1. Negative CTA for emergent large vessel occlusion. 2. Atheromatous plaque at the right carotid bifurcation with associated stenosis of up to 70% by NASCET criteria. 3. Additional extensive atheromatous disease involving the medium and small vessels of the head and neck as above. No other high-grade or correctable stenosis identified. 4. Diffuse esophageal wall thickening about the visualized upper esophagus. Considerations include sequelae of reflux disease or possibly acute esophagitis. Critical Value/emergent results were called by telephone at the time of interpretation on 09/03/2017 at 8:31 pm to Dr. Wallie Char , who verbally acknowledged these results. Electronically Signed   By: Jeannine Boga M.D.   On: 09/11/2017 21:07   Dg Chest 1 View  Result Date: 09/07/2017 CLINICAL DATA:  Fall with pain. EXAM: CHEST 1 VIEW COMPARISON:  04/13/2017 FINDINGS: Lungs are adequately inflated with mild elevation of the left hemidiaphragm unchanged. No focal lobar consolidation or effusion. No pneumothorax. Mild stable cardiomegaly. Calcified plaque over the aortic arch. Remainder of the exam is unchanged. IMPRESSION: No acute cardiopulmonary disease. Stable cardiomegaly. Aortic Atherosclerosis (ICD10-I70.0). Electronically Signed   By: Marin Olp  M.D.   On: 09/17/2017 21:47   Dg Elbow Complete Right (3+view)  Result Date: 09/25/2017 CLINICAL DATA:  Fall with right elbow pain. EXAM: RIGHT ELBOW - COMPLETE 3+ VIEW COMPARISON:  None. FINDINGS: Lateral film is not optimal. No evidence of acute fracture or dislocation. No significant joint effusion. IMPRESSION: No acute findings. Electronically Signed   By: Marin Olp M.D.   On: 09/22/2017 21:49   Ct Angio Neck W Or Wo Contrast  Result Date: 09/24/2017 CLINICAL DATA:  Initial evaluation for acute aphasia. EXAM: CT ANGIOGRAPHY HEAD AND NECK TECHNIQUE: Multidetector CT imaging of the head and neck was performed using the standard protocol during bolus administration of intravenous contrast. Multiplanar CT image reconstructions and MIPs were obtained to evaluate the vascular anatomy. Carotid stenosis measurements (when applicable) are obtained utilizing NASCET criteria, using the distal internal carotid diameter as the denominator. CONTRAST:  50 cc of Isovue 370. COMPARISON:  Prior CT from earlier same day. FINDINGS: CTA NECK FINDINGS Aortic arch: Visualized aortic arch of normal caliber with with normal branch pattern. Scattered atheromatous plaque present about the arch itself as well as the origin of the great vessels without flow-limiting stenosis. Visualized subclavian artery is widely patent. Right carotid system: Right common carotid artery patent from its origin to the bifurcation. Calcified noncalcified plaque about the right bifurcation/proximal right ICA. Associated stenosis of up to 70% by NASCET criteria. Right ICA patent distally to the skullbase without stenosis. Left carotid system: Left common carotid artery tortuous proximally but widely patent to the bifurcation. Calcified plaque about the left bifurcation without  flow-limiting stenosis. Left ICA patent distally to the skullbase without stenosis or occlusion. Vertebral arteries: Both vertebral arteries arise from the subclavian  arteries. Vertebral artery's tortuous with scattered atheromatous irregularity without high-grade flow-limiting stenosis. Vertebral arteries are patent to the skullbase. Skeleton: No acute osseus abnormality. No worrisome lytic or blastic osseous lesions. Advanced degenerative spondylolysis throughout the cervical spine. Grade 1 anterolisthesis of C2 on C3 and C7 on T1. Other neck: No acute soft tissue abnormality within the neck. Salivary glands demonstrate no acute abnormality. Thyroid within normal limits. No adenopathy. Upper chest: Circumferential wall thickening about the visualized upper esophagus. Visualized upper mediastinum otherwise within normal limits. Partially visualized lungs are clear. Review of the MIP images confirms the above findings CTA HEAD FINDINGS Anterior circulation: Petrous segments widely patent bilaterally. Smooth atheromatous plaque throughout the cavernous/ supraclinoid ICAs with moderate diffuse narrowing. Changes most notable at the supraclinoid right ICA. ICA termini patent bilaterally. A1 segments irregular but patent without stenosis. Patent anterior communicating artery. Multifocal atheromatous irregularity within the A2 segments bilaterally, left greater than right, without high-grade stenosis. ACAs are patent to their distal aspects. Right M1 segment somewhat irregular but patent to its distal aspect without flow-limiting stenosis. Right MCA bifurcation normal. Proximal M2 stenoses and irregularity without occlusion. MCA branches demonstrate extensive atheromatous irregularity but are perfused to their distal aspects. Left M1 segment patent without stenosis or occlusion. Proximal and 2 stenoses and irregularity without occlusion. Distal left MCA branches are extensively irregular but patent to their distal aspects. Posterior circulation: Multifocal atheromatous plaque within the bilateral V4 segments with mild to moderate multifocal narrowing. Left vertebral artery dominant.  Posterior inferior cerebral arteries are patent proximally. Basilar artery irregular but patent to its distal aspect without high-grade stenosis. Superior cerebral arteries patent bilaterally. Right PCA supplied via the basilar and is widely patent to its distal aspect with moderate atheromatous irregularity. Fetal type origin of the left PCA supplied via a patent left posterior communicating artery. Left PCA also patent to its distal aspect without flow-limiting stenosis with moderate multifocal atheromatous irregularity. Venous sinuses: Not well evaluated due to arterial timing of the contrast bolus. Anatomic variants: Fetal type origin of the left PCA. No other significant. Delayed phase: Not performed. Review of the MIP images confirms the above findings IMPRESSION: 1. Negative CTA for emergent large vessel occlusion. 2. Atheromatous plaque at the right carotid bifurcation with associated stenosis of up to 70% by NASCET criteria. 3. Additional extensive atheromatous disease involving the medium and small vessels of the head and neck as above. No other high-grade or correctable stenosis identified. 4. Diffuse esophageal wall thickening about the visualized upper esophagus. Considerations include sequelae of reflux disease or possibly acute esophagitis. Critical Value/emergent results were called by telephone at the time of interpretation on 08/30/2017 at 8:31 pm to Dr. Wallie Char , who verbally acknowledged these results. Electronically Signed   By: Jeannine Boga M.D.   On: 09/20/2017 21:07   Dg Pelvis Portable  Result Date: 09/13/2017 CLINICAL DATA:  Femoral neck fracture. EXAM: PORTABLE PELVIS 1-2 VIEWS COMPARISON:  08/29/2017 FINDINGS: Patient now has a right hip arthroplasty. The arthroplasty appears located on this single view. Pelvic bony ring is intact. Multiple phleboliths in the pelvis. Again noted is mild joint space narrowing in the left hip joint. Lucency in the right upper leg soft  tissues compatible with recent surgery. IMPRESSION: Right hip arthroplasty without complicating features. Electronically Signed   By: Markus Daft M.D.   On: 08/31/2017 13:40  Dg Chest Port 1 View  Result Date: 09/08/2017 CLINICAL DATA:  Intubated EXAM: PORTABLE CHEST 1 VIEW COMPARISON:  09/22/2017 FINDINGS: Endotracheal tube tip has been placed, the tip is about 2.5 cm superior to carina. Esophageal tube tip overlies the left upper quadrant. Left diaphragm is elevated. Post sternotomy changes. Cardiomegaly. Small left pleural effusion and left basilar atelectasis or pneumonia. Atherosclerosis. No pneumothorax. IMPRESSION: 1. Endotracheal tube tip is about 2.5 cm superior to carina 2. Esophageal tube tip is in the left upper quadrant beneath an elevated left diaphragm 3. Cardiomegaly. Development of a small left pleural effusion and left basilar atelectasis or pneumonia Electronically Signed   By: Donavan Foil M.D.   On: 09/17/2017 22:40   Dg Humerus Right  Result Date: 09/25/2017 CLINICAL DATA:  Fall with pain. EXAM: RIGHT HUMERUS - 2+ VIEW COMPARISON:  None. FINDINGS: Diffuse osteopenia. Mild degenerate change of the glenohumeral joint and AC joint. No acute fracture or dislocation. IMPRESSION: No acute findings. Electronically Signed   By: Marin Olp M.D.   On: 09/11/2017 21:48   Mr Brain Limited Wo Contrast  Result Date: 09/13/2017 CLINICAL DATA:  Found in driveway. Aphasic. Assess for cerebellar stroke. EXAM: MRI HEAD WITHOUT CONTRAST MRA HEAD WITHOUT CONTRAST TECHNIQUE: Sagittal, axial and coronal diffusion weighted imaging of the brain ; per technologist note, patient refused further imaging. Angiographic images of the head were obtained using MRA technique without contrast. COMPARISON:  CT/CTA HEAD September 18, 2017 and MRI/MRA of the head June 04, 2014 FINDINGS: MRI HEAD FINDINGS BRAIN: No reduced diffusion to suggest acute ischemia, hypercellular tumor or typical infection. Chronic  microhemorrhage RIGHT cerebellum present on prior MRI. Ventricles and sulci are normal for patient's age. No midline shift, mass effect or definite masses though limited assessment. No large extra-axial fluid collections. VASCULAR: Not assessed. SKULL AND UPPER CERVICAL SPINE: No abnormal sellar expansion. SINUSES/ORBITS: Not assessed. OTHER: None. MRA HEAD FINDINGS- moderately motion degraded examination. ANTERIOR CIRCULATION: Normal flow related enhancement of the included cervical, petrous, cavernous and supraclinoid internal carotid arteries. Patent anterior communicating artery. Patent anterior and middle cerebral arteries, including distal segments. POSTERIOR CIRCULATION: Codominant vertebral artery's. Basilar artery is patent, with normal flow related enhancement of the main branch vessels. Patent posterior cerebral arteries. Fetal origin of LEFT posterior cerebral artery. ANATOMIC VARIANTS: None.Source images and MIP images were reviewed. IMPRESSION: MRI HEAD: 1. Limited 3 sequence MRI of the head: No acute intracranial process. MRA HEAD: 1. No emergent large vessel occlusion or flow limiting stenosis on this moderately motion degraded examination. Electronically Signed   By: Elon Alas M.D.   On: 09/02/2017 01:50   Mr Jodene Nam Head Wo Contrast  Result Date: 09/17/2017 CLINICAL DATA:  Found in driveway. Aphasic. Assess for cerebellar stroke. EXAM: MRI HEAD WITHOUT CONTRAST MRA HEAD WITHOUT CONTRAST TECHNIQUE: Sagittal, axial and coronal diffusion weighted imaging of the brain ; per technologist note, patient refused further imaging. Angiographic images of the head were obtained using MRA technique without contrast. COMPARISON:  CT/CTA HEAD September 18, 2017 and MRI/MRA of the head June 04, 2014 FINDINGS: MRI HEAD FINDINGS BRAIN: No reduced diffusion to suggest acute ischemia, hypercellular tumor or typical infection. Chronic microhemorrhage RIGHT cerebellum present on prior MRI. Ventricles and sulci  are normal for patient's age. No midline shift, mass effect or definite masses though limited assessment. No large extra-axial fluid collections. VASCULAR: Not assessed. SKULL AND UPPER CERVICAL SPINE: No abnormal sellar expansion. SINUSES/ORBITS: Not assessed. OTHER: None. MRA HEAD FINDINGS- moderately motion degraded examination.  ANTERIOR CIRCULATION: Normal flow related enhancement of the included cervical, petrous, cavernous and supraclinoid internal carotid arteries. Patent anterior communicating artery. Patent anterior and middle cerebral arteries, including distal segments. POSTERIOR CIRCULATION: Codominant vertebral artery's. Basilar artery is patent, with normal flow related enhancement of the main branch vessels. Patent posterior cerebral arteries. Fetal origin of LEFT posterior cerebral artery. ANATOMIC VARIANTS: None.Source images and MIP images were reviewed. IMPRESSION: MRI HEAD: 1. Limited 3 sequence MRI of the head: No acute intracranial process. MRA HEAD: 1. No emergent large vessel occlusion or flow limiting stenosis on this moderately motion degraded examination. Electronically Signed   By: Elon Alas M.D.   On: 09/08/2017 01:50   Dg Hip Unilat With Pelvis 2-3 Views Right  Result Date: 09/22/2017 CLINICAL DATA:  Fall with pain. EXAM: DG HIP (WITH OR WITHOUT PELVIS) 2-3V RIGHT COMPARISON:  None. FINDINGS: There is diffuse osteopenia. There mild degenerate changes of the hips. There is a displaced fracture of the right femoral neck likely subcapital in location. Superior displacement of the distal fragment. Hardware over the lumbosacral spine. Several pelvic phleboliths. IMPRESSION: Displaced right femoral neck fracture. Electronically Signed   By: Marin Olp M.D.   On: 09/07/2017 21:46   Ct Head Code Stroke Wo Contrast  Result Date: 09/12/2017 CLINICAL DATA:  Code stroke. Initial evaluation for acute aphasia. Weakness. EXAM: CT HEAD WITHOUT CONTRAST TECHNIQUE: Contiguous axial  images were obtained from the base of the skull through the vertex without intravenous contrast. COMPARISON:  Prior MRI from 06/04/2014. FINDINGS: Brain: Generalized age related cerebral atrophy with chronic small vessel ischemic disease. Patchy 13 mm hypodensity within the inferior left cerebellar hemisphere, suspicious for evolving acute ischemic infarct (series 3, image 5). No other definite acute large vessel territory infarct. No acute intracranial hemorrhage. No mass lesion or midline shift. No hydrocephalus. No extra-axial fluid collection. Vascular: Question asymmetric hyperdensity involving the right M1 segment. Prominent vascular calcifications within the carotid siphons. Skull: Scalp soft tissues and calvarium within normal limits. Sinuses/Orbits: Globes and orbital soft tissues normal. Patient status post lens extraction bilaterally. Chronic right sphenoid sinus disease. Paranasal sinuses otherwise clear. No mastoid effusion. Other: None. ASPECTS [facility] Stroke Program Early CT Score) - Ganglionic level infarction (caudate, lentiform nuclei, internal capsule, insula, M1-M3 cortex): 7 - Supraganglionic infarction (M4-M6 cortex): 3 Total score (0-10 with 10 being normal): 10 IMPRESSION: 1. Question asymmetric hyperdensity within the right M1 segment/dense M1, which may reflect intravascular thrombus. Further evaluation with CTA recommended. No definite evidence for evolving left MCA territory ischemia. No acute intracranial hemorrhage. 2. ASPECTS is 10 3. Patchy hypodensity within the inferior left cerebellar hemisphere, suspicious for evolving acute left PICA territory infarct. 4. Atrophy with chronic small vessel ischemic disease. Critical Value/emergent results were called by telephone at the time of interpretation on 09/12/2017 at 8:31 pm to Dr. Nicole Kindred , who verbally acknowledged these results. Electronically Signed   By: Jeannine Boga M.D.   On: 09/04/2017 20:31    Assessment and Plan:    1. Afib RVR s/p IV lopressor with subsequent bradycardia and confusion with loss of pulses, S/P cardiac arrest with CPR and epi x 1 with return of pulses and NSR - it is unclear if the pt lost pulses or if he was profoundly bradycardic  - 1 round of epinephrine converted him to sinus rhythm with regain of pulses - he is now intubated and lightly sedated with norepinephrine drip - he denies recent chest pain - given his current status (intubated and  sedated with acute on chronic kidney injury) we will not pursue immediate ischemic evaluation for this afib and possible PEA arrest. Echo pending. If abnormal or changed from 2015, will reevaluate for ischemic process.  Coronary CT will offer little utility in an 83 year old patient with known disease with CKD. Troponin x 1 negative. Agree with holding all AV nodal blocking agents. If recurrence of Afib, will consider other antiarrhythmics such as amiodarone.    2. Acute on CKD stage 3 - sCr on admission 1.6, now 2.49 - baseline is 1.60-1.69   3. HTN - hold home meds - hold all AV nodal blocking agents - on norepinephrine drip   4. HLD - continue lipitor    For questions or updates, please contact [place] Please consult [email or web address] for contact info under Cardiology/STEMI.   Signed, Tami Lin Mauriana Dann, PA  09/20/2017 8:50 AM

## 2017-09-20 NOTE — Progress Notes (Signed)
OT Cancellation Note  Patient Details Name: Adam Weeks. MRN: [id number] DOB: 03/05/1927   Cancelled Treatment:    Reason Eval/Treat Not Completed: Medical issues which prohibited therapy   Chart reviewed; noted events of last evening; Currently on mechanical ventilation;   Will hold OT for today, and continue to check on for appropriateness of OT eval;   Emelda Fear 09/20/2017, 7:47 AM  Sherryl Manges OTR/L [phone]

## 2017-09-20 NOTE — Progress Notes (Addendum)
..   Name: Adam Weeks. MRN: [id number] DOB: 12/31/1926    ADMISSION DATE:  09/10/2017 CONSULTATION DATE: 09/10/2017  REFERRING MD :  Janee Morn MD CHIEF COMPLAINT:    BRIEF PATIENT DESCRIPTION: 83 YR OLD MALE PRESENTED ON 09/22/2017 S/P FALL, a displaced right femoral neck fracture, POD 0 Right hip hemiarthroplasty. Noted to Afib RVR received Metoprolol 2.5mg  bradycardic S/P CARDIOPULMONARY ARREST  SIGNIFICANT EVENTS  S/p PEA arrest  POD O Right hemarthroplasty  STUDIES:  CTA Head and neck- 08/30/2017 CXR XR ELBOW/HUMERUS/LUMBAR SPINE/PELVIS MRI BRAIN 09/14/2017   HISTORY OF PRESENT ILLNESS:  83 y.o.malewith medical history significant of TIA, HTN, CAD s/p CABG, AVR s/p pig valve, CKD stage 3. Presented to [facility] after being found down in driveway. Had expressive and receptive aphasia, severe pain in R hip and to lesser extent R elbow. CT head showed suspicious area in L cerebrum for acute stroke. Patient not given TPA due to prior MRI in 2015 which showed remote hemorrhage R cerebellum, as well as acute traumatic injury. CTA negative Right carotid bifurcation w/ assoc stenosis of up to 70%. MRa was negative. Also found to have a Right femoral neck fracture. POD 0 Right hemiarthroplasty returned to floor with no immediate issues.  Later found to be in Afib RVR. Metorpolol IV 2.5mg  ordered pt becamse confused, hypotensive and lost pulses per report   SUBJECTIVE: echo being performed Remains on levo of 3 Follows commands  VITAL SIGNS: Temp:  [98.1 F (36.7 C)-101.1 F (38.4 C)] 99.7 F (37.6 C) (09/23 0744) Pulse Rate:  [88-114] 90 (09/23 0545) Resp:  [8-47] 26 (09/23 0545) BP: (90-167)/(50-85) 120/65 (09/23 0545) SpO2:  [87 %-100 %] 100 % (09/23 0545) FiO2 (%):  [50 %-100 %] 50 % (09/23 0741)  PHYSICAL EXAMINATION: General: rass 0  Neuro: FC, perrl HEENT: ett, obese neck PULM:CTA anterior CV: s1 s2 irr not tachy no m GI: soft, bs wnl no r/g Extremities: no edema    Recent  Labs Lab 09/12/2017 2002 09/23/2017 2007 09/01/2017 1416 09/20/17 0219  NA 140 141 141 137  K 5.0 4.8 4.6 4.5  CL 106 108 111 108  CO2 18*  --  20* 16*  BUN 42* 42* 38* 43*  CREATININE 1.67* 1.60* 1.80* 2.49*  GLUCOSE 133* 127* 113* 119*    Recent Labs Lab 09/25/2017 2002 09/04/2017 2007 09/11/2017 1426 09/20/17 0219  HGB 12.1* 12.2* 10.3* 9.9*  HCT 37.4* 36.0* 32.6* 31.5*  WBC 14.6*  --  22.1* 24.4*  PLT 156  --  117* 100*   Ct Angio Head W Or Wo Contrast  Result Date: 09/22/2017 CLINICAL DATA:  Initial evaluation for acute aphasia. EXAM: CT ANGIOGRAPHY HEAD AND NECK TECHNIQUE: Multidetector CT imaging of the head and neck was performed using the standard protocol during bolus administration of intravenous contrast. Multiplanar CT image reconstructions and MIPs were obtained to evaluate the vascular anatomy. Carotid stenosis measurements (when applicable) are obtained utilizing NASCET criteria, using the distal internal carotid diameter as the denominator. CONTRAST:  50 cc of Isovue 370. COMPARISON:  Prior CT from earlier same day. FINDINGS: CTA NECK FINDINGS Aortic arch: Visualized aortic arch of normal caliber with with normal branch pattern. Scattered atheromatous plaque present about the arch itself as well as the origin of the great vessels without flow-limiting stenosis. Visualized subclavian artery is widely patent. Right carotid system: Right common carotid artery patent from its origin to the bifurcation. Calcified noncalcified plaque about the right bifurcation/proximal right ICA. Associated stenosis of  up to 70% by NASCET criteria. Right ICA patent distally to the skullbase without stenosis. Left carotid system: Left common carotid artery tortuous proximally but widely patent to the bifurcation. Calcified plaque about the left bifurcation without flow-limiting stenosis. Left ICA patent distally to the skullbase without stenosis or occlusion. Vertebral arteries: Both vertebral arteries  arise from the subclavian arteries. Vertebral artery's tortuous with scattered atheromatous irregularity without high-grade flow-limiting stenosis. Vertebral arteries are patent to the skullbase. Skeleton: No acute osseus abnormality. No worrisome lytic or blastic osseous lesions. Advanced degenerative spondylolysis throughout the cervical spine. Grade 1 anterolisthesis of C2 on C3 and C7 on T1. Other neck: No acute soft tissue abnormality within the neck. Salivary glands demonstrate no acute abnormality. Thyroid within normal limits. No adenopathy. Upper chest: Circumferential wall thickening about the visualized upper esophagus. Visualized upper mediastinum otherwise within normal limits. Partially visualized lungs are clear. Review of the MIP images confirms the above findings CTA HEAD FINDINGS Anterior circulation: Petrous segments widely patent bilaterally. Smooth atheromatous plaque throughout the cavernous/ supraclinoid ICAs with moderate diffuse narrowing. Changes most notable at the supraclinoid right ICA. ICA termini patent bilaterally. A1 segments irregular but patent without stenosis. Patent anterior communicating artery. Multifocal atheromatous irregularity within the A2 segments bilaterally, left greater than right, without high-grade stenosis. ACAs are patent to their distal aspects. Right M1 segment somewhat irregular but patent to its distal aspect without flow-limiting stenosis. Right MCA bifurcation normal. Proximal M2 stenoses and irregularity without occlusion. MCA branches demonstrate extensive atheromatous irregularity but are perfused to their distal aspects. Left M1 segment patent without stenosis or occlusion. Proximal and 2 stenoses and irregularity without occlusion. Distal left MCA branches are extensively irregular but patent to their distal aspects. Posterior circulation: Multifocal atheromatous plaque within the bilateral V4 segments with mild to moderate multifocal narrowing. Left  vertebral artery dominant. Posterior inferior cerebral arteries are patent proximally. Basilar artery irregular but patent to its distal aspect without high-grade stenosis. Superior cerebral arteries patent bilaterally. Right PCA supplied via the basilar and is widely patent to its distal aspect with moderate atheromatous irregularity. Fetal type origin of the left PCA supplied via a patent left posterior communicating artery. Left PCA also patent to its distal aspect without flow-limiting stenosis with moderate multifocal atheromatous irregularity. Venous sinuses: Not well evaluated due to arterial timing of the contrast bolus. Anatomic variants: Fetal type origin of the left PCA. No other significant. Delayed phase: Not performed. Review of the MIP images confirms the above findings IMPRESSION: 1. Negative CTA for emergent large vessel occlusion. 2. Atheromatous plaque at the right carotid bifurcation with associated stenosis of up to 70% by NASCET criteria. 3. Additional extensive atheromatous disease involving the medium and small vessels of the head and neck as above. No other high-grade or correctable stenosis identified. 4. Diffuse esophageal wall thickening about the visualized upper esophagus. Considerations include sequelae of reflux disease or possibly acute esophagitis. Critical Value/emergent results were called by telephone at the time of interpretation on 09/25/2017 at 8:31 pm to Dr. Noel Christmas , who verbally acknowledged these results. Electronically Signed   By: Rise Mu M.D.   On: 09/08/2017 21:07   Dg Chest 1 View  Result Date: 09/08/2017 CLINICAL DATA:  Fall with pain. EXAM: CHEST 1 VIEW COMPARISON:  04/13/2017 FINDINGS: Lungs are adequately inflated with mild elevation of the left hemidiaphragm unchanged. No focal lobar consolidation or effusion. No pneumothorax. Mild stable cardiomegaly. Calcified plaque over the aortic arch. Remainder of the exam is unchanged.  IMPRESSION:  No acute cardiopulmonary disease. Stable cardiomegaly. Aortic Atherosclerosis (ICD10-I70.0). Electronically Signed   By: Elberta Fortis M.D.   On: 08/30/2017 21:47   Dg Elbow Complete Right (3+view)  Result Date: 09/17/2017 CLINICAL DATA:  Fall with right elbow pain. EXAM: RIGHT ELBOW - COMPLETE 3+ VIEW COMPARISON:  None. FINDINGS: Lateral film is not optimal. No evidence of acute fracture or dislocation. No significant joint effusion. IMPRESSION: No acute findings. Electronically Signed   By: Elberta Fortis M.D.   On: 09/07/2017 21:49   Ct Angio Neck W Or Wo Contrast  Result Date: 09/08/2017 CLINICAL DATA:  Initial evaluation for acute aphasia. EXAM: CT ANGIOGRAPHY HEAD AND NECK TECHNIQUE: Multidetector CT imaging of the head and neck was performed using the standard protocol during bolus administration of intravenous contrast. Multiplanar CT image reconstructions and MIPs were obtained to evaluate the vascular anatomy. Carotid stenosis measurements (when applicable) are obtained utilizing NASCET criteria, using the distal internal carotid diameter as the denominator. CONTRAST:  50 cc of Isovue 370. COMPARISON:  Prior CT from earlier same day. FINDINGS: CTA NECK FINDINGS Aortic arch: Visualized aortic arch of normal caliber with with normal branch pattern. Scattered atheromatous plaque present about the arch itself as well as the origin of the great vessels without flow-limiting stenosis. Visualized subclavian artery is widely patent. Right carotid system: Right common carotid artery patent from its origin to the bifurcation. Calcified noncalcified plaque about the right bifurcation/proximal right ICA. Associated stenosis of up to 70% by NASCET criteria. Right ICA patent distally to the skullbase without stenosis. Left carotid system: Left common carotid artery tortuous proximally but widely patent to the bifurcation. Calcified plaque about the left bifurcation without flow-limiting stenosis. Left ICA patent  distally to the skullbase without stenosis or occlusion. Vertebral arteries: Both vertebral arteries arise from the subclavian arteries. Vertebral artery's tortuous with scattered atheromatous irregularity without high-grade flow-limiting stenosis. Vertebral arteries are patent to the skullbase. Skeleton: No acute osseus abnormality. No worrisome lytic or blastic osseous lesions. Advanced degenerative spondylolysis throughout the cervical spine. Grade 1 anterolisthesis of C2 on C3 and C7 on T1. Other neck: No acute soft tissue abnormality within the neck. Salivary glands demonstrate no acute abnormality. Thyroid within normal limits. No adenopathy. Upper chest: Circumferential wall thickening about the visualized upper esophagus. Visualized upper mediastinum otherwise within normal limits. Partially visualized lungs are clear. Review of the MIP images confirms the above findings CTA HEAD FINDINGS Anterior circulation: Petrous segments widely patent bilaterally. Smooth atheromatous plaque throughout the cavernous/ supraclinoid ICAs with moderate diffuse narrowing. Changes most notable at the supraclinoid right ICA. ICA termini patent bilaterally. A1 segments irregular but patent without stenosis. Patent anterior communicating artery. Multifocal atheromatous irregularity within the A2 segments bilaterally, left greater than right, without high-grade stenosis. ACAs are patent to their distal aspects. Right M1 segment somewhat irregular but patent to its distal aspect without flow-limiting stenosis. Right MCA bifurcation normal. Proximal M2 stenoses and irregularity without occlusion. MCA branches demonstrate extensive atheromatous irregularity but are perfused to their distal aspects. Left M1 segment patent without stenosis or occlusion. Proximal and 2 stenoses and irregularity without occlusion. Distal left MCA branches are extensively irregular but patent to their distal aspects. Posterior circulation: Multifocal  atheromatous plaque within the bilateral V4 segments with mild to moderate multifocal narrowing. Left vertebral artery dominant. Posterior inferior cerebral arteries are patent proximally. Basilar artery irregular but patent to its distal aspect without high-grade stenosis. Superior cerebral arteries patent bilaterally. Right PCA supplied via the basilar  and is widely patent to its distal aspect with moderate atheromatous irregularity. Fetal type origin of the left PCA supplied via a patent left posterior communicating artery. Left PCA also patent to its distal aspect without flow-limiting stenosis with moderate multifocal atheromatous irregularity. Venous sinuses: Not well evaluated due to arterial timing of the contrast bolus. Anatomic variants: Fetal type origin of the left PCA. No other significant. Delayed phase: Not performed. Review of the MIP images confirms the above findings IMPRESSION: 1. Negative CTA for emergent large vessel occlusion. 2. Atheromatous plaque at the right carotid bifurcation with associated stenosis of up to 70% by NASCET criteria. 3. Additional extensive atheromatous disease involving the medium and small vessels of the head and neck as above. No other high-grade or correctable stenosis identified. 4. Diffuse esophageal wall thickening about the visualized upper esophagus. Considerations include sequelae of reflux disease or possibly acute esophagitis. Critical Value/emergent results were called by telephone at the time of interpretation on 08/29/2017 at 8:31 pm to Dr. Noel Christmas , who verbally acknowledged these results. Electronically Signed   By: Rise Mu M.D.   On: 09/12/2017 21:07   Dg Pelvis Portable  Result Date: 09/03/2017 CLINICAL DATA:  Femoral neck fracture. EXAM: PORTABLE PELVIS 1-2 VIEWS COMPARISON:  09/15/2017 FINDINGS: Patient now has a right hip arthroplasty. The arthroplasty appears located on this single view. Pelvic bony ring is intact. Multiple  phleboliths in the pelvis. Again noted is mild joint space narrowing in the left hip joint. Lucency in the right upper leg soft tissues compatible with recent surgery. IMPRESSION: Right hip arthroplasty without complicating features. Electronically Signed   By: Richarda Overlie M.D.   On: 09/25/2017 13:40   Dg Chest Port 1 View  Result Date: 09/17/2017 CLINICAL DATA:  Intubated EXAM: PORTABLE CHEST 1 VIEW COMPARISON:  08/29/2017 FINDINGS: Endotracheal tube tip has been placed, the tip is about 2.5 cm superior to carina. Esophageal tube tip overlies the left upper quadrant. Left diaphragm is elevated. Post sternotomy changes. Cardiomegaly. Small left pleural effusion and left basilar atelectasis or pneumonia. Atherosclerosis. No pneumothorax. IMPRESSION: 1. Endotracheal tube tip is about 2.5 cm superior to carina 2. Esophageal tube tip is in the left upper quadrant beneath an elevated left diaphragm 3. Cardiomegaly. Development of a small left pleural effusion and left basilar atelectasis or pneumonia Electronically Signed   By: Jasmine Pang M.D.   On: 09/20/2017 22:40   Dg Humerus Right  Result Date: 08/31/2017 CLINICAL DATA:  Fall with pain. EXAM: RIGHT HUMERUS - 2+ VIEW COMPARISON:  None. FINDINGS: Diffuse osteopenia. Mild degenerate change of the glenohumeral joint and AC joint. No acute fracture or dislocation. IMPRESSION: No acute findings. Electronically Signed   By: Elberta Fortis M.D.   On: 09/03/2017 21:48   Mr Brain Limited Wo Contrast  Result Date: 09/15/2017 CLINICAL DATA:  Found in driveway. Aphasic. Assess for cerebellar stroke. EXAM: MRI HEAD WITHOUT CONTRAST MRA HEAD WITHOUT CONTRAST TECHNIQUE: Sagittal, axial and coronal diffusion weighted imaging of the brain ; per technologist note, patient refused further imaging. Angiographic images of the head were obtained using MRA technique without contrast. COMPARISON:  CT/CTA HEAD September 18, 2017 and MRI/MRA of the head June 04, 2014 FINDINGS: MRI  HEAD FINDINGS BRAIN: No reduced diffusion to suggest acute ischemia, hypercellular tumor or typical infection. Chronic microhemorrhage RIGHT cerebellum present on prior MRI. Ventricles and sulci are normal for patient's age. No midline shift, mass effect or definite masses though limited assessment. No large extra-axial fluid collections.  VASCULAR: Not assessed. SKULL AND UPPER CERVICAL SPINE: No abnormal sellar expansion. SINUSES/ORBITS: Not assessed. OTHER: None. MRA HEAD FINDINGS- moderately motion degraded examination. ANTERIOR CIRCULATION: Normal flow related enhancement of the included cervical, petrous, cavernous and supraclinoid internal carotid arteries. Patent anterior communicating artery. Patent anterior and middle cerebral arteries, including distal segments. POSTERIOR CIRCULATION: Codominant vertebral artery's. Basilar artery is patent, with normal flow related enhancement of the main branch vessels. Patent posterior cerebral arteries. Fetal origin of LEFT posterior cerebral artery. ANATOMIC VARIANTS: None.Source images and MIP images were reviewed. IMPRESSION: MRI HEAD: 1. Limited 3 sequence MRI of the head: No acute intracranial process. MRA HEAD: 1. No emergent large vessel occlusion or flow limiting stenosis on this moderately motion degraded examination. Electronically Signed   By: Awilda Metro M.D.   On: 09/04/2017 01:50   Mr Maxine Glenn Head Wo Contrast  Result Date: 09/08/2017 CLINICAL DATA:  Found in driveway. Aphasic. Assess for cerebellar stroke. EXAM: MRI HEAD WITHOUT CONTRAST MRA HEAD WITHOUT CONTRAST TECHNIQUE: Sagittal, axial and coronal diffusion weighted imaging of the brain ; per technologist note, patient refused further imaging. Angiographic images of the head were obtained using MRA technique without contrast. COMPARISON:  CT/CTA HEAD 10-09-2017 and MRI/MRA of the head June 04, 2014 FINDINGS: MRI HEAD FINDINGS BRAIN: No reduced diffusion to suggest acute ischemia,  hypercellular tumor or typical infection. Chronic microhemorrhage RIGHT cerebellum present on prior MRI. Ventricles and sulci are normal for patient's age. No midline shift, mass effect or definite masses though limited assessment. No large extra-axial fluid collections. VASCULAR: Not assessed. SKULL AND UPPER CERVICAL SPINE: No abnormal sellar expansion. SINUSES/ORBITS: Not assessed. OTHER: None. MRA HEAD FINDINGS- moderately motion degraded examination. ANTERIOR CIRCULATION: Normal flow related enhancement of the included cervical, petrous, cavernous and supraclinoid internal carotid arteries. Patent anterior communicating artery. Patent anterior and middle cerebral arteries, including distal segments. POSTERIOR CIRCULATION: Codominant vertebral artery's. Basilar artery is patent, with normal flow related enhancement of the main branch vessels. Patent posterior cerebral arteries. Fetal origin of LEFT posterior cerebral artery. ANATOMIC VARIANTS: None.Source images and MIP images were reviewed. IMPRESSION: MRI HEAD: 1. Limited 3 sequence MRI of the head: No acute intracranial process. MRA HEAD: 1. No emergent large vessel occlusion or flow limiting stenosis on this moderately motion degraded examination. Electronically Signed   By: Awilda Metro M.D.   On: 09/06/2017 01:50   Dg Hip Unilat With Pelvis 2-3 Views Right  Result Date: 10/09/2017 CLINICAL DATA:  Fall with pain. EXAM: DG HIP (WITH OR WITHOUT PELVIS) 2-3V RIGHT COMPARISON:  None. FINDINGS: There is diffuse osteopenia. There mild degenerate changes of the hips. There is a displaced fracture of the right femoral neck likely subcapital in location. Superior displacement of the distal fragment. Hardware over the lumbosacral spine. Several pelvic phleboliths. IMPRESSION: Displaced right femoral neck fracture. Electronically Signed   By: Elberta Fortis M.D.   On: October 09, 2017 21:46   Ct Head Code Stroke Wo Contrast  Result Date: 2017-10-09 CLINICAL  DATA:  Code stroke. Initial evaluation for acute aphasia. Weakness. EXAM: CT HEAD WITHOUT CONTRAST TECHNIQUE: Contiguous axial images were obtained from the base of the skull through the vertex without intravenous contrast. COMPARISON:  Prior MRI from 06/04/2014. FINDINGS: Brain: Generalized age related cerebral atrophy with chronic small vessel ischemic disease. Patchy 13 mm hypodensity within the inferior left cerebellar hemisphere, suspicious for evolving acute ischemic infarct (series 3, image 5). No other definite acute large vessel territory infarct. No acute intracranial hemorrhage. No mass lesion  or midline shift. No hydrocephalus. No extra-axial fluid collection. Vascular: Question asymmetric hyperdensity involving the right M1 segment. Prominent vascular calcifications within the carotid siphons. Skull: Scalp soft tissues and calvarium within normal limits. Sinuses/Orbits: Globes and orbital soft tissues normal. Patient status post lens extraction bilaterally. Chronic right sphenoid sinus disease. Paranasal sinuses otherwise clear. No mastoid effusion. Other: None. ASPECTS [facility] Stroke Program Early CT Score) - Ganglionic level infarction (caudate, lentiform nuclei, internal capsule, insula, M1-M3 cortex): 7 - Supraganglionic infarction (M4-M6 cortex): 3 Total score (0-10 with 10 being normal): 10 IMPRESSION: 1. Question asymmetric hyperdensity within the right M1 segment/dense M1, which may reflect intravascular thrombus. Further evaluation with CTA recommended. No definite evidence for evolving left MCA territory ischemia. No acute intracranial hemorrhage. 2. ASPECTS is 10 3. Patchy hypodensity within the inferior left cerebellar hemisphere, suspicious for evolving acute left PICA territory infarct. 4. Atrophy with chronic small vessel ischemic disease. Critical Value/emergent results were called by telephone at the time of interpretation on 09/04/2017 at 8:31 pm to Dr. Roseanne Reno , who verbally  acknowledged these results. Electronically Signed   By: Rise Mu M.D.   On: 09/27/2017 20:31     ASSESSMENT / PLAN:  NEURO: H/o Falls Prior h/o CVA Chronic microhemorrhage of the Right Cerebellum no acute intracranial pathology Neurology following -fent drip with wua Continues on Aspirin 81 mg   CARDIAC: S/p cardiac arrest Most likely due to beta blockade, rule out active ischemia -echo -asa On wean with st changes per rt and hypoxia  = cards assessment -get stat trop, ecg Dc weaning -avoiding heparin for now with hip, but will discuss with ortho Lactic better but seems WAY out of proportion high for 1 min pea arrest  H/o HTN HLD, CAD Bioprosthetic Aortic valve Currently on Vasopressors Levophed ggt to map goal  MAP goal >36mmHg cortisol  Afib RVR prior to code Tele No BB Rate is controlled on own now  PULMONARY: Airway protection  Cardiopulmonary arrest pulm edema on pcxr -repeat pcxr Wean failed - hypoxia with st changes - see [facility] Lasix when bale, not able now abg reviewed, keep same MV  ID: Follow fever curve  Endocrine: H/o IDDM BG goal 140-180mg /dl On ICU Glycemic protocol -presume AI -was on pred , in shock = stress roids start  GI: OGT, feed if not invasive procedure to start ppi ensure   Heme: Per Ortho note ok for eliquis on POD 1 - will call orhto and discuss use heparin IV now  RENAL AG metabolic Acidosis  Acute Kidney Injury NONAG acidosis Lactic post arrest -bicarb for non AG in setting of ARF -hold lasix Chem in am    I, Dr Newell Coral have personally reviewed patient's available data, including medical history, events of note, physical examination and test results as part of my evaluation. I have discussed with other care providers such as pharmacist, RN and RRT.  In addition,  An Advanced Directive Dated 25th July 2014 was provided by family including POA/Healthcare proxy. Authorizes the withholding or withdrawal  of life saving measures in the event of incurable or irreversible conditions , advanced dementia, and inability to regain consciousness. The patient is critically ill with multiple organ systems failure and requires high complexity decision making for assessment and support, frequent evaluation and titration of therapies, application of advanced monitoring technologies and extensive interpretation of multiple databases.  Critical Care Time devoted to patient care services described in this note is 55 Minutes. This time reflects time of care  of this signee Dr Newell Coral. This critical care time does not reflect procedure time, or teaching time or supervisory time but could involve care discussion time    Ccm time 35 min   Mcarthur Rossetti. Tyson Alias, MD, FACP Pgr: [phone] [facility]   D/w ortho, neuro - okay to use heparin if required for cardiac concerns

## 2017-09-20 NOTE — Progress Notes (Signed)
PT Cancellation Note  Patient Details Name: Adam Weeks. MRN: [id number] DOB: [birth year]   Cancelled Treatment:    Reason Eval/Treat Not Completed: Medical issues which prohibited therapy   Chart reviewed; noted events of last evening; Currently on mechanical ventilation;   Will hold PT for today, and continue to check on for appropriateness of PT eval;   Thank you,  Van Clines, PT  Acute Rehabilitation Services Pager [phone] Office [phone]    Levi Aland 09/20/2017, 7:37 AM

## 2017-09-20 NOTE — Progress Notes (Signed)
eLink Physician-Brief Progress Note Patient Name: Adam Weeks. DOB: 16-Oct-1927 MRN: [id number]   Date of Service  09/20/2017  HPI/Events of Note  Decreased bp/ heart rate only in 60s on amiodarone drip  Intake/Output Summary (Last 24 hours) at 09/20/17 2026 Last data filed at 09/20/17 1900  Gross per 24 hour  Intake          2653.12 ml  Output              103 ml  Net          2550.12 ml     eICU Interventions  Fluid bolus/ stop amiodarone     Intervention Category Major Interventions: Hypotension - evaluation and management  Sandrea Hughs 09/20/2017, 8:25 PM

## 2017-09-20 NOTE — Progress Notes (Signed)
*  PRELIMINARY RESULTS* Vascular Ultrasound Carotid Duplex (Doppler) has been completed.  Preliminary findings: Bilateral 1-39% ICA stenosis, although right bifurcation demonstrates moderate calcific plaque formation.   Farrel Demark, RDMS, RVT  09/20/2017, 8:49 AM

## 2017-09-20 NOTE — Progress Notes (Signed)
Orthopedic Trauma Service Progress Note   Patient ID: Adam Weeks. MRN: [id number] DOB/AGE: 1927-09-29 83 y.o.  Subjective:  Notes reviewed Events overnight noted Pt in ICU    Review of Systems  Unable to perform ROS: Intubated    Objective:   VITALS:   Vitals:   09/20/17 0800 09/20/17 0815 09/20/17 0830 09/20/17 0845  BP: 131/61 (!) 131/52 (!) 144/116 140/69  Pulse: 94 97 98 99  Resp: (!) 27 (!) 25 (!) 28 (!) 24  Temp:      TempSrc:      SpO2: 91% 97% 95% 95%  Weight:        Estimated body mass index is 24.01 kg/m as calculated from the following:   Height as of 09/01/17: 5' 10.5" (1.791 m).   Weight as of this encounter: 77 kg (169 lb 12.1 oz).   Intake/Output      09/22 0701 - 09/23 0700 09/23 0701 - 09/24 0700   I.V. (mL/kg) 1716.9 (22.3) 1375.6 (17.9)   Total Intake(mL/kg) 1716.9 (22.3) 1375.6 (17.9)   Urine (mL/kg/hr) 100 (0.1) 33 (0.2)   Blood 200    Total Output 300 33   Net +1416.9 +1342.6          LABS  Results for orders placed or performed during the hospital encounter of 09/05/2017 (from the past 24 hour(s))  Basic metabolic panel     Status: Abnormal   Collection Time: 09/09/2017  2:16 PM  Result Value Ref Range   Sodium 141 135 - 145 mmol/L   Potassium 4.6 3.5 - 5.1 mmol/L   Chloride 111 101 - 111 mmol/L   CO2 20 (L) 22 - 32 mmol/L   Glucose, Bld 113 (H) 65 - 99 mg/dL   BUN 38 (H) 6 - 20 mg/dL   Creatinine, Ser 4.09 (H) 0.61 - 1.24 mg/dL   Calcium 7.8 (L) 8.9 - 10.3 mg/dL   GFR calc non Af Amer 32 (L) >60 mL/min   GFR calc Af Amer 37 (L) >60 mL/min   Anion gap 10 5 - 15  Magnesium     Status: Abnormal   Collection Time: 09/23/2017  2:16 PM  Result Value Ref Range   Magnesium 1.4 (L) 1.7 - 2.4 mg/dL  CBC     Status: Abnormal   Collection Time: 09/07/2017  2:26 PM  Result Value Ref Range   WBC 22.1 (H) 4.0 - 10.5 K/uL   RBC 3.23 (L) 4.22 - 5.81 MIL/uL   Hemoglobin 10.3 (L) 13.0 - 17.0 g/dL   HCT 81.1 (L) 91.4 -  52.0 %   MCV 100.9 (H) 78.0 - 100.0 fL   MCH 31.9 26.0 - 34.0 pg   MCHC 31.6 30.0 - 36.0 g/dL   RDW 78.2 95.6 - 21.3 %   Platelets 117 (L) 150 - 400 K/uL  Blood gas, arterial     Status: Abnormal   Collection Time: 09/10/2017 10:22 PM  Result Value Ref Range   FIO2 100.00    Delivery systems VENTILATOR    Mode PRESSURE REGULATED VOLUME CONTROL    VT 590 mL   LHR 20 resp/min   Peep/cpap 5.0 cm H20   pH, Arterial 7.133 (LL) 7.350 - 7.450   pCO2 arterial 40.5 32.0 - 48.0 mmHg   pO2, Arterial 105 83.0 - 108.0 mmHg   Bicarbonate 13.0 (L) 20.0 - 28.0 mmol/L   Acid-base deficit 14.4 (H) 0.0 - 2.0 mmol/L   O2 Saturation 95.1 %   Patient temperature  98.6    Collection site LEFT RADIAL    Drawn by [phone]    Sample type ARTERIAL DRAW    Allens test (pass/fail) PASS PASS  Lactic acid, plasma     Status: Abnormal   Collection Time: 09/07/2017 10:49 PM  Result Value Ref Range   Lactic Acid, Venous 6.5 (HH) 0.5 - 1.9 mmol/L  MRSA PCR Screening     Status: Abnormal   Collection Time: 09/20/17 12:10 AM  Result Value Ref Range   MRSA by PCR POSITIVE (A) NEGATIVE  Glucose, capillary     Status: Abnormal   Collection Time: 09/20/17 12:38 AM  Result Value Ref Range   Glucose-Capillary 116 (H) 65 - 99 mg/dL   Comment 1 Notify RN   CBC     Status: Abnormal   Collection Time: 09/20/17  2:19 AM  Result Value Ref Range   WBC 24.4 (H) 4.0 - 10.5 K/uL   RBC 3.12 (L) 4.22 - 5.81 MIL/uL   Hemoglobin 9.9 (L) 13.0 - 17.0 g/dL   HCT 09.8 (L) 11.9 - 14.7 %   MCV 101.0 (H) 78.0 - 100.0 fL   MCH 31.7 26.0 - 34.0 pg   MCHC 31.4 30.0 - 36.0 g/dL   RDW 82.9 56.2 - 13.0 %   Platelets 100 (L) 150 - 400 K/uL  Basic metabolic panel     Status: Abnormal   Collection Time: 09/20/17  2:19 AM  Result Value Ref Range   Sodium 137 135 - 145 mmol/L   Potassium 4.5 3.5 - 5.1 mmol/L   Chloride 108 101 - 111 mmol/L   CO2 16 (L) 22 - 32 mmol/L   Glucose, Bld 119 (H) 65 - 99 mg/dL   BUN 43 (H) 6 - 20 mg/dL    Creatinine, Ser 8.65 (H) 0.61 - 1.24 mg/dL   Calcium 7.5 (L) 8.9 - 10.3 mg/dL   GFR calc non Af Amer 22 (L) >60 mL/min   GFR calc Af Amer 25 (L) >60 mL/min   Anion gap 13 5 - 15  Magnesium     Status: Abnormal   Collection Time: 09/20/17  2:19 AM  Result Value Ref Range   Magnesium 2.9 (H) 1.7 - 2.4 mg/dL  Lactic acid, plasma     Status: Abnormal   Collection Time: 09/20/17  2:19 AM  Result Value Ref Range   Lactic Acid, Venous 2.6 (HH) 0.5 - 1.9 mmol/L  Glucose, capillary     Status: Abnormal   Collection Time: 09/20/17  4:40 AM  Result Value Ref Range   Glucose-Capillary 115 (H) 65 - 99 mg/dL   Comment 1 Notify RN   Blood gas, arterial     Status: Abnormal   Collection Time: 09/20/17  6:40 AM  Result Value Ref Range   FIO2 80.00    Delivery systems VENTILATOR    Mode PRESSURE REGULATED VOLUME CONTROL    VT 590 mL   LHR 26 resp/min   Peep/cpap 5.0 cm H20   pH, Arterial 7.406 7.350 - 7.450   pCO2 arterial 26.1 (L) 32.0 - 48.0 mmHg   pO2, Arterial 142 (H) 83.0 - 108.0 mmHg   Bicarbonate 16.1 (L) 20.0 - 28.0 mmol/L   Acid-base deficit 7.7 (H) 0.0 - 2.0 mmol/L   O2 Saturation 98.7 %   Patient temperature 98.6    Collection site LEFT RADIAL    Drawn by [phone]    Sample type ARTERIAL DRAW    Allens test (pass/fail) PASS PASS  Glucose,  capillary     Status: Abnormal   Collection Time: 09/20/17  7:40 AM  Result Value Ref Range   Glucose-Capillary 106 (H) 65 - 99 mg/dL   Comment 1 Capillary Specimen    Comment 2 Notify RN      PHYSICAL EXAM:   Gen: intubated  Ext:       Right Lower Extremity   Dressing R hip c/d/i  Ext cool but + DP and PT pulses noted  Ecchymosis noted throughout R leg   Mild swelling distally   Unable to assess motor/sensory functions this am   Assessment/Plan: 1 Day Post-Op   Principal Problem:   Closed right hip fracture, initial encounter (HCC) Active Problems:   TIA (transient ischemic attack)   Current chronic use of systemic steroids    HTN (hypertension)   Syncope and collapse   Anti-infectives    Start     Dose/Rate Route Frequency Ordered Stop   09/12/2017 1500  ceFAZolin (ANCEF) IVPB 2g/100 mL premix     2 g 200 mL/hr over 30 Minutes Intravenous Every 8 hours 08/29/2017 1229 09/20/17 1359   09/02/2017 1153  vancomycin (VANCOCIN) powder  Status:  Discontinued       As needed 09/23/2017 1154 09/26/2017 1214    .  POD/HD#: 1  83 y/o male with complex medical history s/p fall with R femoral neck fracture   - fall, found down in driveway   -R femoral neck fracture s/p R hip hemiarthroplasty   WBAT when able  Trochanteric hip precautions (no active hip abduction), no other restrictions   Dressing change on Tuesday   Ice prn   PT/OT once extubated    - Pain management:  Per primary services   Would recommend tylenol as primary and low dose opioid for breakthrough pain   - ABL anemia/Hemodynamics  CBC currently stable   Monitor CBC    - Medical issues   Per primary services    - DVT/PE prophylaxis:  Heparin   Ok to transition back to eliquis when appropriate per other services  No contra-indications for anticoagulation from ortho standpoint   - ID:   periop abx to be completed today    - FEN/GI prophylaxis/Foley/Lines:  Currently NPO as pt intubated    - Dispo:  Continue with ICU care  Ortho will follow along  Therapies once extubated--then we can determine what the next venue will be      Mearl Latin, PA-C [facility] [phone] (P) [phone] (O) 09/20/2017, 9:50 AM

## 2017-09-20 NOTE — Progress Notes (Signed)
ANTICOAGULATION CONSULT NOTE - Initial Consult  Pharmacy Consult for heparin Indication: atrial fibrillation  Allergies  Allergen Reactions  . Gabapentin     jumpy    Patient Measurements: Weight: 169 lb 12.1 oz (77 kg) Heparin Dosing Weight: 77 kg  Vital Signs: Temp: 99 F (37.2 C) (09/23 1139) Temp Source: Oral (09/23 1139) BP: 98/55 (09/23 1100) Pulse Rate: 85 (09/23 1100)  Labs:  Recent Labs  September 27, 2017 2002 Sep 27, 2017 2007 09/15/2017 1416 09/21/2017 1426 09/20/17 0219 09/20/17 0908  HGB 12.1* 12.2*  --  10.3* 9.9*  --   HCT 37.4* 36.0*  --  32.6* 31.5*  --   PLT 156  --   --  117* 100*  --   APTT 35  --   --   --   --   --   LABPROT 14.1  --   --   --   --   --   INR 1.10  --   --   --   --   --   CREATININE 1.67* 1.60* 1.80*  --  2.49*  --   CKTOTAL 45*  --   --   --   --   --   TROPONINI  --   --   --   --   --  1.88*    Estimated Creatinine Clearance: 21.5 mL/min (A) (by C-G formula based on SCr of 2.49 mg/dL (H)).   Medical History: Past Medical History:  Diagnosis Date  . CAD (coronary artery disease)   . Chronic kidney disease    stones  . GERD (gastroesophageal reflux disease)   . History of gout 09/28/2014   2014  . Hyperlipidemia   . Hypertension   . Osteoporosis   . Stroke [facility])    TIA     Assessment: 83 yo male admitted with after falling, today is POD #1 s/p R hip hemiarthroplasty. Was aphasic upon admission, was thought to have an ischemic stroke, also new onset AFib found. Planning to start heparin. Pt has been cleared by ortho for anticoagulation. hgb down to 9.9, plts down 100.   Goal of Therapy:  Heparin level 0.3-0.7 units/ml Monitor platelets by anticoagulation protocol: Yes    Plan:  -Heparin 1100 units/hr -Hold initial bolus given s/p surgery + thrombocytopenia -Daily HL, CBC -Check level this evening   Baldemar Friday 09/20/2017,11:51 AM

## 2017-09-20 NOTE — Progress Notes (Signed)
ANTICOAGULATION CONSULT NOTE   Pharmacy Consult for heparin Indication: atrial fibrillation  Allergies  Allergen Reactions  . Gabapentin     jumpy    Patient Measurements: Weight: 169 lb 12.1 oz (77 kg) Heparin Dosing Weight: 77 kg  Vital Signs: Temp: 97.9 F (36.6 C) (09/23 1941) Temp Source: Oral (09/23 1941) BP: 70/42 (09/23 1900) Pulse Rate: 73 (09/23 1900)  Labs:  Recent Labs  09/04/2017 2002 09/15/2017 2007 03-Oct-2017 1416 10-03-17 1426 09/20/17 0219 09/20/17 0908 09/20/17 2025  HGB 12.1* 12.2*  --  10.3* 9.9*  --   --   HCT 37.4* 36.0*  --  32.6* 31.5*  --   --   PLT 156  --   --  117* 100*  --   --   APTT 35  --   --   --   --   --   --   LABPROT 14.1  --   --   --   --   --   --   INR 1.10  --   --   --   --   --   --   HEPARINUNFRC  --   --   --   --   --   --  0.51  CREATININE 1.67* 1.60* 1.80*  --  2.49*  --   --   CKTOTAL 45*  --   --   --   --   --   --   TROPONINI  --   --   --   --   --  1.88*  --     Estimated Creatinine Clearance: 21.5 mL/min (A) (by C-G formula based on SCr of 2.49 mg/dL (H)).   Medical History: Past Medical History:  Diagnosis Date  . CAD (coronary artery disease)   . Chronic kidney disease    stones  . GERD (gastroesophageal reflux disease)   . History of gout 09/28/2014   2014  . Hyperlipidemia   . Hypertension   . Osteoporosis   . Stroke [facility])    TIA     Assessment: 83 yo male admitted with after falling, today is POD #1 s/p R hip hemiarthroplasty. Was aphasic upon admission, was thought to have an ischemic stroke, also new onset AFib found. Planning to start heparin. Pt has been cleared by ortho for anticoagulation. hgb down to 9.9, plts down 100.  Initial heparin level is therapeutic at 0.51. No reported bleeding.   Goal of Therapy:  Heparin level 0.3-0.7 units/ml Monitor platelets by anticoagulation protocol: Yes   Plan:  -Continue Heparin infusion at 1100 units/hr -Confirmatory heparin level with am labs   -Daily heparin level, CBC   Pollyann Samples, PharmD, BCPS 09/20/2017, 9:06 PM

## 2017-09-21 ENCOUNTER — Encounter (HOSPITAL_COMMUNITY): Payer: Self-pay | Admitting: Student

## 2017-09-21 ENCOUNTER — Inpatient Hospital Stay (HOSPITAL_COMMUNITY): Payer: Medicare Other

## 2017-09-21 DIAGNOSIS — G459 Transient cerebral ischemic attack, unspecified: Secondary | ICD-10-CM

## 2017-09-21 DIAGNOSIS — R55 Syncope and collapse: Secondary | ICD-10-CM

## 2017-09-21 LAB — CBC WITH DIFFERENTIAL/PLATELET
BASOS PCT: 0 %
Basophils Absolute: 0 10*3/uL (ref 0.0–0.1)
EOS ABS: 0 10*3/uL (ref 0.0–0.7)
Eosinophils Relative: 0 %
HCT: 25.5 % — ABNORMAL LOW (ref 39.0–52.0)
HEMOGLOBIN: 8.3 g/dL — AB (ref 13.0–17.0)
LYMPHS ABS: 1 10*3/uL (ref 0.7–4.0)
Lymphocytes Relative: 5 %
MCH: 31.8 pg (ref 26.0–34.0)
MCHC: 32.5 g/dL (ref 30.0–36.0)
MCV: 97.7 fL (ref 78.0–100.0)
Monocytes Absolute: 1.7 10*3/uL — ABNORMAL HIGH (ref 0.1–1.0)
Monocytes Relative: 7 %
NEUTROS PCT: 88 %
Neutro Abs: 19.8 10*3/uL — ABNORMAL HIGH (ref 1.7–7.7)
PLATELETS: 95 10*3/uL — AB (ref 150–400)
RBC: 2.61 MIL/uL — AB (ref 4.22–5.81)
RDW: 14.1 % (ref 11.5–15.5)
WBC: 22.5 10*3/uL — AB (ref 4.0–10.5)

## 2017-09-21 LAB — VAS US CAROTID
LCCADDIAS: -19 cm/s
LCCADSYS: -89 cm/s
LCCAPDIAS: 20 cm/s
LCCAPSYS: 85 cm/s
LEFT ECA DIAS: 5 cm/s
LEFT VERTEBRAL DIAS: 33 cm/s
LICADDIAS: -22 cm/s
LICADSYS: -108 cm/s
LICAPDIAS: 14 cm/s
LICAPSYS: 79 cm/s
RCCAPSYS: 63 cm/s
RIGHT ECA DIAS: -12 cm/s
RIGHT VERTEBRAL DIAS: 9 cm/s
Right CCA prox dias: 12 cm/s
Right cca dist sys: -46 cm/s

## 2017-09-21 LAB — PHOSPHORUS
PHOSPHORUS: 6.4 mg/dL — AB (ref 2.5–4.6)
Phosphorus: 5.6 mg/dL — ABNORMAL HIGH (ref 2.5–4.6)

## 2017-09-21 LAB — BASIC METABOLIC PANEL
Anion gap: 12 (ref 5–15)
BUN: 64 mg/dL — ABNORMAL HIGH (ref 6–20)
CHLORIDE: 102 mmol/L (ref 101–111)
CO2: 19 mmol/L — ABNORMAL LOW (ref 22–32)
CREATININE: 3.87 mg/dL — AB (ref 0.61–1.24)
Calcium: 7.7 mg/dL — ABNORMAL LOW (ref 8.9–10.3)
GFR calc non Af Amer: 13 mL/min — ABNORMAL LOW (ref >60)
GFR, EST AFRICAN AMERICAN: 15 mL/min — AB (ref >60)
Glucose, Bld: 133 mg/dL — ABNORMAL HIGH (ref 65–99)
Potassium: 4.9 mmol/L (ref 3.5–5.1)
SODIUM: 133 mmol/L — AB (ref 135–145)

## 2017-09-21 LAB — GLUCOSE, CAPILLARY
GLUCOSE-CAPILLARY: 118 mg/dL — AB (ref 65–99)
GLUCOSE-CAPILLARY: 118 mg/dL — AB (ref 65–99)
GLUCOSE-CAPILLARY: 131 mg/dL — AB (ref 65–99)
GLUCOSE-CAPILLARY: 148 mg/dL — AB (ref 65–99)
GLUCOSE-CAPILLARY: 161 mg/dL — AB (ref 65–99)
Glucose-Capillary: 145 mg/dL — ABNORMAL HIGH (ref 65–99)
Glucose-Capillary: 146 mg/dL — ABNORMAL HIGH (ref 65–99)

## 2017-09-21 LAB — URINE CULTURE: Culture: NO GROWTH

## 2017-09-21 LAB — TROPONIN I: Troponin I: 0.48 ng/mL (ref <0.03)

## 2017-09-21 LAB — MAGNESIUM
MAGNESIUM: 2.4 mg/dL (ref 1.7–2.4)
Magnesium: 2.4 mg/dL (ref 1.7–2.4)

## 2017-09-21 LAB — HEPARIN LEVEL (UNFRACTIONATED): Heparin Unfractionated: 0.66 IU/mL (ref 0.30–0.70)

## 2017-09-21 MED ORDER — DEXMEDETOMIDINE HCL IN NACL 400 MCG/100ML IV SOLN
0.4000 ug/kg/h | INTRAVENOUS | Status: DC
  Administered 2017-09-21 – 2017-09-23 (×3): 0.4 ug/kg/h via INTRAVENOUS
  Filled 2017-09-21 (×4): qty 100

## 2017-09-21 MED ORDER — SODIUM CHLORIDE 0.9 % IV SOLN
0.4000 ug/kg/h | INTRAVENOUS | Status: DC
  Administered 2017-09-21 (×2): 0.4 ug/kg/h via INTRAVENOUS
  Filled 2017-09-21 (×2): qty 2

## 2017-09-21 MED ORDER — VITAL AF 1.2 CAL PO LIQD
1000.0000 mL | ORAL | Status: DC
  Administered 2017-09-21 – 2017-09-22 (×2): 1000 mL

## 2017-09-21 MED ORDER — PRO-STAT SUGAR FREE PO LIQD
30.0000 mL | Freq: Two times a day (BID) | ORAL | Status: DC
  Administered 2017-09-21: 30 mL
  Filled 2017-09-21: qty 30

## 2017-09-21 MED ORDER — DOCUSATE SODIUM 50 MG/5ML PO LIQD
100.0000 mg | Freq: Two times a day (BID) | ORAL | Status: DC
  Administered 2017-09-21 – 2017-09-23 (×5): 100 mg
  Filled 2017-09-21 (×6): qty 10

## 2017-09-21 MED ORDER — VITAL HIGH PROTEIN PO LIQD
1000.0000 mL | ORAL | Status: DC
  Administered 2017-09-21: 1000 mL

## 2017-09-21 MED ORDER — PRO-STAT SUGAR FREE PO LIQD
30.0000 mL | Freq: Every day | ORAL | Status: DC
  Administered 2017-09-22 – 2017-09-23 (×2): 30 mL
  Filled 2017-09-21 (×2): qty 30

## 2017-09-21 MED FILL — Medication: Qty: 1 | Status: AC

## 2017-09-21 NOTE — Progress Notes (Signed)
SLP Cancellation Note  Patient Details Name: Adam Weeks. MRN: [id number] DOB: [birth year]   Cancelled treatment:       Reason Eval/Treat Not Completed: Medical issues which prohibited therapy (remains intubated this am).   Maxcine Ham 09/21/2017, 9:04 AM  Maxcine Ham, M.A. CCC-SLP [phone]

## 2017-09-21 NOTE — Progress Notes (Signed)
Progress Note  Patient Name: Adam Weeks. Date of Encounter: 09/21/2017  Primary Cardiologist: Dr. Oval Linsey (new)  Subjective   Intubated and sedated.  Unable to obtain.   Inpatient Medications    Scheduled Meds: . acetaminophen  500 mg Per Tube TID  . allopurinol  100 mg Per Tube Daily  . aspirin  81 mg Per Tube BID  . atorvastatin  40 mg Per Tube Daily  . chlorhexidine gluconate (MEDLINE KIT)  15 mL Mouth Rinse BID  . Chlorhexidine Gluconate Cloth  6 each Topical Q0600  . cholecalciferol  1,000 Units Oral Daily  . docusate sodium  100 mg Oral BID  . feeding supplement (PRO-STAT SUGAR FREE 64)  30 mL Per Tube BID  . feeding supplement (VITAL HIGH PROTEIN)  1,000 mL Per Tube Q24H  . hydrocortisone sodium succinate  50 mg Intravenous Q6H  . insulin aspart  2-6 Units Subcutaneous Q4H  . mouth rinse  15 mL Mouth Rinse QID  . multivitamin  2 tablet Oral BID  . mupirocin ointment  1 application Nasal BID  . pantoprazole sodium  40 mg Per Tube BID   Continuous Infusions: . fentaNYL infusion INTRAVENOUS 150 mcg/hr (09/21/17 0800)  . heparin 1,100 Units/hr (09/21/17 0400)  . norepinephrine (LEVOPHED) Adult infusion 7 mcg/min (09/21/17 0800)   PRN Meds: acetaminophen **OR** acetaminophen, docusate, HYDROcodone-acetaminophen, lidocaine, menthol-cetylpyridinium **OR** phenol, midazolam, ondansetron **OR** ondansetron (ZOFRAN) IV   Vital Signs    Vitals:   09/21/17 0630 09/21/17 0645 09/21/17 0700 09/21/17 0800  BP: 103/84 129/62 (!) 118/57 (!) 155/73  Pulse: 79 74 70 91  Resp: (!) 24 17 15 18   Temp:      TempSrc:      SpO2: 97% 98% 99% 96%  Weight:        Intake/Output Summary (Last 24 hours) at 09/21/17 0842 Last data filed at 09/21/17 0800  Gross per 24 hour  Intake          1832.98 ml  Output              110 ml  Net          1722.98 ml   Filed Weights   09/03/2017 2000 09/15/2017 0457  Weight: 75.4 kg (166 lb 3.6 oz) 77 kg (169 lb 12.1 oz)    Telemetry      Sinus rhythm.  PACs.  11 beat run of SVT.  - Personally Reviewed  ECG    N/a - Personally Reviewed  Physical Exam   VS:  BP (!) 155/73   Pulse 91   Temp 98.2 F (36.8 C) (Axillary)   Resp 18   Wt 77 kg (169 lb 12.1 oz)   SpO2 96%   BMI 24.01 kg/m  , BMI Body mass index is 24.01 kg/m. GENERAL:  Intubated and sedated. HEENT: Pupils equal round and reactive, fundi not visualized, oral mucosa unremarkable NECK:  No jugular venous distention, waveform within normal limits, carotid upstroke brisk and symmetric, no bruits, no thyromegaly LUNGS:  Clear to auscultation bilaterally on anterior exam. HEART:  RRR.  PMI not displaced or sustained,S1 and S2 within normal limits, no S3, no S4, no clicks, no rubs, no murmurs ABD:  Flat, positive bowel sounds normal in frequency in pitch, no bruits, no rebound, no guarding, no midline pulsatile mass, no hepatomegaly, no splenomegaly EXT:  2 plus pulses throughout, no edema, no cyanosis no clubbing SKIN:  No rashes no nodules NEURO:  Cranial nerves II through  XII grossly intact, motor grossly intact throughout [facility]:  Cognitively intact, oriented to person place and time   Labs    Chemistry Recent Labs Lab 09/23/2017 2002  09/24/2017 1416 09/20/17 0219 09/21/17 0519  NA 140  < > 141 137 133*  K 5.0  < > 4.6 4.5 4.9  CL 106  < > 111 108 102  CO2 18*  --  20* 16* 19*  GLUCOSE 133*  < > 113* 119* 133*  BUN 42*  < > 38* 43* 64*  CREATININE 1.67*  < > 1.80* 2.49* 3.87*  CALCIUM 8.8*  --  7.8* 7.5* 7.7*  PROT 6.5  --   --   --   --   ALBUMIN 3.5  --   --   --   --   AST 25  --   --   --   --   ALT 19  --   --   --   --   ALKPHOS 59  --   --   --   --   BILITOT 0.8  --   --   --   --   GFRNONAA 35*  --  32* 22* 13*  GFRAA 41*  --  37* 25* 15*  ANIONGAP 16*  --  10 13 12   < > = values in this interval not displayed.   Hematology Recent Labs Lab 09/27/2017 1426 09/20/17 0219 09/21/17 0519  WBC 22.1* 24.4* 22.5*  RBC 3.23* 3.12*  2.61*  HGB 10.3* 9.9* 8.3*  HCT 32.6* 31.5* 25.5*  MCV 100.9* 101.0* 97.7  MCH 31.9 31.7 31.8  MCHC 31.6 31.4 32.5  RDW 13.9 14.0 14.1  PLT 117* 100* 95*    Cardiac Enzymes Recent Labs Lab 09/20/17 0908  TROPONINI 1.88*    Recent Labs Lab 09/06/2017 2006  TROPIPOC 0.04     BNPNo results for input(s): BNP, PROBNP in the last 168 hours.   DDimer No results for input(s): DDIMER in the last 168 hours.   Radiology    Dg Pelvis Portable  Result Date: 09/09/2017 CLINICAL DATA:  Femoral neck fracture. EXAM: PORTABLE PELVIS 1-2 VIEWS COMPARISON:  09/15/2017 FINDINGS: Patient now has a right hip arthroplasty. The arthroplasty appears located on this single view. Pelvic bony ring is intact. Multiple phleboliths in the pelvis. Again noted is mild joint space narrowing in the left hip joint. Lucency in the right upper leg soft tissues compatible with recent surgery. IMPRESSION: Right hip arthroplasty without complicating features. Electronically Signed   By: Markus Daft M.D.   On: 09/24/2017 13:40   Dg Chest Port 1 View  Result Date: 09/21/2017 CLINICAL DATA:  Cardiac arrest. EXAM: PORTABLE CHEST 1 VIEW COMPARISON:  09/23/2017 FINDINGS: Patient rotated left. Endotracheal tube terminates 4.0 cm above carina.Nasogastric tube extends beyond the inferior aspect of the film. Prior median sternotomy. Cardiomegaly accentuated by AP portable technique. Probable small left pleural effusion. No pneumothorax. Mild interstitial edema, with decreased lung volumes. Worsened left and developing right base airspace disease. Skin fold over the right hemithorax. IMPRESSION: Overall worsened aeration, with decreased lung volumes and development of mild pulmonary venous congestion. Progressive left and developing right base airspace disease. Probable small left pleural effusion. Electronically Signed   By: Abigail Miyamoto M.D.   On: 09/21/2017 06:43   Dg Chest Port 1 View  Result Date: 09/22/2017 CLINICAL DATA:   Intubated EXAM: PORTABLE CHEST 1 VIEW COMPARISON:  09/08/2017 FINDINGS: Endotracheal tube tip has been placed, the tip is  about 2.5 cm superior to carina. Esophageal tube tip overlies the left upper quadrant. Left diaphragm is elevated. Post sternotomy changes. Cardiomegaly. Small left pleural effusion and left basilar atelectasis or pneumonia. Atherosclerosis. No pneumothorax. IMPRESSION: 1. Endotracheal tube tip is about 2.5 cm superior to carina 2. Esophageal tube tip is in the left upper quadrant beneath an elevated left diaphragm 3. Cardiomegaly. Development of a small left pleural effusion and left basilar atelectasis or pneumonia Electronically Signed   By: Donavan Foil M.D.   On: 09/06/2017 22:40    Cardiac Studies   Echo 09/20/17: Study Conclusions  - Left ventricle: The cavity size was normal. There was moderate   concentric hypertrophy. Systolic function was normal. The   estimated ejection fraction was in the range of 55% to 60%. There   was dynamic obstruction. There was dynamic obstruction at rest,   with a peak velocity of 110 cm/sec and a peak gradient of 5 mm   Hg. Doppler parameters are consistent with abnormal left   ventricular relaxation (grade 1 diastolic dysfunction). - Aortic valve: A bioprosthesis was present. There was mild   stenosis. Peak velocity (S): 302 cm/s. Mean gradient (S): 18 mm   Hg. Valve area (VTI): 1.52 cm^2. Valve area (Vmax): 1.13 cm^2.   Valve area (Vmean): 1.23 cm^2. - Mitral valve: Mildly calcified annulus. - Right ventricle: The cavity size was normal. Wall thickness was   normal. Systolic function was normal. - Tricuspid valve: There was moderate regurgitation.  Patient Profile     Mr. Allaire is an 73M with with CAD s/p CABG, severe aortic stenosis s/p AVR, CKD 3, and hypertension here with hip fracture after he slipped and fell washing his car.  Assessment & Plan    # Paroxysmal atrial fibrillation: # Bradycardia: Mr. Tout went into  atrial fibrillation post-operatively and developed profound bradycardia after receiving IV beta blocker.  9/23 he was started on amiodarone but his heart rate decreased to the 60s and he became hypotensive so it was stopped.  Heart rhythm has been stable.  He had an 11 beat run of SVT in the last 24 hours.  Continue to monitor for now.  Of course, avoiding nodal agents.  Will plan for a 30 day event monitor at discharge if he doesn't have any more episodes of bradycardia in the hospital.  Plan to continue heparin for 48 hours.  OK to stop if no recurrent atrial fibrillation.  # CAD s/p CABG:  # Elevated troponin:  Troponin was elevated to 1.8 yesterday. Will repeat to assess trend.  It is unclear if ischemia may have contributed to his initially bradycardia and arrest.  We will plan for stress vs. Cath once stable and depending on the trend of his troponin.  Continue aspirin and atorvastatin.  No beta blocker as above.  Systolic function was normal on echo.  If troponin is rising, would continue heparin rather than stop as above.   # s/p bioprosthetic AVR: Valve functioning properly on echo.  Mean gradient 18 mmHg.  Time spent: 40 minutes-Greater than 50% of this time was spent in counseling, explanation of diagnosis, planning of further management, and coordination of care.   For questions or updates, please contact [place] Please consult [email or web address] for contact info under Cardiology/STEMI.      Signed, Skeet Latch, MD  09/21/2017, 8:42 AM

## 2017-09-21 NOTE — Progress Notes (Signed)
CSW consulted for SNF placement. CSW reached out to pt's son's Onalee Hua and Lorin Picket  To complete assessment as pt is intubated at this time. CSW was informed that family members are a little busy at this time and would call CSW back when free. CSW will continue to assist with getting assessment complete.     Claude Manges Odilia Damico, MSW, LCSW-A Emergency Department Clinical Social Worker [phone]

## 2017-09-21 NOTE — Progress Notes (Signed)
Nutrition Consult / Follow-up  DOCUMENTATION CODES:   Not applicable  INTERVENTION:    Vital AF 1.2 at 55 ml/h (1320 ml per day)  Pro-stat 30 ml once daily  Provides 1684 kcal, 114 gm protein, 1071 ml free water daily  NUTRITION DIAGNOSIS:   Increased nutrient needs related to wound healing as evidenced by estimated needs.  Ongoing  GOAL:   Patient will meet greater than or equal to 90% of their needs  Being addressed with TF  MONITOR:   Vent status, TF tolerance, Skin, Labs, I & O's  REASON FOR ASSESSMENT:   Consult Enteral/tube feeding initiation and management  ASSESSMENT:   Pt with PMH of TIA, HTN, CAD s/p CABG, AVR s/p pig valve, CKD stage 3 admitted after fall/TIA with closed hip fx now s/p repair.   Discussed patient in ICU rounds and with RN today. Received MD Consult for TF initiation and management. Patient is currently receiving Vital High Protein via OGT at 40 ml/h (960 ml/day) with Prostat 30 ml TID to provide 1160 kcals, 114 gm protein, 803 ml free water daily.  Nutrition-Focused physical exam completed. Findings are no fat depletion, no muscle depletion, and moderate edema.  Patient is currently intubated on ventilator support MV: 9.6 L/min Temp (24hrs), Avg:98.2 F (36.8 C), Min:97.9 F (36.6 C), Max:98.8 F (37.1 C)  Labs reviewed: sodium 133 (L), phosphorus 6.4 (H) CBG's: [phone] Medications reviewed and include vitamin D, Colace, MVI  Diet Order:   NPO  Skin:   (surgical incisions, skin tears)  Last BM:  unknown  Height:   Ht Readings from Last 1 Encounters:  09/01/17 5' 10.5" (1.791 m)    Weight:   Wt Readings from Last 1 Encounters:  09/26/2017 169 lb 12.1 oz (77 kg)    Ideal Body Weight:  75.4 kg  BMI:  Body mass index is 24.01 kg/m.  Estimated Nutritional Needs:   Kcal:  1685  Protein:  110-125 gm  Fluid:  1.7-1.9 L  EDUCATION NEEDS:   No education needs identified at this time  Joaquin Courts, RD,  LDN, CNSC Pager [phone] After Hours Pager [phone]

## 2017-09-21 NOTE — Progress Notes (Signed)
Patient had 4 beat run of vtach. No further ectopy noted at this time. MD notified.  Also attempting to get 3rd piv site. Very poor access. MD aware, will call back with any further issues.

## 2017-09-21 NOTE — Progress Notes (Addendum)
ANTICOAGULATION CONSULT NOTE   Pharmacy Consult for heparin Indication: atrial fibrillation  Allergies  Allergen Reactions  . Gabapentin     jumpy    Patient Measurements: Weight: 169 lb 12.1 oz (77 kg) Heparin Dosing Weight: 77 kg  Vital Signs: Temp: 98.2 F (36.8 C) (09/24 0455) Temp Source: Oral (09/24 0455) BP: 115/54 (09/24 0115) Pulse Rate: 68 (09/24 0115)  Labs:  Recent Labs  2017/10/18 2002 10/18/17 2007 09/03/2017 1416 09/21/2017 1426 09/20/17 0219 09/20/17 0908 09/20/17 2025 09/21/17 0519  HGB 12.1* 12.2*  --  10.3* 9.9*  --   --  8.3*  HCT 37.4* 36.0*  --  32.6* 31.5*  --   --  25.5*  PLT 156  --   --  117* 100*  --   --  95*  APTT 35  --   --   --   --   --   --   --   LABPROT 14.1  --   --   --   --   --   --   --   INR 1.10  --   --   --   --   --   --   --   HEPARINUNFRC  --   --   --   --   --   --  0.51 0.66  CREATININE 1.67* 1.60* 1.80*  --  2.49*  --   --   --   CKTOTAL 45*  --   --   --   --   --   --   --   TROPONINI  --   --   --   --   --  1.88*  --   --     Estimated Creatinine Clearance: 21.5 mL/min (A) (by C-G formula based on SCr of 2.49 mg/dL (H)).   Medical History: Past Medical History:  Diagnosis Date  . CAD (coronary artery disease)   . Chronic kidney disease    stones  . GERD (gastroesophageal reflux disease)   . History of gout 09/28/2014   2014  . Hyperlipidemia   . Hypertension   . Osteoporosis   . Stroke [facility])    TIA     Assessment: 83 yo male admitted with after falling s/p R hip hemiarthroplasty in ED. Was aphasic upon admission, was thought to have an ischemic stroke, also new onset AFib found. Pt has been cleared by ortho for anticoagulation. CBC trending down slightly, no bleeding noted.  heparin level is therapeutic today at 0.66  Goal of Therapy:  Heparin level 0.3-0.7 units/ml Monitor platelets by anticoagulation protocol: Yes   Plan:  Continue heparin gtt at 1100 units/hr Monitor daily CBC, heparin  level, s/s bleeding F/u plans for oral [facility]  Daylene Posey, PharmD Pharmacy Resident Pager #: [phone] 09/21/2017 6:28 AM  Addendum: Will lower by 50 units/hr to 1050 units/hr d/t worsening renal function and heparin level being close to upper limit of goal.  Will f/u levels in AM.

## 2017-09-21 NOTE — Progress Notes (Signed)
Pt placed back on full support due to continued periods of apnea.  RT will continue to monitor.

## 2017-09-21 NOTE — Progress Notes (Signed)
Orthopaedic Trauma Progress Note  S: Still intubated. Both sons at bedside  O:  Vitals:   09/21/17 0847 09/21/17 0925  BP:    Pulse:  79  Resp:  19  Temp: 98.2 F (36.8 C)   SpO2:  96%  RLE: Dressing intact, no significant swelling about hip. Gentle ROM without discomfort. Unable to fully cooperate with neuro exam  Labs: Hgb 8.3/25.5 WBC 22.5  A/P: POD2 s/p right hip hemi, coded POD0  -WBAT, no hip ROM restrictions -May continue heparin as needed -Will continue to follow   Roby Lofts, MD [facility] [phone] (phone)

## 2017-09-21 NOTE — Progress Notes (Signed)
PT Cancellation Note  Patient Details Name: Adam Weeks. MRN: [id number] DOB: 1927/06/01   Cancelled Treatment:    Reason Eval/Treat Not Completed: Medical issues which prohibited therapy (remains intubated)   Angelina Ok [facility] 09/21/2017, 11:07 AM Skip Mayer PT [phone]

## 2017-09-21 NOTE — Progress Notes (Signed)
eLink Physician-Brief Progress Note Patient Name: Adam Weeks. DOB: Jun 07, 1927 MRN: [id number]   Date of Service  09/21/2017  HPI/Events of Note  No AM labs.  eICU Interventions  Will order: 1. CBC with diff and platelets, BMP and Mg++ level now.      Intervention Category Major Interventions: Other:  Ayeisha Lindenberger Dennard Nip 09/21/2017, 5:21 AM

## 2017-09-21 NOTE — Progress Notes (Addendum)
..   Name: Adam Weeks. MRN: [id number] DOB: January 04, 1927    ADMISSION DATE:  09/07/2017 CONSULTATION DATE: 10/10/17  REFERRING MD :  Janee Morn MD CHIEF COMPLAINT:    BRIEF PATIENT DESCRIPTION: 83 YR OLD MALE PRESENTED ON 09/15/2017 S/P FALL, a displaced right femoral neck fracture, POD 0 Right hip hemiarthroplasty. Noted to Afib RVR received Metoprolol 2.5mg  bradycardic S/P CARDIOPULMONARY ARREST  SIGNIFICANT EVENTS  S/p PEA arrest  POD O Right hemarthroplasty  STUDIES:  CTA Head and neck- 08/29/2017 CXR XR ELBOW/HUMERUS/LUMBAR SPINE/PELVIS MRI BRAIN 10-10-2017  SUBJECTIVE: echo being performed Cards consult Hep started Echo performed  VITAL SIGNS: Temp:  [97.9 F (36.6 C)-99 F (37.2 C)] 98.2 F (36.8 C) (09/24 0847) Pulse Rate:  [62-99] 91 (09/24 0800) Resp:  [15-28] 18 (09/24 0800) BP: (70-155)/(42-91) 155/73 (09/24 0800) SpO2:  [89 %-99 %] 96 % (09/24 0800) FiO2 (%):  [40 %-50 %] 40 % (09/24 0800)  PHYSICAL EXAMINATION: General: sedated rass -2 Neuro: rass -2, does nOT fc, opens eyes, strength is good HEENT: jvd present PULM: coarse bases CV:  s1 s2 rrr 74 , no r GI: soft, BS wnl, no r Extremities: edema present, no rash    Recent Labs Lab 2017/10/10 1416 09/20/17 0219 09/21/17 0519  NA 141 137 133*  K 4.6 4.5 4.9  CL 111 108 102  CO2 20* 16* 19*  BUN 38* 43* 64*  CREATININE 1.80* 2.49* 3.87*  GLUCOSE 113* 119* 133*    Recent Labs Lab 10-10-2017 1426 09/20/17 0219 09/21/17 0519  HGB 10.3* 9.9* 8.3*  HCT 32.6* 31.5* 25.5*  WBC 22.1* 24.4* 22.5*  PLT 117* 100* 95*   Dg Pelvis Portable  Result Date: Oct 10, 2017 CLINICAL DATA:  Femoral neck fracture. EXAM: PORTABLE PELVIS 1-2 VIEWS COMPARISON:  09/04/2017 FINDINGS: Patient now has a right hip arthroplasty. The arthroplasty appears located on this single view. Pelvic bony ring is intact. Multiple phleboliths in the pelvis. Again noted is mild joint space narrowing in the left hip joint. Lucency in the  right upper leg soft tissues compatible with recent surgery. IMPRESSION: Right hip arthroplasty without complicating features. Electronically Signed   By: Richarda Overlie M.D.   On: 10-10-2017 13:40   Dg Chest Port 1 View  Result Date: 09/21/2017 CLINICAL DATA:  Cardiac arrest. EXAM: PORTABLE CHEST 1 VIEW COMPARISON:  Oct 10, 2017 FINDINGS: Patient rotated left. Endotracheal tube terminates 4.0 cm above carina.Nasogastric tube extends beyond the inferior aspect of the film. Prior median sternotomy. Cardiomegaly accentuated by AP portable technique. Probable small left pleural effusion. No pneumothorax. Mild interstitial edema, with decreased lung volumes. Worsened left and developing right base airspace disease. Skin fold over the right hemithorax. IMPRESSION: Overall worsened aeration, with decreased lung volumes and development of mild pulmonary venous congestion. Progressive left and developing right base airspace disease. Probable small left pleural effusion. Electronically Signed   By: Jeronimo Greaves M.D.   On: 09/21/2017 06:43   Dg Chest Port 1 View  Result Date: 10-Oct-2017 CLINICAL DATA:  Intubated EXAM: PORTABLE CHEST 1 VIEW COMPARISON:  08/30/2017 FINDINGS: Endotracheal tube tip has been placed, the tip is about 2.5 cm superior to carina. Esophageal tube tip overlies the left upper quadrant. Left diaphragm is elevated. Post sternotomy changes. Cardiomegaly. Small left pleural effusion and left basilar atelectasis or pneumonia. Atherosclerosis. No pneumothorax. IMPRESSION: 1. Endotracheal tube tip is about 2.5 cm superior to carina 2. Esophageal tube tip is in the left upper quadrant beneath an elevated left diaphragm 3. Cardiomegaly. Development  of a small left pleural effusion and left basilar atelectasis or pneumonia Electronically Signed   By: Jasmine Pang M.D.   On: 10/12/2017 22:40     ASSESSMENT / PLAN:  NEURO: H/o Falls Prior h/o CVA Chronic microhemorrhage of the Right Cerebellum on  MRI Neurology following narcs use chronic -fent drip with wua -received versed, I would like to avoid versed with his age and delirium risk - dc versed -Continues on Aspirin 81 mg  -Would favor precedex use even if lowers BP slight  CARDIAC: S/p cardiac arrest Most likely due to beta blockade, rule out active ischemia UNLIKELY PE ( RV WNL) -echo reassuring overall -heparin x 48 hours then dc -asa -Appreciate cardiology assesment -doppler legs and combine with normal RV  H/o HTN HLD, CAD Bioprosthetic Aortic valve Adrenal insuff, pred dep  MAP goal >61mmHg Levophed to MAP goals Stress roids  Afib RVR prior to code Tele amio off with slow rate prior  PULMONARY: Airway protection  Cardiopulmonary arrest pulm edema mild on pcxr ID: SBT with back up rate cpap 5 ps 5-10 pcxr poor technique and penetration, repeat in am  Even balance goals  Endocrine: H/o IDDM BG goal 140-180mg /dl On ICU Glycemic protocol -presume AI -steroids  GI:  Start feeds ppi ensure   Heme: Per Ortho note ok for eliquis on POD 1 - will call orhto and discuss use heparin IV now hemodiltuion ' RENAL AG metabolic Acidosis  Acute Kidney Injury worsening NONAG acidosis Allow biocarb to dc Is dependent on diuretic at home On pressors, if remains in am still would start diuretic May need cvp placed  I udpated son  Ccm time 35 min   Mcarthur Rossetti. Tyson Alias, MD, FACP Pgr: [phone] [place] [facility]

## 2017-09-22 ENCOUNTER — Inpatient Hospital Stay (HOSPITAL_COMMUNITY): Payer: Medicare Other

## 2017-09-22 DIAGNOSIS — N179 Acute kidney failure, unspecified: Secondary | ICD-10-CM

## 2017-09-22 DIAGNOSIS — J9601 Acute respiratory failure with hypoxia: Secondary | ICD-10-CM

## 2017-09-22 DIAGNOSIS — N183 Chronic kidney disease, stage 3 (moderate): Secondary | ICD-10-CM

## 2017-09-22 DIAGNOSIS — M7989 Other specified soft tissue disorders: Secondary | ICD-10-CM

## 2017-09-22 DIAGNOSIS — J9600 Acute respiratory failure, unspecified whether with hypoxia or hypercapnia: Secondary | ICD-10-CM

## 2017-09-22 DIAGNOSIS — I10 Essential (primary) hypertension: Secondary | ICD-10-CM

## 2017-09-22 DIAGNOSIS — R34 Anuria and oliguria: Secondary | ICD-10-CM

## 2017-09-22 LAB — BASIC METABOLIC PANEL
ANION GAP: 12 (ref 5–15)
Anion gap: 12 (ref 5–15)
BUN: 84 mg/dL — ABNORMAL HIGH (ref 6–20)
BUN: 96 mg/dL — ABNORMAL HIGH (ref 6–20)
CALCIUM: 7.2 mg/dL — AB (ref 8.9–10.3)
CALCIUM: 7.3 mg/dL — AB (ref 8.9–10.3)
CO2: 18 mmol/L — AB (ref 22–32)
CO2: 20 mmol/L — ABNORMAL LOW (ref 22–32)
CREATININE: 5.35 mg/dL — AB (ref 0.61–1.24)
Chloride: 104 mmol/L (ref 101–111)
Chloride: 102 mmol/L (ref 101–111)
Creatinine, Ser: 4.83 mg/dL — ABNORMAL HIGH (ref 0.61–1.24)
GFR calc non Af Amer: 10 mL/min — ABNORMAL LOW (ref >60)
GFR, EST AFRICAN AMERICAN: 11 mL/min — AB (ref >60)
GFR, EST AFRICAN AMERICAN: 10 mL/min — AB (ref >60)
GFR, EST NON AFRICAN AMERICAN: 9 mL/min — AB (ref >60)
Glucose, Bld: 156 mg/dL — ABNORMAL HIGH (ref 65–99)
Glucose, Bld: 126 mg/dL — ABNORMAL HIGH (ref 65–99)
Potassium: 5 mmol/L (ref 3.5–5.1)
Potassium: 4.8 mmol/L (ref 3.5–5.1)
Sodium: 134 mmol/L — ABNORMAL LOW (ref 135–145)
Sodium: 134 mmol/L — ABNORMAL LOW (ref 135–145)

## 2017-09-22 LAB — GLUCOSE, CAPILLARY
GLUCOSE-CAPILLARY: 136 mg/dL — AB (ref 65–99)
GLUCOSE-CAPILLARY: 141 mg/dL — AB (ref 65–99)
GLUCOSE-CAPILLARY: 144 mg/dL — AB (ref 65–99)
Glucose-Capillary: 164 mg/dL — ABNORMAL HIGH (ref 65–99)
Glucose-Capillary: 153 mg/dL — ABNORMAL HIGH (ref 65–99)

## 2017-09-22 LAB — CBC
HCT: 21.9 % — ABNORMAL LOW (ref 39.0–52.0)
HEMOGLOBIN: 7.1 g/dL — AB (ref 13.0–17.0)
MCH: 32.1 pg (ref 26.0–34.0)
MCHC: 32.4 g/dL (ref 30.0–36.0)
MCV: 99.1 fL (ref 78.0–100.0)
PLATELETS: 100 10*3/uL — AB (ref 150–400)
RBC: 2.21 MIL/uL — AB (ref 4.22–5.81)
RDW: 14 % (ref 11.5–15.5)
WBC: 15.7 10*3/uL — ABNORMAL HIGH (ref 4.0–10.5)

## 2017-09-22 LAB — HEMOGLOBIN AND HEMATOCRIT, BLOOD
HCT: 22.3 % — ABNORMAL LOW (ref 39.0–52.0)
Hemoglobin: 7.3 g/dL — ABNORMAL LOW (ref 13.0–17.0)

## 2017-09-22 LAB — PHOSPHORUS
Phosphorus: 6.5 mg/dL — ABNORMAL HIGH (ref 2.5–4.6)
Phosphorus: 6.4 mg/dL — ABNORMAL HIGH (ref 2.5–4.6)

## 2017-09-22 LAB — PREPARE RBC (CROSSMATCH)

## 2017-09-22 LAB — HEPARIN LEVEL (UNFRACTIONATED): HEPARIN UNFRACTIONATED: 0.44 [IU]/mL (ref 0.30–0.70)

## 2017-09-22 LAB — MAGNESIUM
Magnesium: 2.5 mg/dL — ABNORMAL HIGH (ref 1.7–2.4)
Magnesium: 2.5 mg/dL — ABNORMAL HIGH (ref 1.7–2.4)

## 2017-09-22 MED ORDER — HYDROCORTISONE NA SUCCINATE PF 100 MG IJ SOLR
50.0000 mg | Freq: Two times a day (BID) | INTRAMUSCULAR | Status: DC
  Administered 2017-09-22 – 2017-09-24 (×4): 50 mg via INTRAVENOUS
  Filled 2017-09-22 (×4): qty 1

## 2017-09-22 MED ORDER — SODIUM CHLORIDE 0.9 % IV SOLN: Freq: Once | INTRAVENOUS | Status: DC

## 2017-09-22 MED ORDER — HEPARIN SODIUM (PORCINE) 1000 UNIT/ML IJ SOLN
3000.0000 [IU] | Freq: Once | INTRAMUSCULAR | Status: AC
  Administered 2017-09-22: 2400 [IU] via INTRAVENOUS
  Filled 2017-09-22: qty 3

## 2017-09-22 MED ORDER — FUROSEMIDE 10 MG/ML IJ SOLN
40.0000 mg | Freq: Once | INTRAMUSCULAR | Status: AC
  Administered 2017-09-22: 40 mg via INTRAVENOUS
  Filled 2017-09-22: qty 4

## 2017-09-22 NOTE — Progress Notes (Signed)
ANTICOAGULATION CONSULT NOTE   Pharmacy Consult for heparin Indication: atrial fibrillation  Allergies  Allergen Reactions  . Gabapentin     jumpy    Patient Measurements: Weight: 169 lb 12.1 oz (77 kg) Heparin Dosing Weight: 77 kg  Vital Signs: Temp: 98.2 F (36.8 C) (09/25 0329) Temp Source: Oral (09/25 0329) BP: 85/51 (09/25 0600) Pulse Rate: 65 (09/25 0600)  Labs:  Recent Labs  09/12/2017 1416  09/20/17 0219 09/20/17 0908 09/20/17 2025 09/21/17 0519 09/21/17 0950 09/22/17 0503  HGB  --   < > 9.9*  --   --  8.3*  --  7.1*  HCT  --   < > 31.5*  --   --  25.5*  --  21.9*  PLT  --   < > 100*  --   --  95*  --  100*  HEPARINUNFRC  --   --   --   --  0.51 0.66  --  0.44  CREATININE 1.80*  --  2.49*  --   --  3.87*  --   --   TROPONINI  --   --   --  1.88*  --   --  0.48*  --   < > = values in this interval not displayed.  Estimated Creatinine Clearance: 13.8 mL/min (A) (by C-G formula based on SCr of 3.87 mg/dL (H)).   Medical History: Past Medical History:  Diagnosis Date  . CAD (coronary artery disease)   . Chronic kidney disease    stones  . GERD (gastroesophageal reflux disease)   . History of gout 09/28/2014   2014  . Hyperlipidemia   . Hypertension   . Osteoporosis   . Stroke [facility])    TIA     Assessment: 83 yo male admitted with after falling s/p R hip hemiarthroplasty in ED. Was aphasic upon admission, was thought to have an ischemic stroke, also new onset AFib found. Pt has been cleared by ortho for anticoagulation. H/H continues to trend down slightly without any bleeding observed, plts remain stable.  heparin level remains therapeutic today at 0.44  Goal of Therapy:  Heparin level 0.3-0.7 units/ml Monitor platelets by anticoagulation protocol: Yes   Plan:  Continue heparin gtt at 1050 units/hr Monitor daily CBC, heparin level, s/s bleeding F/u plans for oral [facility]  Daylene Posey, PharmD Pharmacy Resident Pager #: [phone] 09/22/2017 7:26  AM

## 2017-09-22 NOTE — Progress Notes (Signed)
RT note-Started wean again, tolerating well at this time, continue to monitor.

## 2017-09-22 NOTE — Progress Notes (Signed)
RT Note-Bath in progress, increased RR and work of breathing, placed back to full support.

## 2017-09-22 NOTE — Progress Notes (Signed)
..   Name: Adam Weeks. MRN: [id number] DOB: August 08, 1927    ADMISSION DATE:  10-02-17 CONSULTATION DATE: 09/15/2017  REFERRING MD :  Janee Morn MD CHIEF COMPLAINT:    BRIEF PATIENT DESCRIPTION: 83 YR OLD MALE PRESENTED ON 10-02-2017 S/P FALL, a displaced right femoral neck fracture,  Right hip hemiarthroplasty. Noted to Afib RVR received Metoprolol 2.5mg  bradycardic s/p PEA arrest 9/22  SIGNIFICANT EVENTS  9/22 S/p PEA arrest  9/22  Right hemarthroplasty  STUDIES:  CTA Head and neck- 10/02/2017 MRI BRAIN 09/26/2017 neg  SUBJECTIVE:  Off pressors Sedated on precedex 0.4 & fent gtt No obvious pain Occasional jerks all 4 Es  VITAL SIGNS: Temp:  [98 F (36.7 C)-98.7 F (37.1 C)] 98.5 F (36.9 C) (09/25 1154) Pulse Rate:  [64-96] 68 (09/25 1100) Resp:  [15-21] 15 (09/25 1100) BP: (84-129)/(47-71) 93/50 (09/25 1100) SpO2:  [86 %-100 %] 100 % (09/25 1114) FiO2 (%):  [40 %] 40 % (09/25 1251)  PHYSICAL EXAMINATION: General: sedated rass -1 , chr ill  Neuro: follows commands intermittently when sedation lowered HEENT: jvd present PULM: coarse bases CV:  s1 s2 rrr  , no murmur GI: soft, BS wnl, no r Extremities: edema present, no rash    Recent Labs Lab 09/20/17 0219 09/21/17 0519 09/22/17 0730  NA 137 133* 134*  K 4.5 4.9 5.0  CL 108 102 104  CO2 16* 19* 18*  BUN 43* 64* 84*  CREATININE 2.49* 3.87* 4.83*  GLUCOSE 119* 133* 156*    Recent Labs Lab 09/20/17 0219 09/21/17 0519 09/22/17 0503  HGB 9.9* 8.3* 7.1*  HCT 31.5* 25.5* 21.9*  WBC 24.4* 22.5* 15.7*  PLT 100* 95* 100*   Dg Chest Port 1 View  Result Date: 09/21/2017 CLINICAL DATA:  Cardiac arrest. EXAM: PORTABLE CHEST 1 VIEW COMPARISON:  08/30/2017 FINDINGS: Patient rotated left. Endotracheal tube terminates 4.0 cm above carina.Nasogastric tube extends beyond the inferior aspect of the film. Prior median sternotomy. Cardiomegaly accentuated by AP portable technique. Probable small left pleural effusion. No  pneumothorax. Mild interstitial edema, with decreased lung volumes. Worsened left and developing right base airspace disease. Skin fold over the right hemithorax. IMPRESSION: Overall worsened aeration, with decreased lung volumes and development of mild pulmonary venous congestion. Progressive left and developing right base airspace disease. Probable small left pleural effusion. Electronically Signed   By: Jeronimo Greaves M.D.   On: 09/21/2017 06:43     ASSESSMENT / PLAN:  NEURO: H/o Falls Prior h/o CVA Chronic microhemorrhage of the Right Cerebellum on MRI At risk anoxic encephalopathy Chronic pain on narcotics  -fent drip with wua -Aspirin 81 mg  -Off versed, ct precedex gtt, low dose- w/f brady  CARDIAC: S/p cardiac arrest Most likely due to beta blockade, rule out active ischemia Afib RVR prior to code UNLIKELY PE ( RV WNL) -echo reassuring overall -dc heparin - completed 48 hours -asa -Appreciate cardiology assesment -Await doppler legs  -Amio off  H/o HTN HLD, CAD Bioprosthetic Aortic valve MAP goal >36mmHg Levophed off Stress roids  Acute resp failure pulm edema mild on pcxr ID: SBTs as tolerated   RENAL AG metabolic Acidosis  AKI - worsening  Place HD cath - d/w family - they are ok wit 1-2 sessions of HD if required   Endocrine: H/o IDDM BG goal 140-180mg /dl Adrenal insuff, pred dep -presume AI - hydrocort drop to 50 q 12 now that off pressors , resume oral pred eventually  GI : Ct TFs ppi  Heme: Acute blood loss anemia - 1 U PRBC today, goal Hb 8 & above Per Ortho note ok for eliquis eventually, SQ heparin now since renal failure  Family - updated 2 sons in detail - they understand guarded prognosis, agree to no further resuscitation(would like to discuss with pts girlfriend before formalising )  The patient is critically ill with multiple organ systems failure and requires high complexity decision making for assessment and support, frequent  evaluation and titration of therapies, application of advanced monitoring technologies and extensive interpretation of multiple databases. Critical Care Time devoted to patient care services described in this note independent of APP time is 35 minutes.    Cyril Mourning MD. Tonny Bollman. [place] [facility] Pager [phone] If no response call [phone]   09/22/2017

## 2017-09-22 NOTE — Progress Notes (Addendum)
Progress Note  Patient Name: Adam Weeks. Date of Encounter: 09/22/2017  Primary Cardiologist: Dr. Oval Linsey (new)  Subjective   Intubated and sedated.  Unable to obtain.   Inpatient Medications    Scheduled Meds: . allopurinol  100 mg Per Tube Daily  . aspirin  81 mg Per Tube BID  . atorvastatin  40 mg Per Tube Daily  . chlorhexidine gluconate (MEDLINE KIT)  15 mL Mouth Rinse BID  . Chlorhexidine Gluconate Cloth  6 each Topical Q0600  . cholecalciferol  1,000 Units Oral Daily  . docusate  100 mg Per Tube BID  . feeding supplement (PRO-STAT SUGAR FREE 64)  30 mL Per Tube Daily  . hydrocortisone sodium succinate  50 mg Intravenous Q6H  . insulin aspart  2-6 Units Subcutaneous Q4H  . mouth rinse  15 mL Mouth Rinse QID  . multivitamin  2 tablet Oral BID  . mupirocin ointment  1 application Nasal BID  . pantoprazole sodium  40 mg Per Tube BID   Continuous Infusions: . dexmedetomidine (PRECEDEX) IV infusion 0.4 mcg/kg/hr (09/21/17 1936)  . feeding supplement (VITAL AF 1.2 CAL) 1,000 mL (09/21/17 1707)  . fentaNYL infusion INTRAVENOUS 175 mcg/hr (09/21/17 1937)  . heparin 1,050 Units/hr (09/21/17 0930)  . norepinephrine (LEVOPHED) Adult infusion 1 mcg/min (09/22/17 0417)   PRN Meds: acetaminophen **OR** acetaminophen, docusate, HYDROcodone-acetaminophen, lidocaine, menthol-cetylpyridinium **OR** phenol, ondansetron **OR** ondansetron (ZOFRAN) IV   Vital Signs    Vitals:   09/22/17 0500 09/22/17 0600 09/22/17 0730 09/22/17 0751  BP: (!) 84/47 (!) 85/51    Pulse: 69 65    Resp: 15 15    Temp:   98.7 F (37.1 C)   TempSrc:   Oral   SpO2: 100% 100%  100%  Weight:        Intake/Output Summary (Last 24 hours) at 09/22/17 0756 Last data filed at 09/22/17 0600  Gross per 24 hour  Intake          2268.26 ml  Output              162 ml  Net          2106.26 ml   Filed Weights   09/22/2017 2000 09/02/2017 0457  Weight: 75.4 kg (166 lb 3.6 oz) 77 kg (169 lb 12.1 oz)     Telemetry    Sinus rhythm.  PACs.  4 beats of NSVT. - Personally Reviewed  ECG    N/a - Personally Reviewed  Physical Exam   VS:  BP (!) 85/51   Pulse 65   Temp 98.7 F (37.1 C) (Oral)   Resp 15   Wt 77 kg (169 lb 12.1 oz)   SpO2 100%   BMI 24.01 kg/m  , BMI Body mass index is 24.01 kg/m. GENERAL:  Intubated and sedated. HEENT: Pupils equal round and reactive, fundi not visualized, oral mucosa unremarkable NECK:  +JVD.  waveform within normal limits, carotid upstroke brisk and symmetric, no bruits, LUNGS:  Clear to auscultation bilaterally on anterior exam. HEART:  RRR.  PMI not displaced or sustained,S1 and S2 within normal limits, no S3, no S4, no clicks, no rubs, no murmurs ABD:  Flat, positive bowel sounds normal in frequency in pitch, no bruits, no rebound, no guarding, no midline pulsatile mass, no hepatomegaly, no splenomegaly EXT:  2 plus pulses throughout, no edema, no cyanosis no clubbing SKIN:  No rashes no nodules NEURO:  Unable to assess.  Intubated and sedated. PSYCH:  Unable  to assess.  Intubated and sedated.    Labs    Chemistry Recent Labs Lab 09/07/2017 2002  09/21/2017 1416 09/20/17 0219 09/21/17 0519  NA 140  < > 141 137 133*  K 5.0  < > 4.6 4.5 4.9  CL 106  < > 111 108 102  CO2 18*  --  20* 16* 19*  GLUCOSE 133*  < > 113* 119* 133*  BUN 42*  < > 38* 43* 64*  CREATININE 1.67*  < > 1.80* 2.49* 3.87*  CALCIUM 8.8*  --  7.8* 7.5* 7.7*  PROT 6.5  --   --   --   --   ALBUMIN 3.5  --   --   --   --   AST 25  --   --   --   --   ALT 19  --   --   --   --   ALKPHOS 59  --   --   --   --   BILITOT 0.8  --   --   --   --   GFRNONAA 35*  --  32* 22* 13*  GFRAA 41*  --  37* 25* 15*  ANIONGAP 16*  --  10 13 12   < > = values in this interval not displayed.   Hematology  Recent Labs Lab 09/20/17 0219 09/21/17 0519 09/22/17 0503  WBC 24.4* 22.5* 15.7*  RBC 3.12* 2.61* 2.21*  HGB 9.9* 8.3* 7.1*  HCT 31.5* 25.5* 21.9*  MCV 101.0* 97.7 99.1   MCH 31.7 31.8 32.1  MCHC 31.4 32.5 32.4  RDW 14.0 14.1 14.0  PLT 100* 95* 100*    Cardiac Enzymes  Recent Labs Lab 09/20/17 0908 09/21/17 0950  TROPONINI 1.88* 0.48*     Recent Labs Lab 09/24/2017 2006  TROPIPOC 0.04     BNPNo results for input(s): BNP, PROBNP in the last 168 hours.   DDimer No results for input(s): DDIMER in the last 168 hours.   Radiology    Dg Chest Port 1 View  Result Date: 09/21/2017 CLINICAL DATA:  Cardiac arrest. EXAM: PORTABLE CHEST 1 VIEW COMPARISON:  09/16/2017 FINDINGS: Patient rotated left. Endotracheal tube terminates 4.0 cm above carina.Nasogastric tube extends beyond the inferior aspect of the film. Prior median sternotomy. Cardiomegaly accentuated by AP portable technique. Probable small left pleural effusion. No pneumothorax. Mild interstitial edema, with decreased lung volumes. Worsened left and developing right base airspace disease. Skin fold over the right hemithorax. IMPRESSION: Overall worsened aeration, with decreased lung volumes and development of mild pulmonary venous congestion. Progressive left and developing right base airspace disease. Probable small left pleural effusion. Electronically Signed   By: Abigail Miyamoto M.D.   On: 09/21/2017 06:43    Cardiac Studies   Echo 09/20/17: Study Conclusions  - Left ventricle: The cavity size was normal. There was moderate   concentric hypertrophy. Systolic function was normal. The   estimated ejection fraction was in the range of 55% to 60%. There   was dynamic obstruction. There was dynamic obstruction at rest,   with a peak velocity of 110 cm/sec and a peak gradient of 5 mm   Hg. Doppler parameters are consistent with abnormal left   ventricular relaxation (grade 1 diastolic dysfunction). - Aortic valve: A bioprosthesis was present. There was mild   stenosis. Peak velocity (S): 302 cm/s. Mean gradient (S): 18 mm   Hg. Valve area (VTI): 1.52 cm^2. Valve area (Vmax): 1.13 cm^2.   Valve  area (  Vmean): 1.23 cm^2. - Mitral valve: Mildly calcified annulus. - Right ventricle: The cavity size was normal. Wall thickness was   normal. Systolic function was normal. - Tricuspid valve: There was moderate regurgitation.  Patient Profile     Mr. Lazalde is an 59M with with CAD s/p CABG, severe aortic stenosis s/p AVR, CKD 3, and hypertension here with hip fracture after he slipped and fell washing his car.  Assessment & Plan    # Paroxysmal atrial fibrillation: # Bradycardia: Mr. Cohen went into atrial fibrillation post-operatively and developed profound bradycardia after receiving IV beta blocker.  9/23 he was started on amiodarone but his heart rate decreased to the 60s and he became hypotensive so it was stopped.  He has remained in sinus rhythm over the last 48 hours. He has frequent PACs and had a 4 beat run of NSVT.  Will plan for a 30 day event monitor at discharge if he doesn't have any more episodes of bradycardia in the hospital.  Plan to continue heparin for 48 hours, which would be noon today.  # CAD s/p CABG:  # Elevated troponin:  Troponin was elevated to 1.8-->0.48.  It is unclear if ischemia may have contributed to his initially bradycardia and arrest.  We will likely plan for stress once stable.  Avoid cath unless absolutely necessary given his renal dysfunction. Continue aspirin and atorvastatin.  No beta blocker as above.  Systolic function was normal on echo.   # s/p bioprosthetic AVR: Valve functioning properly on echo.  Mean gradient 18 mmHg.  # Acute on chronic renal failure:  Creatinine continues to rise is essentially anuric. Would consult nephrology. He has increased edema on chest x-ray. It seems reasonable to give a trial of IV Lasix.  Time spent: 40 minutes-Greater than 50% of this time was spent in counseling, explanation of diagnosis, planning of further management, and coordination of care.   For questions or updates, please contact [place] Please  consult [email or web address] for contact info under Cardiology/STEMI.      Signed, Skeet Latch, MD  09/22/2017, 7:56 AM

## 2017-09-22 NOTE — Progress Notes (Signed)
PT Cancellation Note  Patient Details Name: Adam Weeks. MRN: [id number] DOB: 1927/04/30   Cancelled Treatment:    Reason Eval/Treat Not Completed: Medical issues which prohibited therapy Remains intubated on full support and sedated. Will follow.   [name] 09/22/2017, 1:24 PM Mylo Red, PT, DPT [phone]

## 2017-09-22 NOTE — Progress Notes (Signed)
CSW reached out to pt's son Lorin Picket for assessment. Scott informed CSW that he is a Education officer, community and is going to be seeing pt's today. Scott asked to call CSW back later today and CSW was agreeable. CSW will continue to assist with assessment.     Claude Manges Peggie Hornak, MSW, LCSW-A Emergency Department Clinical Social Worker [phone]

## 2017-09-22 NOTE — Progress Notes (Signed)
RT note-Placed back to full support for line placement.

## 2017-09-22 NOTE — Procedures (Signed)
Hemodialysis Catheter Insertion Procedure Note Adam Weeks [id number] 12-07-1927  Procedure: Insertion of Hemodialysis Catheter Indications: Hemodialysis  Procedure Details Consent: Risks of procedure as well as the alternatives and risks of each were explained to the (patient/caregiver).  Consent for procedure obtained.  Time Out: Verified patient identification, verified procedure, site/side was marked, verified correct patient position, special equipment/implants available, medications/allergies/relevent history reviewed, required imaging and test results available.  Performed  Maximum sterile technique was used including antiseptics, cap, gloves, gown, hand hygiene, mask and sheet.  Skin prep: Chlorhexidine; local anesthetic administered  A Trialysis HD catheter was placed in the right internal jugular vein using the Seldinger technique.  Evaluation Blood flow good Complications: No apparent complications Patient did tolerate procedure well. Chest X-ray ordered to verify placement.  CXR: reviewed, line in good position.  No pneumothorax.    Procedure performed under direct supervision of Dr. Vassie Loll and with ultrasound guidance for real time vessel cannulation.     Canary Brim, NP-C [facility] Pgr: [phone] or [phone] 09/22/2017, 4:26 PM

## 2017-09-22 NOTE — Progress Notes (Signed)
*  PRELIMINARY RESULTS* Vascular Ultrasound LE venous duplex has been completed.  Preliminary findings: No obvious evidence of DVT or baker's cyst.    Farrel Demark, RDMS, RVT  09/22/2017, 1:34 PM

## 2017-09-23 ENCOUNTER — Inpatient Hospital Stay (HOSPITAL_COMMUNITY): Payer: Medicare Other

## 2017-09-23 ENCOUNTER — Encounter (HOSPITAL_COMMUNITY): Payer: Self-pay | Admitting: *Deleted

## 2017-09-23 DIAGNOSIS — S72001A Fracture of unspecified part of neck of right femur, initial encounter for closed fracture: Principal | ICD-10-CM

## 2017-09-23 LAB — BPAM RBC
BLOOD PRODUCT EXPIRATION DATE: 201810022359
ISSUE DATE / TIME: 201809251453
Unit Type and Rh: 6200

## 2017-09-23 LAB — CBC
HEMATOCRIT: 22.9 % — AB (ref 39.0–52.0)
Hemoglobin: 7.6 g/dL — ABNORMAL LOW (ref 13.0–17.0)
MCH: 31.9 pg (ref 26.0–34.0)
MCHC: 33.2 g/dL (ref 30.0–36.0)
MCV: 96.2 fL (ref 78.0–100.0)
PLATELETS: 129 10*3/uL — AB (ref 150–400)
RBC: 2.38 MIL/uL — AB (ref 4.22–5.81)
RDW: 15.9 % — AB (ref 11.5–15.5)
WBC: 11.5 10*3/uL — AB (ref 4.0–10.5)

## 2017-09-23 LAB — GLUCOSE, CAPILLARY
GLUCOSE-CAPILLARY: 106 mg/dL — AB (ref 65–99)
GLUCOSE-CAPILLARY: 110 mg/dL — AB (ref 65–99)
GLUCOSE-CAPILLARY: 118 mg/dL — AB (ref 65–99)
GLUCOSE-CAPILLARY: 185 mg/dL — AB (ref 65–99)
Glucose-Capillary: 121 mg/dL — ABNORMAL HIGH (ref 65–99)
Glucose-Capillary: 140 mg/dL — ABNORMAL HIGH (ref 65–99)
Glucose-Capillary: 168 mg/dL — ABNORMAL HIGH (ref 65–99)

## 2017-09-23 LAB — BASIC METABOLIC PANEL
Anion gap: 17 — ABNORMAL HIGH (ref 5–15)
BUN: 104 mg/dL — ABNORMAL HIGH (ref 6–20)
CALCIUM: 7.2 mg/dL — AB (ref 8.9–10.3)
CHLORIDE: 100 mmol/L — AB (ref 101–111)
CO2: 19 mmol/L — ABNORMAL LOW (ref 22–32)
Creatinine, Ser: 5.68 mg/dL — ABNORMAL HIGH (ref 0.61–1.24)
GFR, EST AFRICAN AMERICAN: 9 mL/min — AB (ref >60)
GFR, EST NON AFRICAN AMERICAN: 8 mL/min — AB (ref >60)
Glucose, Bld: 148 mg/dL — ABNORMAL HIGH (ref 65–99)
Potassium: 4.7 mmol/L (ref 3.5–5.1)
SODIUM: 136 mmol/L (ref 135–145)

## 2017-09-23 LAB — TYPE AND SCREEN
ABO/RH(D): A POS
ANTIBODY SCREEN: NEGATIVE
UNIT DIVISION: 0

## 2017-09-23 MED ORDER — PANTOPRAZOLE SODIUM 40 MG PO TBEC
40.0000 mg | DELAYED_RELEASE_TABLET | Freq: Two times a day (BID) | ORAL | Status: DC
  Administered 2017-09-23 – 2017-09-27 (×8): 40 mg via ORAL
  Filled 2017-09-23 (×8): qty 1

## 2017-09-23 MED ORDER — FENTANYL CITRATE (PF) 100 MCG/2ML IJ SOLN
25.0000 ug | INTRAMUSCULAR | Status: DC | PRN
  Administered 2017-09-23: 50 ug via INTRAVENOUS
  Filled 2017-09-23: qty 2

## 2017-09-23 MED ORDER — ATORVASTATIN CALCIUM 40 MG PO TABS
40.0000 mg | ORAL_TABLET | Freq: Every day | ORAL | Status: DC
  Administered 2017-09-24 – 2017-09-27 (×4): 40 mg via ORAL
  Filled 2017-09-23 (×4): qty 1

## 2017-09-23 MED ORDER — FENTANYL CITRATE (PF) 100 MCG/2ML IJ SOLN
50.0000 ug | INTRAMUSCULAR | Status: DC | PRN
  Administered 2017-09-23 (×2): 75 ug via INTRAVENOUS
  Administered 2017-09-23: 50 ug via INTRAVENOUS
  Administered 2017-09-23 – 2017-09-25 (×16): 75 ug via INTRAVENOUS
  Administered 2017-09-26 (×3): 50 ug via INTRAVENOUS
  Filled 2017-09-23 (×22): qty 2

## 2017-09-23 MED ORDER — DOCUSATE SODIUM 50 MG/5ML PO LIQD
100.0000 mg | Freq: Two times a day (BID) | ORAL | Status: DC
  Administered 2017-09-23: 100 mg via ORAL
  Filled 2017-09-23: qty 10

## 2017-09-23 MED ORDER — CARVEDILOL 6.25 MG PO TABS
6.2500 mg | ORAL_TABLET | Freq: Two times a day (BID) | ORAL | Status: DC
  Administered 2017-09-23 – 2017-09-24 (×2): 6.25 mg via ORAL
  Filled 2017-09-23 (×2): qty 1

## 2017-09-23 MED ORDER — ALLOPURINOL 100 MG PO TABS
100.0000 mg | ORAL_TABLET | Freq: Every day | ORAL | Status: DC
  Administered 2017-09-24 – 2017-09-27 (×4): 100 mg via ORAL
  Filled 2017-09-23 (×4): qty 1

## 2017-09-23 MED ORDER — ASPIRIN 81 MG PO CHEW
81.0000 mg | CHEWABLE_TABLET | Freq: Two times a day (BID) | ORAL | Status: DC
  Administered 2017-09-23 – 2017-09-27 (×8): 81 mg via ORAL
  Filled 2017-09-23 (×8): qty 1

## 2017-09-23 NOTE — Progress Notes (Signed)
Orthopaedic Trauma Progress Note  S: Still intubated. Following commands  O:  Vitals:   09/23/17 0900 09/23/17 1000  BP: (!) 129/56 (!) 145/70  Pulse: 72 76  Resp: 13 16  Temp:    SpO2: 100% 100%  RLE: Dressing intact, no significant swelling about hip. Gentle ROM without discomfort. Moves foot to commands  Labs: Hgb 7.6  A/P: POD4 s/p right hip hemi  -WBAT, no hip ROM restrictions -Will continue to follow   Roby Lofts, MD [facility] [phone] (phone)

## 2017-09-23 NOTE — Clinical Social Work Note (Signed)
Clinical Social Work Assessment  Patient Details  Name: Adam Weeks. MRN: [id number] Date of Birth: 05/29/1927  Date of referral:  09/23/17               Reason for consult:  Facility Placement, Discharge Planning, Emotional/Coping/Adjustment to Illness                Permission sought to share information with:  Facility Medical sales representative, Family Supports Permission granted to share information::  Yes, Verbal Permission Granted  Name::        Agency::     Relationship::  Son Acupuncturist and his wife at bedside  Contact Information:     Housing/Transportation Living arrangements for the past 2 months:  Single Family Home Source of Information:  Medical Team, Adult Children Patient Interpreter Needed:  None Criminal Activity/Legal Involvement Pertinent to Current Situation/Hospitalization:  No - Comment as needed Significant Relationships:  Adult Children, Other Family Members, Community Support, Friend, Network engineer, Significant Other Lives with:  Self Do you feel safe going back to the place where you live?  No Need for family participation in patient care:  Yes (Comment) (POA scott)  Care giving concerns:  Patient admitted after a fall when he was washing his car causing a hip fx.  Patient lives alone, has a significant other and adult children involved in care and support.  Family is concerned with patient returning home as he was just extubated and will need around the clock care at discharge, along with physical therapy to assist with hip. Patient has never done rehab in the past.  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 Patient prior to admission still drives, lives independently and uses a walker (not all the time as he should, but most of the time). Daughter in law reports patient has chronic back pain and takes multiple medications for pain in which the [place] supplies and provides.  Son reports family and patient aware of need for rehab and in agreement with beginning  process.   Social Worker assessment / plan:  Assessment completed along with consult for discharge planning, SNF placement.  Family at bedside and assessment completed with POA Scott and his wife.  Family interested in [facility] or possibly [facility] due to location and ability for his SO to come and visit and support.  LCSW explained process, PT recommendations, and follow up with bed offers related to SNF placement. Role and education provided for son about CSW follow up with bed offers.  All voice understanding.  Patient laying in bed, awake and alert and doing well after being extubated today. He is in agreement with SNF placement as well.  Plan: SNF work up completed Bed offers to be followed up. PT note pending.  Employment status:  Retired Health and safety inspector:  Medicare, [employer] Benefit PT Recommendations:   pending order placed. Information / Referral to community resources:  Skilled Nursing Facility  Patient/Family's Response to care:  Family voice understanding and allowed time to ask questions and review next steps.    Patient/Family's Understanding of and Emotional Response to Diagnosis, Current Treatment, and Prognosis:  Son reports he cannot remember which day it is as him and his wife just came back from vacation the day before this event happened.  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 He voices his appreciation of time spent and care  for his father. He is aware of needs for ST rehab and agreeable to plan.  Emotional Assessment Appearance:  Appears stated age Attitude/Demeanor/Rapport:    Affect (typically observed):  Accepting, Adaptable, Pleasant Orientation:  Oriented to Self, Oriented to Place, Oriented to Situation Alcohol / Substance use:  Not Applicable Psych involvement (Current and /or in the community):  Yes (Comment), Outpatient Provider (Depression  (Effexor))  Discharge Needs  Concerns to be addressed:  Care Coordination, Discharge Planning Concerns Readmission within the last 30 days:  No Current discharge risk:  None Barriers to Discharge:  Continued Medical Work up   Cordella Register 09/23/2017, 6:44 PM

## 2017-09-23 NOTE — Procedures (Signed)
Extubation Procedure Note  Patient Details:   Name: Adam Weeks. DOB: 11/20/1927 MRN: [id number]   Airway Documentation:     Evaluation  O2 sats: stable throughout Complications: No apparent complications Patient did tolerate procedure well. Bilateral Breath Sounds: Diminished   Yes   Pt extubated to 3L [place] per MD Order. Pt stable throughout with no complications. Pt able to speak and has a weak non productive cough post extubation. Pt encouraged to use [place] to clear secretions. RT will continue to monitor.   Ermalinda Barrios M 09/23/2017, 12:08 PM

## 2017-09-23 NOTE — NC FL2 (Signed)
[place] MEDICAID FL2 LEVEL OF CARE SCREENING TOOL     IDENTIFICATION  Patient Name: Adam Weeks. Birthdate: 1927-12-22 Sex: male Admission Date (Current Location): 08/29/2017  [facility] and [place] Number:  Producer, television/film/video and Address:  [facility] [place]. [facility], [address], [place], [place] [address]      Provider Number: [id number]  Attending Physician Name and Address:  Nelda Bucks, MD  Relative Name and Phone Number:   Lorin Picket (son)  [phone]    Current Level of Care: Hospital Recommended Level of Care: Skilled Nursing Facility Prior Approval Number:    Date Approved/Denied:   PASRR Number:   [id number]  Discharge Plan: SNF    Current Diagnoses: Patient Active Problem List   Diagnosis Date Noted  . Acute respiratory failure (HCC)   . Aphasia   . Bradycardia   . Cardiac arrest (HCC)   . S/P CABG (coronary artery bypass graft)   . HTN (hypertension) 09/09/2017  . Syncope and collapse   . Closed right hip fracture, initial encounter (HCC) 09/11/2017  . Current chronic use of systemic steroids 08/27/2017  . Chronic back pain 08/27/2017  . Renal cyst 05/27/2017  . Cellulitis 04/24/2017  . Pain and swelling of lower leg, left 04/13/2017  . Right-sided chest wall pain 04/13/2017  . Dyspnea 01/23/2017  . Memory loss 03/15/2015  . History of gout 09/28/2014  . Involuntary movements 09/28/2014  . Orthostasis 09/28/2014  . Hypertensive renal disease with renal failure 06/16/2014  . PMR (polymyalgia rheumatica) (HCC) 06/16/2014  . CKD (chronic kidney disease) stage 3, GFR 30-59 ml/min 06/04/2014  . Elevated sed rate 06/04/2014  . TIA (transient ischemic attack) 06/03/2014  . Hypomagnesemia 06/03/2014  . Metabolic alkalosis 06/03/2014  . Dehydration 06/03/2014  . Generalized weakness 06/03/2014  . Well adult exam 12/27/2013  . Fatigue 12/27/2013  . Peripheral edema 12/09/2013  . Arthritis of right shoulder region 12/09/2013  .  Right shoulder pain 12/09/2013  . Foot swelling 04/23/2013  . Anemia 04/23/2013  . Hyperuricemia 04/23/2013  . Diverticulitis 10/08/2012  . Contusion, flank 10/08/2012  . Gall stones 10/01/2012  . Constipation 09/24/2012  . CAD (coronary artery disease) 09/23/2011  . S/P AVR 09/23/2011  . Low back pain 09/23/2011  . Dyslipidemia 09/23/2011  . Osteopenia 09/23/2011  . Preventative health care 09/21/2011    Orientation RESPIRATION BLADDER Height & Weight     Self, Situation, Place  O2 (weaning O2, currently on 4L 09/23/17) Continent, Indwelling catheter Weight: 189 lb 13.1 oz (86.1 kg) Height:  6' (182.9 cm)  BEHAVIORAL SYMPTOMS/MOOD NEUROLOGICAL BOWEL NUTRITION STATUS      Continent Diet (See DC summary)  AMBULATORY STATUS COMMUNICATION OF NEEDS Skin   Extensive Assist Verbally Surgical wounds, Skin abrasions, Bruising                       Personal Care Assistance Level of Assistance  Bathing, Feeding, Dressing Bathing Assistance: Maximum assistance Feeding assistance: Limited assistance Dressing Assistance: Maximum assistance     Functional Limitations Info  Sight, Hearing, Speech Sight Info: Adequate Hearing Info: Adequate Speech Info: Adequate    SPECIAL CARE FACTORS FREQUENCY  PT (By licensed PT), OT (By licensed OT)     PT Frequency: 5x OT Frequency: 5x            Contractures Contractures Info: Not present    Additional Factors Info  Code Status, Allergies, Isolation Precautions Code Status Info: Partial Code Allergies  Info: Gabapentin     Isolation Precautions Info: on contact: MRSA     Current Medications (09/23/2017):  This is the current hospital active medication list Current Facility-Administered Medications  Medication Dose Route Frequency Provider Last Rate Last Dose  . 0.9 %  sodium chloride infusion   Intravenous Once Oretha Milch, MD      . acetaminophen (TYLENOL) tablet 650 mg  650 mg Oral Q6H PRN Montez Morita, PA-C       Or  .  acetaminophen (TYLENOL) suppository 650 mg  650 mg Rectal Q6H PRN Montez Morita, PA-C      . allopurinol (ZYLOPRIM) tablet 100 mg  100 mg Per Tube Daily Scatliffe, Gypsy Balsam, MD   100 mg at 09/23/17 1000  . aspirin chewable tablet 81 mg  81 mg Per Tube BID Scatliffe, Gypsy Balsam, MD   81 mg at 09/23/17 0924  . atorvastatin (LIPITOR) tablet 40 mg  40 mg Per Tube Daily Scatliffe, Gypsy Balsam, MD   40 mg at 09/23/17 0924  . carvedilol (COREG) tablet 6.25 mg  6.25 mg Oral BID WC Leslye Peer, MD   6.25 mg at 09/23/17 1727  . cholecalciferol (VITAMIN D) tablet 1,000 Units  1,000 Units Oral Daily Scatliffe, Gypsy Balsam, MD   1,000 Units at 09/23/17 0925  . docusate (COLACE) 50 MG/5ML liquid 100 mg  100 mg Per Tube BID Nelda Bucks, MD   100 mg at 09/23/17 1610  . fentaNYL (SUBLIMAZE) injection 50-75 mcg  50-75 mcg Intravenous Q2H PRN Leslye Peer, MD   75 mcg at 09/23/17 1729  . hydrocortisone sodium succinate (SOLU-CORTEF) 100 MG injection 50 mg  50 mg Intravenous Q12H Oretha Milch, MD   50 mg at 09/23/17 0741  . insulin aspart (novoLOG) injection 2-6 Units  2-6 Units Subcutaneous Q4H Scatliffe, Gypsy Balsam, MD   2 Units at 09/23/17 1627  . lidocaine (LIDODERM) 5 % 1 patch  1 patch Transdermal Daily PRN Hillary Bow, DO      . menthol-cetylpyridinium (CEPACOL) lozenge 3 mg  1 lozenge Oral PRN Montez Morita, PA-C       Or  . phenol (CHLORASEPTIC) mouth spray 1 spray  1 spray Mouth/Throat PRN Montez Morita, PA-C      . multivitamin (PROSIGHT) tablet 2 tablet  2 tablet Oral BID Hillary Bow, DO   2 tablet at 09/23/17 9604  . mupirocin ointment (BACTROBAN) 2 % 1 application  1 application Nasal BID Rodolph Bong, MD   1 application at 09/23/17 [phone]  . ondansetron (ZOFRAN) tablet 4 mg  4 mg Oral Q6H PRN Montez Morita, PA-C       Or  . ondansetron [facility]) injection 4 mg  4 mg Intravenous Q6H PRN Montez Morita, PA-C      . pantoprazole sodium (PROTONIX) 40 mg/20 mL oral suspension 40 mg  40 mg Per  Tube BID Nelda Bucks, MD   40 mg at 09/23/17 8119     Discharge Medications: Please see discharge summary for a list of discharge medications.  Relevant Imaging Results:  Relevant Lab Results:   Additional Information SSN: [id number]  Raye Sorrow, [place]

## 2017-09-23 NOTE — Consult Note (Signed)
[facility] Renal Consultation Note  Requesting MD: Titus Mould Indication for Consultation: A on CRF  HPI:  Tyree Fluharty. is a 83 y.o. male with past medical history significant for hypertension, coronary artery disease status post CABG, status post AVR, TIAs. He also has CKD with baseline creatinine around 1.5.  Patient was brought to the emergency department on 9/21 just before midnight. He was found down in driveway, had aphasia and a broken hip as well as a possible stroke.  He underwent hip repair on 9/22.  Neurology saw and ruled out an acute stroke. This hospitalization was, complicated by A. fib with RVR, beta blocker caused bradycardia and a bradycardic cardiac arrest - this occurred in the evening of 9/22.  Since then, patient ventilated, transient pressors and also patient has developed acute on chronic renal failure. Creatinine 1.8 on 9/22 and is 5.7 today- has had only between 150 and 200 mL of urine output the last 3 days- but possibly picking up today? He is not on any pressors at present.  Urinalysis from 9/23 has lots of blood but minimal protein.  Temporary hemodialysis catheter has been placed by CCM. According to them, family would consent to temporary dialysis but not long-term dialysis- are also strongly considering DNR.  Patient is now extubated- he is having pain in his chest and in his jaw and is pretty clearly expressing that he does not want further resuscitative efforts.   Creatinine, Ser  Date/Time Value Ref Range Status  09/23/2017 05:48 AM 5.68 (H) 0.61 - 1.24 mg/dL Final  09/22/2017 06:55 PM 5.35 (H) 0.61 - 1.24 mg/dL Final  09/22/2017 07:30 AM 4.83 (H) 0.61 - 1.24 mg/dL Final  09/21/2017 05:19 AM 3.87 (H) 0.61 - 1.24 mg/dL Final    Comment:    REPEATED TO VERIFY  09/20/2017 02:19 AM 2.49 (H) 0.61 - 1.24 mg/dL Final  09/17/2017 02:16 PM 1.80 (H) 0.61 - 1.24 mg/dL Final  09/12/2017 08:07 PM 1.60 (H) 0.61 - 1.24 mg/dL Final  09/08/2017 08:02 PM 1.67  (H) 0.61 - 1.24 mg/dL Final  08/27/2017 05:30 PM 1.69 (H) 0.40 - 1.50 mg/dL Final  04/13/2017 03:52 PM 1.71 (H) 0.40 - 1.50 mg/dL Final  09/17/2016 03:11 PM 1.68 (H) 0.40 - 1.50 mg/dL Final  11/30/2015 03:12 PM 1.57 (H) 0.40 - 1.50 mg/dL Final  09/28/2014 02:07 PM 1.5 0.4 - 1.5 mg/dL Final  08/21/2014 04:00 PM 1.4 0.4 - 1.5 mg/dL Final  06/16/2014 04:52 PM 1.2 0.4 - 1.5 mg/dL Final  06/03/2014 07:50 AM 1.47 (H) 0.50 - 1.35 mg/dL Final  12/09/2013 04:22 PM 1.6 (H) 0.4 - 1.5 mg/dL Final  09/05/2013 03:36 PM 1.4 0.4 - 1.5 mg/dL Final  04/11/2013 04:15 PM 1.7 (H) 0.4 - 1.5 mg/dL Final  09/28/2012 07:42 PM 1.47 (H) 0.50 - 1.35 mg/dL Final  02/18/2012 12:17 PM 1.7 (H) 0.4 - 1.5 mg/dL Final  11/23/2009 09:03 AM 1.87 (H) 0.4 - 1.5 mg/dL Final  11/22/2009 05:30 AM 1.97 (H) 0.4 - 1.5 mg/dL Final  11/21/2009 08:20 AM 1.99 (H) 0.4 - 1.5 mg/dL Final  11/20/2009 05:25 AM 1.45 0.4 - 1.5 mg/dL Final  11/19/2009 02:52 PM 1.7 (H) 0.4 - 1.5 mg/dL Final     PMHx:   Past Medical History:  Diagnosis Date  . CAD (coronary artery disease)   . Chronic kidney disease    stones  . GERD (gastroesophageal reflux disease)   . History of gout 09/28/2014   2014  . Hyperlipidemia   .  Hypertension   . Osteoporosis   . Stroke [facility])    TIA    Past Surgical History:  Procedure Laterality Date  . CARDIAC VALVE REPLACEMENT  2009   pig valve -- AVR  . CORONARY ARTERY BYPASS GRAFT     2009  . HERNIA REPAIR    . HIP ARTHROPLASTY Right 09/23/2017   Procedure: ARTHROPLASTY BIPOLAR HIP (HEMIARTHROPLASTY);  Surgeon: Shona Needles, MD;  Location: [facility];  Service: Orthopedics;  Laterality: Right;    Family Hx:  Family History  Problem Relation Age of Onset  . Cancer Mother 69       breast ca  . Heart disease Father        CAD    Social History:  reports that he has quit smoking. He has never used smokeless tobacco. He reports that he does not drink alcohol or use drugs.  Allergies:  Allergies   Allergen Reactions  . Gabapentin     jumpy    Medications: Prior to Admission medications   Medication Sig Start Date End Date Taking? Authorizing Provider  acetaminophen (TYLENOL) 500 MG tablet Take 1,000 mg by mouth every 6 (six) hours as needed for mild pain.   Yes [provider]  allopurinol (ZYLOPRIM) 100 MG tablet Take 1 tablet (100 mg total) by mouth daily. 06/05/14  Yes Short, Noah Delaine, MD  aspirin 81 MG tablet Take 81 mg by mouth 2 (two) times daily.    Yes [provider]  atorvastatin (LIPITOR) 40 MG tablet Take 1 tablet (40 mg total) by mouth daily. 06/05/14  Yes Short, Noah Delaine, MD  carvedilol (COREG) 12.5 MG tablet Take 12.5 mg by mouth 2 (two) times daily with a meal.   Yes [provider]  Cholecalciferol 1000 UNITS tablet Take 1,000 Units by mouth daily.     Yes [provider]  docusate sodium (COLACE) 100 MG capsule Take 200 mg by mouth 2 (two) times daily.   Yes [provider]  finasteride (PROSCAR) 5 MG tablet Take 5 mg by mouth daily.   Yes [provider]  HYDROmorphone (DILAUDID) 4 MG tablet Take 0.5-1 tablets (2-4 mg total) by mouth every 6 (six) hours as needed for severe pain. 09/01/17  Yes Plotnikov, Evie Lacks, MD  lidocaine (LIDODERM) 5 % Place 1 patch onto the skin as needed (for pain). Remove & Discard patch within 12 hours or as directed by MD    Yes [provider]  Magnesium 200 MG TABS 1 po qd Patient taking differently: Take 1 each by mouth daily.  10/23/14  Yes Plotnikov, Evie Lacks, MD  Multiple Vitamins-Minerals (OCUVITE PRESERVISION) TABS Take 2 tablets by mouth 2 (two) times daily.     Yes [provider]  omeprazole (PRILOSEC) 20 MG capsule Take 20 mg by mouth 2 (two) times daily before a meal.    Yes [provider]  predniSONE (DELTASONE) 10 MG tablet 1 po qd Patient taking differently: Take 10 mg by mouth daily with breakfast.  09/01/17  Yes Plotnikov, Evie Lacks, MD   torsemide (DEMADEX) 20 MG tablet Take 1-2 tablets (20-40 mg total) by mouth daily. Patient taking differently: Take 20 mg by mouth daily.  04/24/17  Yes Plotnikov, Evie Lacks, MD  venlafaxine XR (EFFEXOR-XR) 75 MG 24 hr capsule Take 225 mg by mouth daily with breakfast.   Yes [provider]    I have reviewed the patient's current medications.  Labs:  Results for orders placed or performed during  the hospital encounter of 09/21/2017 (from the past 48 hour(s))  Glucose, capillary     Status: Abnormal   Collection Time: 09/21/17  4:03 PM  Result Value Ref Range   Glucose-Capillary 161 (H) 65 - 99 mg/dL   Comment 1 Capillary Specimen   Glucose, capillary     Status: Abnormal   Collection Time: 09/21/17  7:49 PM  Result Value Ref Range   Glucose-Capillary 145 (H) 65 - 99 mg/dL   Comment 1 Capillary Specimen    Comment 2 Notify RN   Glucose, capillary     Status: Abnormal   Collection Time: 09/21/17 11:29 PM  Result Value Ref Range   Glucose-Capillary 146 (H) 65 - 99 mg/dL   Comment 1 Capillary Specimen    Comment 2 Notify RN   Glucose, capillary     Status: Abnormal   Collection Time: 09/22/17  3:24 AM  Result Value Ref Range   Glucose-Capillary 164 (H) 65 - 99 mg/dL   Comment 1 Capillary Specimen    Comment 2 Notify RN   CBC     Status: Abnormal   Collection Time: 09/22/17  5:03 AM  Result Value Ref Range   WBC 15.7 (H) 4.0 - 10.5 K/uL   RBC 2.21 (L) 4.22 - 5.81 MIL/uL   Hemoglobin 7.1 (L) 13.0 - 17.0 g/dL   HCT 21.9 (L) 39.0 - 52.0 %   MCV 99.1 78.0 - 100.0 fL   MCH 32.1 26.0 - 34.0 pg   MCHC 32.4 30.0 - 36.0 g/dL   RDW 14.0 11.5 - 15.5 %   Platelets 100 (L) 150 - 400 K/uL    Comment: CONSISTENT WITH PREVIOUS RESULT  Heparin level (unfractionated)     Status: None   Collection Time: 09/22/17  5:03 AM  Result Value Ref Range   Heparin Unfractionated 0.44 0.30 - 0.70 IU/mL    Comment:        IF HEPARIN RESULTS ARE BELOW EXPECTED VALUES, AND PATIENT DOSAGE HAS  BEEN CONFIRMED, SUGGEST FOLLOW UP TESTING OF ANTITHROMBIN III LEVELS.   Magnesium     Status: Abnormal   Collection Time: 09/22/17  5:03 AM  Result Value Ref Range   Magnesium 2.5 (H) 1.7 - 2.4 mg/dL  Phosphorus     Status: Abnormal   Collection Time: 09/22/17  5:03 AM  Result Value Ref Range   Phosphorus 6.5 (H) 2.5 - 4.6 mg/dL  Glucose, capillary     Status: Abnormal   Collection Time: 09/22/17  7:25 AM  Result Value Ref Range   Glucose-Capillary 153 (H) 65 - 99 mg/dL   Comment 1 Capillary Specimen    Comment 2 Notify RN   Basic metabolic panel     Status: Abnormal   Collection Time: 09/22/17  7:30 AM  Result Value Ref Range   Sodium 134 (L) 135 - 145 mmol/L   Potassium 5.0 3.5 - 5.1 mmol/L   Chloride 104 101 - 111 mmol/L   CO2 18 (L) 22 - 32 mmol/L   Glucose, Bld 156 (H) 65 - 99 mg/dL   BUN 84 (H) 6 - 20 mg/dL   Creatinine, Ser 4.83 (H) 0.61 - 1.24 mg/dL   Calcium 7.2 (L) 8.9 - 10.3 mg/dL   GFR calc non Af Amer 10 (L) >60 mL/min   GFR calc Af Amer 11 (L) >60 mL/min    Comment: (NOTE) The eGFR has been calculated using the CKD EPI equation. This calculation has not been validated in all clinical situations.  eGFR's persistently <60 mL/min signify possible Chronic Kidney Disease.    Anion gap 12 5 - 15  Prepare RBC     Status: None   Collection Time: 09/22/17 11:42 AM  Result Value Ref Range   Order Confirmation ORDER PROCESSED BY BLOOD BANK   Glucose, capillary     Status: Abnormal   Collection Time: 09/22/17 11:42 AM  Result Value Ref Range   Glucose-Capillary 136 (H) 65 - 99 mg/dL   Comment 1 Capillary Specimen    Comment 2 Notify RN   Type and screen [place]     Status: None   Collection Time: 09/22/17 11:46 AM  Result Value Ref Range   ABO/RH(D) A POS    Antibody Screen NEG    Sample Expiration 09/25/2017    Unit Number [id number]    Blood Component Type RBC LR PHER1    Unit division 00    Status of Unit ISSUED,FINAL     Transfusion Status OK TO TRANSFUSE    Crossmatch Result Compatible   Glucose, capillary     Status: Abnormal   Collection Time: 09/22/17  3:19 PM  Result Value Ref Range   Glucose-Capillary 141 (H) 65 - 99 mg/dL   Comment 1 Capillary Specimen   Magnesium     Status: Abnormal   Collection Time: 09/22/17  6:55 PM  Result Value Ref Range   Magnesium 2.5 (H) 1.7 - 2.4 mg/dL  Phosphorus     Status: Abnormal   Collection Time: 09/22/17  6:55 PM  Result Value Ref Range   Phosphorus 6.4 (H) 2.5 - 4.6 mg/dL  Basic metabolic panel     Status: Abnormal   Collection Time: 09/22/17  6:55 PM  Result Value Ref Range   Sodium 134 (L) 135 - 145 mmol/L   Potassium 4.8 3.5 - 5.1 mmol/L   Chloride 102 101 - 111 mmol/L   CO2 20 (L) 22 - 32 mmol/L   Glucose, Bld 126 (H) 65 - 99 mg/dL   BUN 96 (H) 6 - 20 mg/dL   Creatinine, Ser 5.35 (H) 0.61 - 1.24 mg/dL   Calcium 7.3 (L) 8.9 - 10.3 mg/dL   GFR calc non Af Amer 9 (L) >60 mL/min   GFR calc Af Amer 10 (L) >60 mL/min    Comment: (NOTE) The eGFR has been calculated using the CKD EPI equation. This calculation has not been validated in all clinical situations. eGFR's persistently <60 mL/min signify possible Chronic Kidney Disease.    Anion gap 12 5 - 15  Glucose, capillary     Status: Abnormal   Collection Time: 09/22/17  7:50 PM  Result Value Ref Range   Glucose-Capillary 144 (H) 65 - 99 mg/dL   Comment 1 Notify RN   Hemoglobin and hematocrit, blood     Status: Abnormal   Collection Time: 09/22/17  9:28 PM  Result Value Ref Range   Hemoglobin 7.3 (L) 13.0 - 17.0 g/dL   HCT 22.3 (L) 39.0 - 52.0 %  Glucose, capillary     Status: Abnormal   Collection Time: 09/23/17 12:03 AM  Result Value Ref Range   Glucose-Capillary 168 (H) 65 - 99 mg/dL   Comment 1 Notify RN   Glucose, capillary     Status: Abnormal   Collection Time: 09/23/17  3:54 AM  Result Value Ref Range   Glucose-Capillary 185 (H) 65 - 99 mg/dL   Comment 1 Notify RN   CBC      Status:  Abnormal   Collection Time: 09/23/17  5:48 AM  Result Value Ref Range   WBC 11.5 (H) 4.0 - 10.5 K/uL   RBC 2.38 (L) 4.22 - 5.81 MIL/uL   Hemoglobin 7.6 (L) 13.0 - 17.0 g/dL    Comment: CONSISTENT WITH PREVIOUS RESULT   HCT 22.9 (L) 39.0 - 52.0 %   MCV 96.2 78.0 - 100.0 fL   MCH 31.9 26.0 - 34.0 pg   MCHC 33.2 30.0 - 36.0 g/dL   RDW 15.9 (H) 11.5 - 15.5 %   Platelets 129 (L) 150 - 400 K/uL    Comment: PLATELET COUNT CONFIRMED BY SMEAR  Basic metabolic panel     Status: Abnormal   Collection Time: 09/23/17  5:48 AM  Result Value Ref Range   Sodium 136 135 - 145 mmol/L   Potassium 4.7 3.5 - 5.1 mmol/L   Chloride 100 (L) 101 - 111 mmol/L   CO2 19 (L) 22 - 32 mmol/L   Glucose, Bld 148 (H) 65 - 99 mg/dL   BUN 104 (H) 6 - 20 mg/dL   Creatinine, Ser 5.68 (H) 0.61 - 1.24 mg/dL   Calcium 7.2 (L) 8.9 - 10.3 mg/dL   GFR calc non Af Amer 8 (L) >60 mL/min   GFR calc Af Amer 9 (L) >60 mL/min    Comment: (NOTE) The eGFR has been calculated using the CKD EPI equation. This calculation has not been validated in all clinical situations. eGFR's persistently <60 mL/min signify possible Chronic Kidney Disease.    Anion gap 17 (H) 5 - 15  Glucose, capillary     Status: Abnormal   Collection Time: 09/23/17  7:55 AM  Result Value Ref Range   Glucose-Capillary 121 (H) 65 - 99 mg/dL   Comment 1 Capillary Specimen   Glucose, capillary     Status: Abnormal   Collection Time: 09/23/17 11:32 AM  Result Value Ref Range   Glucose-Capillary 110 (H) 65 - 99 mg/dL   Comment 1 Capillary Specimen      ROS:  Review of systems not obtained due to patient factors.  Physical Exam: Vitals:   09/23/17 1200 09/23/17 1207  BP: (!) 180/71   Pulse: 84   Resp: (!) 21   Temp:    SpO2: 96% (!) 83%     General: Elderly male, frail, pain in chest with coughing. Does seem to be oriented HEENT: Pupils are equally round and reactive to light, extra motions are intact, mucous membranes moist Neck: No  obvious JVD Heart: Irregular with erratic rate Lungs: Coarse breath sounds bilaterally Abdomen: Obese, soft, nontender Extremities: Bruised and edematous Skin: Warm and dry Neuro: Oriented, nonfocal  Assessment/Plan: 83 year old white male with multiple medical issues status post hip surgery, A. fib, bradycardic cardiac arrest with temporary ICU management with vent/pressors with resultant A on chronic renal failure 1.Renal- acute on chronic renal failure. Baseline GFR not good in the 30s. Has worsened since cardiac arrest. BUN is now 104 but patient is not nauseated. According to the chart he is about 9 L positive and oliguric but was able to be extubated.  I discussed the risks of dialysis which do exist especially with cardiac issues. Patient seemed to be steering the conversation more to what would happen if I didn't do anything. He seems pretty set and he wants to be a DO NOT RESUSCITATE. Son and daughter-in-law in room do understand and want to abide by his wishes but they fear other family members wont  be as  accepting.  There is no absolute urgent need for dialysis. What I would hate to happen is for him to have a cardiac event on dialysis and not be resuscitated with not being able to discuss it with other family members. I would hope that the need for dialysis would be temporary but I can't be sure. I certainly would not want to continue this man on long-term dialysis and he would not want.  Since there is some unrest I will not do anything right now. I have communicated this conversation with CCM. They will continue with discussions      Leoncio Hansen A 09/23/2017, 1:26 PM

## 2017-09-23 NOTE — Progress Notes (Signed)
..   Name: Adam Weeks. MRN: [id number] DOB: [birth year]    ADMISSION DATE:  09/23/2017 CONSULTATION DATE: 10-05-17  REFERRING MD :  Janee Morn MD CHIEF COMPLAINT:    BRIEF PATIENT DESCRIPTION: 83 YR OLD MALE PRESENTED ON 09/16/2017 S/P FALL, a displaced right femoral neck fracture,  Right hip hemiarthroplasty. Noted to Afib RVR received Metoprolol 2.5mg  bradycardic s/p PEA arrest 9/22  SIGNIFICANT EVENTS  9/22 S/p PEA arrest  9/22  Right hemarthroplasty  STUDIES:  CTA Head and neck- 08/30/2017 MRI BRAIN 10/05/2017 neg BLE venous doppler 9/25>>> NEG   Tubes/lines:  ETT 9/22>>> R IJ HD cath 9/25>>>  SUBJECTIVE:  C/o hip pain. Weaning PS 10/5. HD cath placed yesterday.   VITAL SIGNS: Temp:  [98 F (36.7 C)-98.9 F (37.2 C)] 98.9 F (37.2 C) (09/26 0758) Pulse Rate:  [64-84] 67 (09/26 0825) Resp:  [9-17] 15 (09/26 0825) BP: (85-166)/(49-95) 139/62 (09/26 0825) SpO2:  [90 %-100 %] 100 % (09/26 0825) FiO2 (%):  [40 %] 40 % (09/26 0825) Weight:  [86.1 kg (189 lb 13.1 oz)] 86.1 kg (189 lb 13.1 oz) (09/26 0500)  PHYSICAL EXAMINATION: General:  Elderly male, NAD   Neuro:  Sedated, RASS -1, follows commands intermittently, nods appropriately  HEENT: ett, mm moist  PULM: coarse bases CV:  s1 s2 rrr  , no murmur GI: soft, BS wnl, no r Extremities: mild BLE edema, no rash    Recent Labs Lab 09/22/17 0730 09/22/17 1855 09/23/17 0548  NA 134* 134* 136  K 5.0 4.8 4.7  CL 104 102 100*  CO2 18* 20* 19*  BUN 84* 96* 104*  CREATININE 4.83* 5.35* 5.68*  GLUCOSE 156* 126* 148*    Recent Labs Lab 09/21/17 0519 09/22/17 0503 09/22/17 2128 09/23/17 0548  HGB 8.3* 7.1* 7.3* 7.6*  HCT 25.5* 21.9* 22.3* 22.9*  WBC 22.5* 15.7*  --  11.5*  PLT 95* 100*  --  129*   Dg Chest Port 1 View  Result Date: 09/23/2017 CLINICAL DATA:  83 year old male with acute respiratory failure. EXAM: PORTABLE CHEST 1 VIEW COMPARISON:  Nine hundred twenty-five FINDINGS: Endotracheal tube is  approximately 3 cm above the level of the carina. An enteric tube courses below the diaphragm beyond the edge of the film. A right-sided central venous catheter appears in stable position overlying the cavoatrial junction. The cardiomediastinal silhouette is enlarged with low lung volumes and vascular congestion. Note is made of a left pleural effusion associated atelectasis, with or without superimposed consolidation. No pneumothorax. Osseous structures grossly intact. IMPRESSION: 1. Stable appearance of the chest with life-support devices as detailed. 2. Low lung volumes and cardiomegaly with left pleural effusion and associated atelectasis, with or without superimposed consolidation. Electronically Signed   By: Sande Brothers M.D.   On: 09/23/2017 07:02   Dg Chest Port 1 View  Result Date: 09/22/2017 CLINICAL DATA:  Central line placement EXAM: PORTABLE CHEST 1 VIEW COMPARISON:  09/21/2017 FINDINGS: Post sternotomy changes. Endotracheal tube tip is about 3.9 cm superior to the carina. Esophageal tube tip is below the diaphragm. Right-sided central venous catheter tip overlies the cavoatrial region. No right pneumothorax. Hypoventilatory changes with bibasilar atelectasis or infiltrates. Stable enlarged cardiomediastinal silhouette with atherosclerosis. Left lower rib fractures. IMPRESSION: 1. Right-sided central venous catheter tip overlies the cavoatrial region. No pneumothorax. 2. Low lung volumes with continued bibasilar atelectasis or infiltrates. Possible small left effusion 3. Stable enlarged cardiomediastinal silhouette Electronically Signed   By: Jasmine Pang M.D.   On: 09/22/2017  14:49     ASSESSMENT / PLAN:  NEURO: H/o Falls Prior h/o CVA Chronic microhemorrhage of the Right Cerebellum on MRI At risk anoxic encephalopathy Chronic pain on narcotics PLAN -  Goal RASS 0 Continue low dose fentanyl gtt  precedex  Daily WUA  Wean gtts now as able Continue ASA    CARDIAC: S/p PEA  cardiac arrest - Most likely due to beta blockade, rule out active ischemia Afib RVR prior to code UNLIKELY PE ( RV WNL) Hx HTN  CAD  Bioprosthetic AVR PLAN -  Heparin off - completed 48hrs  ASA  Cardiology following  BLE venous doppler NEG echo reassuring overall Hold B blocker  Continue stress steroids - chronic pred  Hold home torsemide, coreg   Acute respiratory failure - after PEA arrest  PLAN -  Vent support - 8cc/kg  F/u CXR  F/u ABG Continue SBT  Looks good on wean - can likely extubate once more awake   RENAL AG metabolic Acidosis  AKI on CKD- worsening.  Poor UOP.  PLAN -  Will ask renal to see  HD cath placed 9/25 Dr Tyson Alias discussed with family - they are ok wit 1-2 sessions of HD if required   Endocrine: H/o IDDM adrenal insufficiency - chronic steroids  PLAN -  Continue stress steroids  Resume oral pred in next day or so    GI : PLAN -  Ct TFs ppi   Heme: Acute blood loss anemia - 1 U PRBC today, goal Hb 8 & above PLAN -  Per Ortho note ok for eliquis eventually, SQ heparin now since renal failure  No family available 9/26  Dirk Dress, NP 09/23/2017  9:49 AM Pager: [phone] or [phone]  Attending Note:  I have examined patient, reviewed labs, studies and notes. I have discussed the case with Jasper Riling, and I agree with the data and plans as amended above. 83 year old man with a history of chronic kidney disease, CVA, hypertension, bioprosthetic AVR, underwent right hip hemiarthroplasty for a displaced right femoral neck fracture. He experienced atrial fibrillation with rapid response. Unfortunately he had a bradycardic PEA arrest after receiving rate control with metoprolol. He was intubated, resuscitated. On evaluation today he is awake, tolerating pressure support. He is able to follow commands. Lungs are coarse bilaterally without any wheezing. His heart is irregularly irregular with rates in the 110s. Frequent PVCs.  Abdomen benign. Trace lower extremity edema. We will plan to extubate today. Continue his medical therapy although be conservative with rate control given his recent arrest. Important issue would be whether he wants hemodialysis should his renal function failed to rebound postarrest. He is at high-risk for failure. Also likely would benefit from volume removal. All the same unclear that he would be a good candidate as he does not want long-term hemodialysis and he be at high risk for this. I discussed CODE STATUS with him and his son at bedside. He wants medical therapy, is still considering hemodialysis if necessary, but clearly would not want CPR or mechanical ventilation. I will place these orders in the chart. Independent critical care time is 35 minutes.   Levy Pupa, MD, PhD 09/23/2017, 2:31 PM [facility] [phone] or if no answer [phone]

## 2017-09-23 NOTE — Progress Notes (Signed)
Pt's HR noted to be 93-120s and in a-fib. New EKG performed. Florentina Addison, NP and Dr. Delton Coombes informed. No new verbal orders at this time.

## 2017-09-23 NOTE — Progress Notes (Signed)
RT note-Patient was placed on wean, tolerating well, follows commands, continue to monitor.

## 2017-09-23 NOTE — Progress Notes (Signed)
PT Cancellation Note  Patient Details Name: Adam Weeks. MRN: [id number] DOB: 08-10-1927   Cancelled Treatment:    Reason Eval/Treat Not Completed: Medical issues which prohibited therapy (pt remains intubated)   Angelina Ok [facility] 09/23/2017, 8:59 AM Skip Mayer PT [phone]

## 2017-09-23 NOTE — Progress Notes (Addendum)
SLP Cancellation Note  Patient Details Name: Adam Weeks. MRN: [id number] DOB: March 28, 1927   Cancelled treatment:       Reason Eval/Treat Not Completed: Medical issues which prohibited therapy (remains intubated), will sign off. Please reorder SLP when ready.   Maxcine Ham 09/23/2017, 8:37 AM  Maxcine Ham, M.A. CCC-SLP [phone]

## 2017-09-24 ENCOUNTER — Inpatient Hospital Stay (HOSPITAL_COMMUNITY): Payer: Medicare Other

## 2017-09-24 DIAGNOSIS — S72009A Fracture of unspecified part of neck of unspecified femur, initial encounter for closed fracture: Secondary | ICD-10-CM

## 2017-09-24 DIAGNOSIS — Z7952 Long term (current) use of systemic steroids: Secondary | ICD-10-CM

## 2017-09-24 DIAGNOSIS — I480 Paroxysmal atrial fibrillation: Secondary | ICD-10-CM

## 2017-09-24 LAB — GLUCOSE, CAPILLARY
GLUCOSE-CAPILLARY: 99 mg/dL (ref 65–99)
GLUCOSE-CAPILLARY: 134 mg/dL — AB (ref 65–99)
GLUCOSE-CAPILLARY: 118 mg/dL — AB (ref 65–99)
Glucose-Capillary: 101 mg/dL — ABNORMAL HIGH (ref 65–99)
Glucose-Capillary: 107 mg/dL — ABNORMAL HIGH (ref 65–99)
Glucose-Capillary: 98 mg/dL (ref 65–99)

## 2017-09-24 LAB — RENAL FUNCTION PANEL
ALBUMIN: 2.1 g/dL — AB (ref 3.5–5.0)
Anion gap: 16 — ABNORMAL HIGH (ref 5–15)
BUN: 112 mg/dL — AB (ref 6–20)
CO2: 19 mmol/L — ABNORMAL LOW (ref 22–32)
CREATININE: 5.72 mg/dL — AB (ref 0.61–1.24)
Calcium: 7.7 mg/dL — ABNORMAL LOW (ref 8.9–10.3)
Chloride: 98 mmol/L — ABNORMAL LOW (ref 101–111)
GFR calc Af Amer: 9 mL/min — ABNORMAL LOW (ref >60)
GFR, EST NON AFRICAN AMERICAN: 8 mL/min — AB (ref >60)
Glucose, Bld: 120 mg/dL — ABNORMAL HIGH (ref 65–99)
PHOSPHORUS: 4.7 mg/dL — AB (ref 2.5–4.6)
Potassium: 4.4 mmol/L (ref 3.5–5.1)
SODIUM: 133 mmol/L — AB (ref 135–145)

## 2017-09-24 LAB — CBC
HCT: 24.4 % — ABNORMAL LOW (ref 39.0–52.0)
Hemoglobin: 8.2 g/dL — ABNORMAL LOW (ref 13.0–17.0)
MCH: 31.5 pg (ref 26.0–34.0)
MCHC: 33.6 g/dL (ref 30.0–36.0)
MCV: 93.8 fL (ref 78.0–100.0)
PLATELETS: 129 10*3/uL — AB (ref 150–400)
RBC: 2.6 MIL/uL — ABNORMAL LOW (ref 4.22–5.81)
RDW: 15.1 % (ref 11.5–15.5)
WBC: 12 10*3/uL — AB (ref 4.0–10.5)

## 2017-09-24 MED ORDER — PREDNISONE 10 MG PO TABS
10.0000 mg | ORAL_TABLET | Freq: Every day | ORAL | Status: DC
  Administered 2017-09-25 – 2017-09-27 (×3): 10 mg via ORAL
  Filled 2017-09-24 (×3): qty 1

## 2017-09-24 MED ORDER — MORPHINE SULFATE ER 15 MG PO TBCR
15.0000 mg | EXTENDED_RELEASE_TABLET | Freq: Two times a day (BID) | ORAL | Status: DC
  Administered 2017-09-24 – 2017-09-26 (×5): 15 mg via ORAL
  Filled 2017-09-24 (×5): qty 1

## 2017-09-24 MED ORDER — DOCUSATE SODIUM 100 MG PO CAPS
100.0000 mg | ORAL_CAPSULE | Freq: Every day | ORAL | Status: DC
  Administered 2017-09-24 – 2017-09-26 (×3): 100 mg via ORAL
  Filled 2017-09-24 (×3): qty 1

## 2017-09-24 MED ORDER — HYDRALAZINE HCL 20 MG/ML IJ SOLN
10.0000 mg | INTRAMUSCULAR | Status: DC | PRN
  Administered 2017-09-24: 10 mg via INTRAVENOUS
  Filled 2017-09-24: qty 1

## 2017-09-24 MED ORDER — MORPHINE SULFATE 15 MG PO TABS: 15.0000 mg | ORAL_TABLET | Freq: Two times a day (BID) | ORAL | Status: DC

## 2017-09-24 MED ORDER — HYDRALAZINE HCL 25 MG PO TABS
25.0000 mg | ORAL_TABLET | Freq: Three times a day (TID) | ORAL | Status: DC
  Administered 2017-09-24 – 2017-09-27 (×10): 25 mg via ORAL
  Filled 2017-09-24 (×10): qty 1

## 2017-09-24 MED ORDER — FUROSEMIDE 10 MG/ML IJ SOLN
80.0000 mg | Freq: Once | INTRAMUSCULAR | Status: AC
  Administered 2017-09-24: 80 mg via INTRAVENOUS
  Filled 2017-09-24: qty 8

## 2017-09-24 MED ORDER — CARVEDILOL 12.5 MG PO TABS
12.5000 mg | ORAL_TABLET | Freq: Two times a day (BID) | ORAL | Status: DC
  Administered 2017-09-24 – 2017-09-27 (×5): 12.5 mg via ORAL
  Filled 2017-09-24 (×7): qty 1

## 2017-09-24 MED ORDER — PROSIGHT PO TABS
1.0000 | ORAL_TABLET | Freq: Every day | ORAL | Status: DC
  Administered 2017-09-24 – 2017-09-26 (×3): 1 via ORAL
  Filled 2017-09-24 (×3): qty 1

## 2017-09-24 MED ORDER — INSULIN ASPART 100 UNIT/ML ~~LOC~~ SOLN
0.0000 [IU] | SUBCUTANEOUS | Status: DC
Start: 2017-09-24 — End: 2017-09-27
  Administered 2017-09-24 – 2017-09-25 (×2): 1 [IU] via SUBCUTANEOUS
  Administered 2017-09-26: 2 [IU] via SUBCUTANEOUS
  Administered 2017-09-26 – 2017-09-27 (×2): 1 [IU] via SUBCUTANEOUS
  Administered 2017-09-27: 2 [IU] via SUBCUTANEOUS

## 2017-09-24 NOTE — Progress Notes (Signed)
Subjective:  BP if anything is high now- remained extubated.  Making more urine- over 800 last 24 hours - kidney numbers not drastically different.  "I want something to exterminate me"  Feeling SOB and having CP Objective Vital signs in last 24 hours: Vitals:   09/24/17 0441 09/24/17 0500 09/24/17 0518 09/24/17 0600  BP: (!) 162/72 (!) 179/82 (!) 172/79 (!) 160/73  Pulse: 86 83  75  Resp: (!) Temp:      TempSrc:      SpO2: 97% 99%  99%  Weight:      Height:       Weight change: 0 kg (0 lb)  Intake/Output Summary (Last 24 hours) at 09/24/17 1308 Last data filed at 09/24/17 0600  Gross per 24 hour  Intake           535.47 ml  Output              820 ml  Net          -284.53 ml     Assessment/Plan: 83 year old white male with multiple medical issues status post hip surgery, A. fib, bradycardic cardiac arrest with temporary ICU management with vent/pressors with resultant A on chronic renal failure 1.Renal- acute on chronic renal failure. Baseline GFR not good in the 30s. Has worsened since cardiac arrest. BUN is now 112 but patient is not nauseated. According to the chart he is about 9 L positive and oliguric but was able to be extubated.  I discussed the risks of dialysis which do exist especially with cardiac issues. Patient seemed to be steering the conversation more to what would happen if I didn't do anything. He seems pretty set and he wants to be a DO NOT RESUSCITATE. Son and daughter-in-law in room do understand and want to abide by his wishes.  There is no absolute urgent need for dialysis. What I would hate to happen is for him to have a cardiac event on dialysis and not be resuscitated with not being able to discuss it with other family members.He is being more blunt with his wishes now, he is ready to go, see quote above.  I will give one dose IV lasix which may help to ease his SOB but morphine would be another option    Kynzlie Hilleary A    Labs: Basic  Metabolic Panel:  Recent Labs Lab 09/22/17 0503  09/22/17 1855 09/23/17 0548 09/24/17 0335  NA  --   < > 134* 136 133*  K  --   < > 4.8 4.7 4.4  CL  --   < > 102 100* 98*  CO2  --   < > 20* 19* 19*  GLUCOSE  --   < > 126* 148* 120*  BUN  --   < > 96* 104* 112*  CREATININE  --   < > 5.35* 5.68* 5.72*  CALCIUM  --   < > 7.3* 7.2* 7.7*  PHOS 6.5*  --  6.4*  --  4.7*  < > = values in this interval not displayed. Liver Function Tests:  Recent Labs Lab 10/16/2017 2002 09/24/17 0335  AST 25  --   ALT 19  --   ALKPHOS 59  --   BILITOT 0.8  --   PROT 6.5  --   ALBUMIN 3.5 2.1*   No results for input(s): LIPASE, AMYLASE in the last 168 hours. No results for input(s): AMMONIA in the last 168 hours. CBC:  Recent Labs Lab 09/24/2017 2002  09/20/17 0219 09/21/17 0519 09/22/17 0503 09/22/17 2128 09/23/17 0548 09/24/17 0335  WBC 14.6*  < > 24.4* 22.5* 15.7*  --  11.5* 12.0*  NEUTROABS 12.3*  --   --  19.8*  --   --   --   --   HGB 12.1*  < > 9.9* 8.3* 7.1* 7.3* 7.6* 8.2*  HCT 37.4*  < > 31.5* 25.5* 21.9* 22.3* 22.9* 24.4*  MCV 101.4*  < > 101.0* 97.7 99.1  --  96.2 93.8  PLT 156  < > 100* 95* 100*  --  129* 129*  < > = values in this interval not displayed. Cardiac Enzymes:  Recent Labs Lab 09/05/2017 2002 09/20/17 0908 09/21/17 0950  CKTOTAL 45*  --   --   TROPONINI  --  1.88* 0.48*   CBG:  Recent Labs Lab 09/23/17 1132 09/23/17 1558 09/23/17 1939 09/23/17 2345 09/24/17 0335  GLUCAP 110* 140* 106* 118* 98    Iron Studies: No results for input(s): IRON, TIBC, TRANSFERRIN, FERRITIN in the last 72 hours. Studies/Results: Dg Chest Portable 1 View  Result Date: 09/24/2017 CLINICAL DATA:  Respiratory failure. EXAM: PORTABLE CHEST 1 VIEW COMPARISON:  09/23/2017 . FINDINGS: Interim extubation removal of NG tube. Right IJ line in stable position . Prior CABG. Cardiomegaly with normal pulmonary vascularity. Bibasilar atelectasis/infiltrates. Similar findings on prior  exam . IMPRESSION: 1. Interim removal of endotracheal tube and NG tube. Right IJ line in stable position . 2. Stable bibasilar atelectasis/infiltrates. Stable small left pleural effusion. 3.  Prior CABG.  Stable cardiomegaly. Electronically Signed   By: Maisie Fus  Register   On: 09/24/2017 07:03   Dg Chest Port 1 View  Result Date: 09/23/2017 CLINICAL DATA:  83 year old male with acute respiratory failure. EXAM: PORTABLE CHEST 1 VIEW COMPARISON:  Nine hundred twenty-five FINDINGS: Endotracheal tube is approximately 3 cm above the level of the carina. An enteric tube courses below the diaphragm beyond the edge of the film. A right-sided central venous catheter appears in stable position overlying the cavoatrial junction. The cardiomediastinal silhouette is enlarged with low lung volumes and vascular congestion. Note is made of a left pleural effusion associated atelectasis, with or without superimposed consolidation. No pneumothorax. Osseous structures grossly intact. IMPRESSION: 1. Stable appearance of the chest with life-support devices as detailed. 2. Low lung volumes and cardiomegaly with left pleural effusion and associated atelectasis, with or without superimposed consolidation. Electronically Signed   By: Sande Brothers M.D.   On: 09/23/2017 07:02   Dg Chest Port 1 View  Result Date: 09/22/2017 CLINICAL DATA:  Central line placement EXAM: PORTABLE CHEST 1 VIEW COMPARISON:  09/21/2017 FINDINGS: Post sternotomy changes. Endotracheal tube tip is about 3.9 cm superior to the carina. Esophageal tube tip is below the diaphragm. Right-sided central venous catheter tip overlies the cavoatrial region. No right pneumothorax. Hypoventilatory changes with bibasilar atelectasis or infiltrates. Stable enlarged cardiomediastinal silhouette with atherosclerosis. Left lower rib fractures. IMPRESSION: 1. Right-sided central venous catheter tip overlies the cavoatrial region. No pneumothorax. 2. Low lung volumes with  continued bibasilar atelectasis or infiltrates. Possible small left effusion 3. Stable enlarged cardiomediastinal silhouette Electronically Signed   By: Jasmine Pang M.D.   On: 09/22/2017 14:49   Medications: Infusions: . sodium chloride      Scheduled Medications: . allopurinol  100 mg Oral Daily  . aspirin  81 mg Oral BID  . atorvastatin  40 mg Oral Daily  . carvedilol  6.25 mg Oral BID  WC  . cholecalciferol  1,000 Units Oral Daily  . docusate  100 mg Oral BID  . hydrocortisone sodium succinate  50 mg Intravenous Q12H  . insulin aspart  2-6 Units Subcutaneous Q4H  . multivitamin  1 tablet Oral Daily  . mupirocin ointment  1 application Nasal BID  . pantoprazole  40 mg Oral BID    have reviewed scheduled and prn medications.  Physical Exam: General: alert- some inc RR Heart:RRR Lungs: CBS bilat Abdomen: soft, non tender Extremities: pitting edema    09/24/2017,7:11 AM  LOS: 6 days

## 2017-09-24 NOTE — Progress Notes (Signed)
150 cc of Fentanyl gtt wasted in sink with [employer], [employer] fund[employer].

## 2017-09-24 NOTE — Progress Notes (Signed)
PT Cancellation Note  Patient Details Name: Samantha Olivera. MRN: [id number] DOB: [birth year]   Cancelled Treatment:    Reason Eval/Treat Not Completed: Patient declined, no reason specified;Pain limiting ability to participate per son, asking MD to increase pain meds as pt very uncomfortable. Declines PT at this time. Will follow.   Blake Divine A Alegandro Macnaughton 09/24/2017, 10:56 AM Mylo Red, PT, DPT [phone]

## 2017-09-24 NOTE — Progress Notes (Signed)
  Speech Language Pathology Treatment:   Dysphagia  Patient Details Name: Adam Weeks. MRN: [id number] DOB: 1927-05-20 Today's Date: 09/24/2017 Time: 7829-5621 SLP Time Calculation (min) (ACUTE ONLY): 27 min  Assessment / Plan / Recommendation Clinical Impression  Pt seen upright in bed for repeat swallow evaluation following 4 day intubation. Extubation date noted 09-23-17. Pt cooperative however becomes short of breath with increased respiratory rate easily including with PO trials despite stable oxygenation. SLP trialed ice chips and thin liquids with intermittent delayed throat clear evidenced however vocal quality remained clear. No overt s/sx of aspiration with any consistencies however increased risk persists in setting of medical conditions. Recommend dysphagia 2 (fine chopped) and thin liquids with medicines whole in puree and full supervision with all PO to reinforce pacing and monitor respiratory status with PO consumption. ST to closely monitor for diet tolerance.    HPI HPI: Pt is a 83 year old white male with multiple medical issues status post hip surgery, A. fib, bradycardic cardiac arrest with temporary ICU management with vent/pressors. SLP was following prior to respiratory decline and has been reordered post extubation for swallow evaluation.       SLP Plan   Diet recommendation: Dysphagia 2 (fine chopped) , thin liquids    Full supervision with all PO and feeding assistance    Recommendations  Medication Administration: Whole meds with puree                Oral Care Recommendations: Oral care BID Follow up Recommendations: 24 hour supervision/assistance SLP Visit Diagnosis: Dysphagia, unspecified (R13.10)       GO               Marcene Duos MA, CCC-SLP Acute Care Speech Language Pathologist    Kennieth Rad 09/24/2017, 8:49 AM

## 2017-09-24 NOTE — Progress Notes (Signed)
Progress Note  Patient Name: Adam Weeks. Date of Encounter: 09/24/2017  Primary Cardiologist: Dr. Duke Salvia (new)  Subjective   Feeling terrible.  Reports chest pain from CPR.  Breathing is a little better.  Wants to know if we can end his life.    Inpatient Medications    Scheduled Meds: . allopurinol  100 mg Oral Daily  . aspirin  81 mg Oral BID  . atorvastatin  40 mg Oral Daily  . carvedilol  12.5 mg Oral BID WC  . cholecalciferol  1,000 Units Oral Daily  . docusate sodium  100 mg Oral Daily  . insulin aspart  0-9 Units Subcutaneous Q4H  . morphine  15 mg Oral Q12H  . multivitamin  1 tablet Oral Daily  . mupirocin ointment  1 application Nasal BID  . pantoprazole  40 mg Oral BID  . [START ON 09/25/2017] predniSONE  10 mg Oral Q breakfast   Continuous Infusions:  PRN Meds: acetaminophen **OR** acetaminophen, fentaNYL (SUBLIMAZE) injection, hydrALAZINE, lidocaine, menthol-cetylpyridinium **OR** phenol, ondansetron **OR** ondansetron (ZOFRAN) IV   Vital Signs    Vitals:   09/24/17 0800 09/24/17 0842 09/24/17 0900 09/24/17 1000  BP: (!) 165/65 (!) 165/65 (!) 181/70 (!) 174/69  Pulse: 78 81 78 77  Resp: (!) 24  (!) 29 (!) 21  Temp:      TempSrc:      SpO2: 98%  99% 100%  Weight:      Height:        Intake/Output Summary (Last 24 hours) at 09/24/17 1341 Last data filed at 09/24/17 0800  Gross per 24 hour  Intake              180 ml  Output              925 ml  Net             -745 ml   Filed Weights   09/02/2017 0457 09/23/17 0500 09/23/17 1000  Weight: 77 kg (169 lb 12.1 oz) 86.1 kg (189 lb 13.1 oz) 86.1 kg (189 lb 13.1 oz)    Telemetry    Sinus rhythm.  Frequent PVCs.  Bigeminy.  Episodes of atrial fibrillation overnight.  Rate mostly 90s.  - Personally Reviewed  ECG    08/23/17: Atrial fibrillation.  Rate 126 bpm.  RBBB. LAFB.  Prior anteroseptal infarct.  - Personally Reviewed  Physical Exam   VS:  BP (!) 174/69   Pulse 77   Temp 99.4 F (37.4  C) (Oral)   Resp (!) 21   Ht 6' (1.829 m)   Wt 86.1 kg (189 lb 13.1 oz)   SpO2 100%   BMI 25.74 kg/m  , BMI Body mass index is 25.74 kg/m. GENERAL:  Ill-appearing.  Mild distress.   HEENT: Pupils equal round and reactive, fundi not visualized, oral mucosa unremarkable NECK: Unable to assess JVD. No bruits, LUNGS:  Clear to auscultation bilaterally on anterior exam. HEART:  RRR.  PMI not displaced or sustained,S1 and S2 within normal limits, no S3, no S4, no clicks, no rubs, no murmurs ABD:  Flat, positive bowel sounds normal in frequency in pitch, no bruits, no rebound, no guarding, no midline pulsatile mass, no hepatomegaly, no splenomegaly EXT:  2 plus pulses throughout, no edema, no cyanosis no clubbing SKIN:  No rashes no nodules NEURO:  Strength 5/5 throughout.  PSYCH:  AOx3.  Reports he wants to die.   Labs    Chemistry Recent Labs  Lab 08/29/2017 2002  09/22/17 1855 09/23/17 0548 09/24/17 0335  NA 140  < > 134* 136 133*  K 5.0  < > 4.8 4.7 4.4  CL 106  < > 102 100* 98*  CO2 18*  < > 20* 19* 19*  GLUCOSE 133*  < > 126* 148* 120*  BUN 42*  < > 96* 104* 112*  CREATININE 1.67*  < > 5.35* 5.68* 5.72*  CALCIUM 8.8*  < > 7.3* 7.2* 7.7*  PROT 6.5  --   --   --   --   ALBUMIN 3.5  --   --   --  2.1*  AST 25  --   --   --   --   ALT 19  --   --   --   --   ALKPHOS 59  --   --   --   --   BILITOT 0.8  --   --   --   --   GFRNONAA 35*  < > 9* 8* 8*  GFRAA 41*  < > 10* 9* 9*  ANIONGAP 16*  < > 12 17* 16*  < > = values in this interval not displayed.   Hematology  Recent Labs Lab 09/22/17 0503 09/22/17 2128 09/23/17 0548 09/24/17 0335  WBC 15.7*  --  11.5* 12.0*  RBC 2.21*  --  2.38* 2.60*  HGB 7.1* 7.3* 7.6* 8.2*  HCT 21.9* 22.3* 22.9* 24.4*  MCV 99.1  --  96.2 93.8  MCH 32.1  --  31.9 31.5  MCHC 32.4  --  33.2 33.6  RDW 14.0  --  15.9* 15.1  PLT 100*  --  129* 129*    Cardiac Enzymes  Recent Labs Lab 09/20/17 0908 09/21/17 0950  TROPONINI 1.88* 0.48*       Recent Labs Lab 09/22/2017 2006  TROPIPOC 0.04     BNPNo results for input(s): BNP, PROBNP in the last 168 hours.   DDimer No results for input(s): DDIMER in the last 168 hours.   Radiology    Dg Chest Portable 1 View  Result Date: 09/24/2017 CLINICAL DATA:  Respiratory failure. EXAM: PORTABLE CHEST 1 VIEW COMPARISON:  09/23/2017 . FINDINGS: Interim extubation removal of NG tube. Right IJ line in stable position . Prior CABG. Cardiomegaly with normal pulmonary vascularity. Bibasilar atelectasis/infiltrates. Similar findings on prior exam . IMPRESSION: 1. Interim removal of endotracheal tube and NG tube. Right IJ line in stable position . 2. Stable bibasilar atelectasis/infiltrates. Stable small left pleural effusion. 3.  Prior CABG.  Stable cardiomegaly. Electronically Signed   By: Maisie Fus  Register   On: 09/24/2017 07:03   Dg Chest Port 1 View  Result Date: 09/23/2017 CLINICAL DATA:  83 year old male with acute respiratory failure. EXAM: PORTABLE CHEST 1 VIEW COMPARISON:  Nine hundred twenty-five FINDINGS: Endotracheal tube is approximately 3 cm above the level of the carina. An enteric tube courses below the diaphragm beyond the edge of the film. A right-sided central venous catheter appears in stable position overlying the cavoatrial junction. The cardiomediastinal silhouette is enlarged with low lung volumes and vascular congestion. Note is made of a left pleural effusion associated atelectasis, with or without superimposed consolidation. No pneumothorax. Osseous structures grossly intact. IMPRESSION: 1. Stable appearance of the chest with life-support devices as detailed. 2. Low lung volumes and cardiomegaly with left pleural effusion and associated atelectasis, with or without superimposed consolidation. Electronically Signed   By: Sande Brothers M.D.   On: 09/23/2017 07:02  Dg Chest Port 1 View  Result Date: 09/22/2017 CLINICAL DATA:  Central line placement EXAM: PORTABLE CHEST 1  VIEW COMPARISON:  09/21/2017 FINDINGS: Post sternotomy changes. Endotracheal tube tip is about 3.9 cm superior to the carina. Esophageal tube tip is below the diaphragm. Right-sided central venous catheter tip overlies the cavoatrial region. No right pneumothorax. Hypoventilatory changes with bibasilar atelectasis or infiltrates. Stable enlarged cardiomediastinal silhouette with atherosclerosis. Left lower rib fractures. IMPRESSION: 1. Right-sided central venous catheter tip overlies the cavoatrial region. No pneumothorax. 2. Low lung volumes with continued bibasilar atelectasis or infiltrates. Possible small left effusion 3. Stable enlarged cardiomediastinal silhouette Electronically Signed   By: Jasmine Pang M.D.   On: 09/22/2017 14:49    Cardiac Studies   Echo 09/20/17: Study Conclusions  - Left ventricle: The cavity size was normal. There was moderate   concentric hypertrophy. Systolic function was normal. The   estimated ejection fraction was in the range of 55% to 60%. There   was dynamic obstruction. There was dynamic obstruction at rest,   with a peak velocity of 110 cm/sec and a peak gradient of 5 mm   Hg. Doppler parameters are consistent with abnormal left   ventricular relaxation (grade 1 diastolic dysfunction). - Aortic valve: A bioprosthesis was present. There was mild   stenosis. Peak velocity (S): 302 cm/s. Mean gradient (S): 18 mm   Hg. Valve area (VTI): 1.52 cm^2. Valve area (Vmax): 1.13 cm^2.   Valve area (Vmean): 1.23 cm^2. - Mitral valve: Mildly calcified annulus. - Right ventricle: The cavity size was normal. Wall thickness was   normal. Systolic function was normal. - Tricuspid valve: There was moderate regurgitation.   Patient Profile     Mr. Doberstein is an 25M with with CAD s/p CABG, severe aortic stenosis s/p AVR, CKD 3, and hypertension here with hip fracture after he slipped and fell washing his car.  Assessment & Plan    # Paroxysmal atrial fibrillation: #  Bradycardia: Mr. Pote went into atrial fibrillation post-operatively and developed profound bradycardia after receiving IV beta blocker.  9/23 he was started on amiodarone but his heart rate decreased to the 60s and he became hypotensive so it was stopped. He continues to go in and out of atrial fibrillation.  Rates were reasonably well-controlled.  Agree with increasing carvedilol.  Heparin was discontinued after 48 hours given that he hadn't had more atrial fibrillation.  Given that it recurred 9/26, would think about restarting.  His hgb did decrease to 7.2, so reasonable to continue holding for now.  No overt bleeding.   # CAD s/p CABG:  # Elevated troponin:  Troponin was elevated to 1.8-->0.48.  It is unclear if ischemia may have contributed to his initially bradycardia and arrest.  He has chest pain now that seems more attributable to CPR.  Given that he is not a dialysis candidate, no plans for [facility].  If he can't get a LHC, there is no utility in stressing him.  Continue aspirin, carvedilol, and atorvastatin.  Systolic function was normal on echo.   # Hypertension: Blood pressure is poorly-controlled.  He reports edema on amlodipine.  WIll add hydralazine  q8h. Continue carvedilol.   # s/p bioprosthetic AVR: Valve functioning properly on echo.  Mean gradient 18 mmHg.  # Acute on chronic renal failure:  Not a candidate for dialysis. Renal function continues to worsen though no overt uremic symptoms.  He requests to die and is oliguric.  May benefit from Palliative Care consultation.  For questions or updates, please contact [facility] Please consult [email or web address] for contact info under Cardiology/STEMI.      Signed, Chilton Si, MD  09/24/2017, 1:41 PM

## 2017-09-24 NOTE — Progress Notes (Signed)
..   Name: Adam Weeks. MRN: [id number] DOB: April 27, 1927    ADMISSION DATE:  09/11/2017 CONSULTATION DATE: 12-Oct-2017  REFERRING MD :  Janee Morn MD CHIEF COMPLAINT:    BRIEF PATIENT DESCRIPTION: 83 YR OLD MALE PRESENTED ON 09/10/2017 S/P FALL, a displaced right femoral neck fracture,  Right hip hemiarthroplasty. Noted to Afib RVR received Metoprolol 2.5mg  bradycardic s/p PEA arrest 9/22  SIGNIFICANT EVENTS  9/22 S/p PEA arrest  9/22  Right hemarthroplasty  STUDIES:  CTA Head and neck- 09/14/2017 MRI BRAIN 10-12-2017 neg BLE venous doppler 9/25>>> NEG   Tubes/lines:  ETT 9/22>>> 9/26 R IJ HD cath 9/25>>>  SUBJECTIVE:  Continues to have chest discomfort from his chest compressions, hip pain Extubated successfully Has told nurses and tells me that he wants to be "put out of his misery" Urine output has increased  VITAL SIGNS: Temp:  [97.5 F (36.4 C)-99.4 F (37.4 C)] 99.4 F (37.4 C) (09/27 0736) Pulse Rate:  [56-148] 78 (09/27 0900) Resp:  [15-31] 29 (09/27 0900) BP: (134-181)/(65-102) 181/70 (09/27 0900) SpO2:  [90 %-100 %] 99 % (09/27 0900) FiO2 (%):  [40 %] 40 % (09/26 1131) Weight:  [86.1 kg (189 lb 13.1 oz)] 86.1 kg (189 lb 13.1 oz) (09/26 1000)  PHYSICAL EXAMINATION: General:  Elderly ill-appearing man Neuro:  Awake, interacting, answers questions, follows commands, moves all extremities HEENT: No oral lesions PULM: Coarse bilateral breath sounds especially both bases CV:  Irregular, rate 80s, no murmur GI: Soft, normal bowel sounds Extremities: Mild bilateral lower extremity edema Skin: Widespread bruising    Recent Labs Lab 09/22/17 1855 09/23/17 0548 09/24/17 0335  NA 134* 136 133*  K 4.8 4.7 4.4  CL 102 100* 98*  CO2 20* 19* 19*  BUN 96* 104* 112*  CREATININE 5.35* 5.68* 5.72*  GLUCOSE 126* 148* 120*    Recent Labs Lab 09/22/17 0503 09/22/17 2128 09/23/17 0548 09/24/17 0335  HGB 7.1* 7.3* 7.6* 8.2*  HCT 21.9* 22.3* 22.9* 24.4*  WBC 15.7*   --  11.5* 12.0*  PLT 100*  --  129* 129*   Dg Chest Portable 1 View  Result Date: 09/24/2017 CLINICAL DATA:  Respiratory failure. EXAM: PORTABLE CHEST 1 VIEW COMPARISON:  09/23/2017 . FINDINGS: Interim extubation removal of NG tube. Right IJ line in stable position . Prior CABG. Cardiomegaly with normal pulmonary vascularity. Bibasilar atelectasis/infiltrates. Similar findings on prior exam . IMPRESSION: 1. Interim removal of endotracheal tube and NG tube. Right IJ line in stable position . 2. Stable bibasilar atelectasis/infiltrates. Stable small left pleural effusion. 3.  Prior CABG.  Stable cardiomegaly. Electronically Signed   By: Maisie Fus  Register   On: 09/24/2017 07:03   Dg Chest Port 1 View  Result Date: 09/23/2017 CLINICAL DATA:  83 year old male with acute respiratory failure. EXAM: PORTABLE CHEST 1 VIEW COMPARISON:  Nine hundred twenty-five FINDINGS: Endotracheal tube is approximately 3 cm above the level of the carina. An enteric tube courses below the diaphragm beyond the edge of the film. A right-sided central venous catheter appears in stable position overlying the cavoatrial junction. The cardiomediastinal silhouette is enlarged with low lung volumes and vascular congestion. Note is made of a left pleural effusion associated atelectasis, with or without superimposed consolidation. No pneumothorax. Osseous structures grossly intact. IMPRESSION: 1. Stable appearance of the chest with life-support devices as detailed. 2. Low lung volumes and cardiomegaly with left pleural effusion and associated atelectasis, with or without superimposed consolidation. Electronically Signed   By: Rhona Raider.D.  On: 09/23/2017 07:02   Dg Chest Port 1 View  Result Date: 09/22/2017 CLINICAL DATA:  Central line placement EXAM: PORTABLE CHEST 1 VIEW COMPARISON:  09/21/2017 FINDINGS: Post sternotomy changes. Endotracheal tube tip is about 3.9 cm superior to the carina. Esophageal tube tip is below the  diaphragm. Right-sided central venous catheter tip overlies the cavoatrial region. No right pneumothorax. Hypoventilatory changes with bibasilar atelectasis or infiltrates. Stable enlarged cardiomediastinal silhouette with atherosclerosis. Left lower rib fractures. IMPRESSION: 1. Right-sided central venous catheter tip overlies the cavoatrial region. No pneumothorax. 2. Low lung volumes with continued bibasilar atelectasis or infiltrates. Possible small left effusion 3. Stable enlarged cardiomediastinal silhouette Electronically Signed   By: Jasmine Pang M.D.   On: 09/22/2017 14:49     ASSESSMENT / PLAN:  NEURO: H/o Falls Prior h/o CVA Chronic microhemorrhage of the Right Cerebellum on MRI At risk anoxic encephalopathy Chronic pain on narcotics PLAN -  Goal RASS 0 Stop IV sedation Add back oral narcotics, consider also oral benzos Continue aspirin as ordered   CARDIAC: S/p PEA cardiac arrest - Most likely due to beta blockade, rule out active ischemia Afib RVR prior to code UNLIKELY PE ( RV WNL) Hx HTN  CAD  Bioprosthetic AVR PLAN -  Heparin now off Continue aspirin Stop stress dose steroids, restart prednisone 10 mg Increase Coreg to his home dose 9/27   Acute respiratory failure - after PEA arrest  PLAN -  Push pulmonary hygiene  RENAL AG metabolic Acidosis  AKI on CKD-  PLAN -  Unclear whether his renal function will fully rebound. He does have increased urine output, serum draining appears to be plateauing. I agree that he is a poor dialysis candidate. Appreciate nephrology assistance. Follow BMP, urine output Avoid nephrotoxins Note single dose of Lasix given nephrology on 9/27   Endocrine: H/o IDDM adrenal insufficiency - chronic steroids  PLAN -  Change back to home pred dosing   GI : PLAN -  PPI PO diet, modified  Heme: Follow CBC  PLAN -  Per Ortho note ok for eliquis eventually, SQ heparin now since renal failure  Discussed with son at  bedside on 9/27. Limited code.   Plan to SDU on 9/27, to [facility] as of 9/28  Levy Pupa, MD, PhD 09/24/2017, 9:37 AM [facility] [phone] or if no answer [phone]

## 2017-09-25 ENCOUNTER — Inpatient Hospital Stay (HOSPITAL_COMMUNITY): Payer: Medicare Other

## 2017-09-25 LAB — GLUCOSE, CAPILLARY
GLUCOSE-CAPILLARY: 106 mg/dL — AB (ref 65–99)
GLUCOSE-CAPILLARY: 116 mg/dL — AB (ref 65–99)
GLUCOSE-CAPILLARY: 131 mg/dL — AB (ref 65–99)
Glucose-Capillary: 101 mg/dL — ABNORMAL HIGH (ref 65–99)
Glucose-Capillary: 89 mg/dL (ref 65–99)
Glucose-Capillary: 113 mg/dL — ABNORMAL HIGH (ref 65–99)

## 2017-09-25 LAB — CBC
HEMATOCRIT: 26.6 % — AB (ref 39.0–52.0)
HEMOGLOBIN: 8.8 g/dL — AB (ref 13.0–17.0)
MCH: 31.1 pg (ref 26.0–34.0)
MCHC: 33.1 g/dL (ref 30.0–36.0)
MCV: 94 fL (ref 78.0–100.0)
PLATELETS: 196 10*3/uL (ref 150–400)
RBC: 2.83 MIL/uL — AB (ref 4.22–5.81)
RDW: 14.9 % (ref 11.5–15.5)
WBC: 11.7 10*3/uL — ABNORMAL HIGH (ref 4.0–10.5)

## 2017-09-25 LAB — CULTURE, BLOOD (ROUTINE X 2)
Culture: NO GROWTH
Culture: NO GROWTH
SPECIAL REQUESTS: ADEQUATE
SPECIAL REQUESTS: ADEQUATE

## 2017-09-25 MED ORDER — RESOURCE THICKENUP CLEAR PO POWD
ORAL | Status: DC | PRN
  Filled 2017-09-25: qty 125

## 2017-09-25 MED ORDER — FUROSEMIDE 10 MG/ML IJ SOLN
80.0000 mg | Freq: Every day | INTRAMUSCULAR | Status: DC
  Administered 2017-09-25 – 2017-09-29 (×5): 80 mg via INTRAVENOUS
  Filled 2017-09-25 (×6): qty 8

## 2017-09-25 NOTE — Care Management Note (Signed)
Case Management Note  Patient Details  Name: Adam Weeks. MRN: [id number] Date of Birth: [birth year]  Subjective/Objective:                 Patient fro home w family support prior to admission. Admitted for hip fx, had PEA after sx, admitted to ICU, ext 9/26 continues with CP 2/2 compressions, patient and family declining to work w PT 2/2 to pain. PT signed off. CSW following for SNF placement, process started.    Action/Plan:  CM/ CSW will continue to follow for DC planning, anticipate SNF at DC.  Expected Discharge Date:                  Expected Discharge Plan:  Skilled Nursing Facility  In-House Referral:  Clinical Social Work  Discharge planning Services  CM Consult  Post Acute Care Choice:    Choice offered to:     DME Arranged:    DME Agency:     HH Arranged:    HH Agency:     Status of Service:  In process, will continue to follow  If discussed at Long Length of Stay Meetings, dates discussed:    Additional Comments:  Lawerance Sabal, RN 09/25/2017, 10:27 AM

## 2017-09-25 NOTE — Progress Notes (Signed)
  Speech Language Pathology Treatment: Dysphagia  Patient Details Name: Adam Weeks. MRN: [id number] DOB: 1927/02/18 Today's Date: 09/25/2017 Time: 4098-1191 SLP Time Calculation (min) (ACUTE ONLY): 20 min  Assessment / Plan / Recommendation Clinical Impression  SLP provided skilled observation during breakfast meal with intermittent throat clearing noted following sips of water only. Although his vocal quality is at baseline per pt/son report, he remains at risk for reduced sensation of aspiration particularly given his respiratory status. He becomes increasingly short of breath as he tries to eat and drink. SLP provided Min cues for rest breaks and provided assistance with feeding to conserve energy. Pt also says that he feels like he is "suffocating" when drinking water. No throat clearing was observed during intake of nectar thick liquids, and he reports subjectively feeling like he can breathe better. Recommend continuing Dys 2 diet but with modifications to nectar thick liquids. Per MD notes, it appears as though palliative care may be getting involved as pt may be in favor of a more comfort-driven approach. At this time, he is agreeable to try nectar thick liquids because he thinks he feels better with them. After discussion with palliative care, diet could potentially be liberalized for comfort feeds.   HPI HPI: Pt is a 83 year old white male with multiple medical issues status post hip surgery, A. fib, bradycardic cardiac arrest with temporary ICU management with vent/pressors. SLP was following prior to respiratory decline and has been reordered post extubation for swallow evaluation.       SLP Plan  Continue with current plan of care       Recommendations  Diet recommendations: Dysphagia 2 (fine chop);Nectar-thick liquid Liquids provided via: Cup;Straw Medication Administration: Whole meds with puree Supervision: Staff to assist with self feeding;Full supervision/cueing for  compensatory strategies Compensations: Slow rate;Small sips/bites;Follow solids with liquid;Other (Comment) (take frequent rest breaks) Postural Changes and/or Swallow Maneuvers: Seated upright 90 degrees                Oral Care Recommendations: Oral care BID Follow up Recommendations: 24 hour supervision/assistance SLP Visit Diagnosis: Dysphagia, unspecified (R13.10) Plan: Continue with current plan of care       GO                Maxcine Ham 09/25/2017, 9:41 AM  Maxcine Ham, M.A. CCC-SLP [phone]

## 2017-09-25 NOTE — Progress Notes (Signed)
Orthopaedic Trauma Progress Note  S: Extubated, having a lot of chest pain.  O:  Vitals:   09/25/17 0617 09/25/17 0725  BP: (!) 145/81   Pulse: (!) 111   Resp: 16   Temp:  (!) 97.5 F (36.4 C)  SpO2: 99%   RLE: Dressing intact, no significant swelling about hip. Gentle ROM without discomfort. Intact DF/PF/EHL, sensation globally intact  Labs: Hgb 8.8  A/P: POD6 s/p right hip hemi  -WBAT, no hip ROM restrictions -Plan to follow up in clinic with me ([phone]) in 2 weeks for wound check and x-rays -Will remotely follow while in hospital, please call with questions.   Roby Lofts, MD [facility] [phone] (phone)

## 2017-09-25 NOTE — Progress Notes (Signed)
PT Cancellation Note  Patient Details Name: Adam Weeks. MRN: [id number] DOB: [birth year]   Cancelled Treatment:    Reason Eval/Treat Not Completed: Patient declined, no reason specified;Pain limiting ability to participate Son refusing therapy another day. Reports he told the doctor that pt would not be able to handle it. Will sign off for now, please re-consult if/when pt appropriate or willing to participate. Son and RN made aware.    Blake Divine A Yaslene Lindamood 09/25/2017, 10:13 AM Mylo Red, PT, DPT [phone]

## 2017-09-25 NOTE — Progress Notes (Signed)
PROGRESS NOTE    Adam Weeks.  ZOX:[id number] DOB: Feb 09, 1927 DOA: 09/11/2017 PCP: [name], MD    Brief Narrative:  83 YR OLD MALE PRESENTED ON 09/12/2017 S/P FALL, a displaced right femoral neck fracture,  Right hip hemiarthroplasty. Noted to Afib RVR received Metoprolol 2.5mg  bradycardic s/p PEA arrest 9/22  Assessment & Plan:   Principal Problem:   Closed right hip fracture, initial encounter (HCC) - Ortho managing - continue pain control - PT on board.  AKI - nephrology on board and managing. Most likely related to cardiac arrest.   Active Problems:   Current chronic use of systemic steroids -  Continue prednisone at current dose    HTN (hypertension) - carvedilol,     Syncope and collapse   Aphasia - Pt communicating with examiner today.    Bradycardia - resolved    Cardiac arrest (HCC) - Pt alert and awake and responding appropriately to questions    S/P CABG (coronary artery bypass graft) - continue statin and aspirin - current chest pain most likely due to chest compressions    Acute respiratory failure (HCC) - resolved, patient extubated. - supplemental oxygen as needed.     Paroxysmal atrial fibrillation (HCC) - Continue B blocker - pt is on aspirin currently, suspect with falls he is not candidate for anticoagulation   DVT prophylaxis: SCD's Code Status: Partial Family Communication: d/c son at bedside. Disposition Plan: pending improvement in condition   Consultants:   Nephrology  Cardiology  Orthopedic  Neurology  Procedures: POD6 s/p right hip hemi   Antimicrobials: None   Subjective:   Objective: Vitals:   09/25/17 0900 09/25/17 1000 09/25/17 1120 09/25/17 1428  BP: (!) 141/78 (!) 140/115  111/70  Pulse: 86 100    Resp: 17 20    Temp:   98.2 F (36.8 C)   TempSrc:   Oral   SpO2: 99% 98%    Weight:      Height:        Intake/Output Summary (Last 24 hours) at 09/25/17 1444 Last data filed at 09/25/17  0200  Gross per 24 hour  Intake                0 ml  Output              565 ml  Net             -565 ml   Filed Weights   09/17/2017 0457 09/23/17 0500 09/23/17 1000  Weight: 77 kg (169 lb 12.1 oz) 86.1 kg (189 lb 13.1 oz) 86.1 kg (189 lb 13.1 oz)    Examination:  General exam: Appears calm and comfortable, in nad. Respiratory system: Clear to auscultation. Respiratory effort normal. Equal chest rise. Cardiovascular system: S1 & S2 heard, RRR. No JVD, murmurs, rubs Gastrointestinal system: Abdomen is nondistended, soft and nontender.  Central nervous system: Alert and oriented. No focal neurological deficits. Extremities: warm + pulses Skin: No rashes, lesions or ulcers, on limited exam. Psychiatry:  Mood & affect appropriate.     Data Reviewed: I have personally reviewed following labs and imaging studies  CBC:  Recent Labs Lab 09/05/2017 2002  09/21/17 0519 09/22/17 0503 09/22/17 2128 09/23/17 0548 09/24/17 0335 09/25/17 0409  WBC 14.6*  < > 22.5* 15.7*  --  11.5* 12.0* 11.7*  NEUTROABS 12.3*  --  19.8*  --   --   --   --   --   HGB 12.1*  < >  8.3* 7.1* 7.3* 7.6* 8.2* 8.8*  HCT 37.4*  < > 25.5* 21.9* 22.3* 22.9* 24.4* 26.6*  MCV 101.4*  < > 97.7 99.1  --  96.2 93.8 94.0  PLT 156  < > 95* 100*  --  129* 129* 196  < > = values in this interval not displayed. Basic Metabolic Panel:  Recent Labs Lab 09/20/17 1846 09/21/17 0519 09/21/17 0950 09/22/17 0503 09/22/17 0730 09/22/17 1855 09/23/17 0548 09/24/17 0335  NA  --  133*  --   --  134* 134* 136 133*  K  --  4.9  --   --  5.0 4.8 4.7 4.4  CL  --  102  --   --  104 102 100* 98*  CO2  --  19*  --   --  18* 20* 19* 19*  GLUCOSE  --  133*  --   --  156* 126* 148* 120*  BUN  --  64*  --   --  84* 96* 104* 112*  CREATININE  --  3.87*  --   --  4.83* 5.35* 5.68* 5.72*  CALCIUM  --  7.7*  --   --  7.2* 7.3* 7.2* 7.7*  MG 2.6* 2.4 2.4 2.5*  --  2.5*  --   --   PHOS 5.6* 5.6* 6.4* 6.5*  --  6.4*  --  4.7*    GFR: Estimated Creatinine Clearance: 9.8 mL/min (A) (by C-G formula based on SCr of 5.72 mg/dL (H)). Liver Function Tests:  Recent Labs Lab 09/21/2017 2002 09/24/17 0335  AST 25  --   ALT 19  --   ALKPHOS 59  --   BILITOT 0.8  --   PROT 6.5  --   ALBUMIN 3.5 2.1*   No results for input(s): LIPASE, AMYLASE in the last 168 hours. No results for input(s): AMMONIA in the last 168 hours. Coagulation Profile:  Recent Labs Lab 09/06/2017 2002  INR 1.10   Cardiac Enzymes:  Recent Labs Lab 09/25/2017 2002 09/20/17 0908 09/21/17 0950  CKTOTAL 45*  --   --   TROPONINI  --  1.88* 0.48*   BNP (last 3 results)  Recent Labs  04/13/17 1552  PROBNP 399.0*   HbA1C: No results for input(s): HGBA1C in the last 72 hours. CBG:  Recent Labs Lab 09/24/17 1956 09/24/17 2346 09/25/17 0311 09/25/17 0722 09/25/17 1115  GLUCAP 118* 101* 101* 89 106*   Lipid Profile: No results for input(s): CHOL, HDL, LDLCALC, TRIG, CHOLHDL, LDLDIRECT in the last 72 hours. Thyroid Function Tests: No results for input(s): TSH, T4TOTAL, FREET4, T3FREE, THYROIDAB in the last 72 hours. Anemia Panel: No results for input(s): VITAMINB12, FOLATE, FERRITIN, TIBC, IRON, RETICCTPCT in the last 72 hours. Sepsis Labs:  Recent Labs Lab 09-Oct-2017 2249 09/20/17 0219  LATICACIDVEN 6.5* 2.6*    Recent Results (from the past 240 hour(s))  MRSA PCR Screening     Status: Abnormal   Collection Time: 09/20/17 12:10 AM  Result Value Ref Range Status   MRSA by PCR POSITIVE (A) NEGATIVE Final    Comment:        The GeneXpert MRSA Assay (FDA approved for NASAL specimens only), is one component of a comprehensive MRSA colonization surveillance program. It is not intended to diagnose MRSA infection nor to guide or monitor treatment for MRSA infections. RESULT CALLED TO, READ BACK BY AND VERIFIED WITH: WILLIS,S RN [id number] AT 0324 SKEEN,P   Culture, blood (Routine X 2) w Reflex to ID Panel  Status: None    Collection Time: 09/20/17  7:57 AM  Result Value Ref Range Status   Specimen Description BLOOD LEFT ANTECUBITAL  Final   Special Requests   Final    BOTTLES DRAWN AEROBIC ONLY Blood Culture adequate volume   Culture NO GROWTH 5 DAYS  Final   Report Status 09/25/2017 FINAL  Final  Culture, blood (Routine X 2) w Reflex to ID Panel     Status: None   Collection Time: 09/20/17  8:03 AM  Result Value Ref Range Status   Specimen Description BLOOD BLOOD LEFT HAND  Final   Special Requests   Final    BOTTLES DRAWN AEROBIC ONLY Blood Culture adequate volume   Culture NO GROWTH 5 DAYS  Final   Report Status 09/25/2017 FINAL  Final  Urine Culture     Status: None   Collection Time: 09/20/17 10:32 AM  Result Value Ref Range Status   Specimen Description URINE, CATHETERIZED  Final   Special Requests NONE  Final   Culture NO GROWTH  Final   Report Status 09/21/2017 FINAL  Final         Radiology Studies: Dg Chest Portable 1 View  Result Date: 09/24/2017 CLINICAL DATA:  Respiratory failure. EXAM: PORTABLE CHEST 1 VIEW COMPARISON:  09/23/2017 . FINDINGS: Interim extubation removal of NG tube. Right IJ line in stable position . Prior CABG. Cardiomegaly with normal pulmonary vascularity. Bibasilar atelectasis/infiltrates. Similar findings on prior exam . IMPRESSION: 1. Interim removal of endotracheal tube and NG tube. Right IJ line in stable position . 2. Stable bibasilar atelectasis/infiltrates. Stable small left pleural effusion. 3.  Prior CABG.  Stable cardiomegaly. Electronically Signed   By: Maisie Fus  Register   On: 09/24/2017 07:03        Scheduled Meds: . allopurinol  100 mg Oral Daily  . aspirin  81 mg Oral BID  . atorvastatin  40 mg Oral Daily  . carvedilol  12.5 mg Oral BID WC  . cholecalciferol  1,000 Units Oral Daily  . docusate sodium  100 mg Oral Daily  . furosemide  80 mg Intravenous Daily  . hydrALAZINE  25 mg Oral Q8H  . insulin aspart  0-9 Units Subcutaneous Q4H  .  morphine  15 mg Oral Q12H  . multivitamin  1 tablet Oral Daily  . pantoprazole  40 mg Oral BID  . predniSONE  10 mg Oral Q breakfast   Continuous Infusions:   LOS: 7 days    Time spent: > 35 minutes    Penny Pia, MD [facility] Pager [phone]  If 7PM-7AM, please contact night-coverage [email or web address] Password [facility] 09/25/2017, 2:44 PM

## 2017-09-25 NOTE — Progress Notes (Signed)
Subjective:  BP if anything is high now- remains extubated.  Making more urine- over 1200 last 24 hours - kidney numbers not checked (not that I think they need to be) .  Still  Feeling SOB and having CP- med helps for a little while    Objective Vital signs in last 24 hours: Vitals:   09/25/17 0600 09/25/17 0601 09/25/17 0617 09/25/17 0725  BP: (!) 171/93 (!) 171/93 (!) 145/81   Pulse: 97  (!) 111   Resp: (!) 24  16   Temp:    (!) 97.5 F (36.4 C)  TempSrc:    Oral  SpO2: 100%  99%   Weight:      Height:       Weight change:   Intake/Output Summary (Last 24 hours) at 09/25/17 0800 Last data filed at 09/25/17 0200  Gross per 24 hour  Intake                0 ml  Output             1040 ml  Net            -1040 ml     Assessment/Plan: 83 year old white male with multiple medical issues status post hip surgery, A. fib, bradycardic cardiac arrest with temporary ICU management with vent/pressors with resultant A on chronic renal failure 1.Renal- acute on chronic renal failure. Baseline GFR not good in the 30s. Has worsened since cardiac arrest. BUN is  over 100  but patient is not nauseated. According to the chart he is about 8 L positive and was oliguric but was able to be extubated.  I discussed the risks of dialysis which do exist especially with cardiac issues. Patient seemed to be steering the conversation more to what would happen if I didn't do anything. He seems pretty set and he wants to be a DO NOT RESUSCITATE. We have decided against using HD as a tool.  He is now nonoliguric - seemed to respond to lasix so will keep him on a daily dose for comfort.  He is being more blunt with his wishes now, he is ready to be done, Renal will sign off- plans per CCM - consider more of a transition to comfort at this point  Adam Weeks A    Labs: Basic Metabolic Panel:  Recent Labs Lab 09/22/17 0503  09/22/17 1855 09/23/17 0548 09/24/17 0335  NA  --   < > 134* 136 133*  K  --    < > 4.8 4.7 4.4  CL  --   < > 102 100* 98*  CO2  --   < > 20* 19* 19*  GLUCOSE  --   < > 126* 148* 120*  BUN  --   < > 96* 104* 112*  CREATININE  --   < > 5.35* 5.68* 5.72*  CALCIUM  --   < > 7.3* 7.2* 7.7*  PHOS 6.5*  --  6.4*  --  4.7*  < > = values in this interval not displayed. Liver Function Tests:  Recent Labs Lab September 28, 2017 2002 09/24/17 0335  AST 25  --   ALT 19  --   ALKPHOS 59  --   BILITOT 0.8  --   PROT 6.5  --   ALBUMIN 3.5 2.1*   No results for input(s): LIPASE, AMYLASE in the last 168 hours. No results for input(s): AMMONIA in the last 168 hours. CBC:  Recent Labs Lab 09-28-2017 2002  09/21/17  1610 09/22/17 0503  09/23/17 0548 09/24/17 0335 09/25/17 0409  WBC 14.6*  < > 22.5* 15.7*  --  11.5* 12.0* 11.7*  NEUTROABS 12.3*  --  19.8*  --   --   --   --   --   HGB 12.1*  < > 8.3* 7.1*  < > 7.6* 8.2* 8.8*  HCT 37.4*  < > 25.5* 21.9*  < > 22.9* 24.4* 26.6*  MCV 101.4*  < > 97.7 99.1  --  96.2 93.8 94.0  PLT 156  < > 95* 100*  --  129* 129* 196  < > = values in this interval not displayed. Cardiac Enzymes:  Recent Labs Lab Sep 24, 2017 2002 09/20/17 0908 09/21/17 0950  CKTOTAL 45*  --   --   TROPONINI  --  1.88* 0.48*   CBG:  Recent Labs Lab 09/24/17 1516 09/24/17 1956 09/24/17 2346 09/25/17 0311 09/25/17 0722  GLUCAP 134* 118* 101* 101* 89    Iron Studies: No results for input(s): IRON, TIBC, TRANSFERRIN, FERRITIN in the last 72 hours. Studies/Results: Dg Chest Portable 1 View  Result Date: 09/24/2017 CLINICAL DATA:  Respiratory failure. EXAM: PORTABLE CHEST 1 VIEW COMPARISON:  09/23/2017 . FINDINGS: Interim extubation removal of NG tube. Right IJ line in stable position . Prior CABG. Cardiomegaly with normal pulmonary vascularity. Bibasilar atelectasis/infiltrates. Similar findings on prior exam . IMPRESSION: 1. Interim removal of endotracheal tube and NG tube. Right IJ line in stable position . 2. Stable bibasilar atelectasis/infiltrates.  Stable small left pleural effusion. 3.  Prior CABG.  Stable cardiomegaly. Electronically Signed   By: Maisie Fus  Register   On: 09/24/2017 07:03   Medications: Infusions:   Scheduled Medications: . allopurinol  100 mg Oral Daily  . aspirin  81 mg Oral BID  . atorvastatin  40 mg Oral Daily  . carvedilol  12.5 mg Oral BID WC  . cholecalciferol  1,000 Units Oral Daily  . docusate sodium  100 mg Oral Daily  . hydrALAZINE  25 mg Oral Q8H  . insulin aspart  0-9 Units Subcutaneous Q4H  . morphine  15 mg Oral Q12H  . multivitamin  1 tablet Oral Daily  . pantoprazole  40 mg Oral BID  . predniSONE  10 mg Oral Q breakfast    have reviewed scheduled and prn medications.  Physical Exam: General: alert- some inc RR Heart:RRR Lungs: CBS bilat Abdomen: soft, non tender Extremities: pitting edema    09/25/2017,8:00 AM  LOS: 7 days

## 2017-09-25 NOTE — Progress Notes (Signed)
Nutrition Follow-up  DOCUMENTATION CODES:   Not applicable  INTERVENTION:    Mighty Shake II (each supplement provides 480-500 kcals and 20-23 grams of protein) or Magic cup (each supplement provides 290 kcal and 9 grams of protein) TID with meals   NUTRITION DIAGNOSIS:   Increased nutrient needs related to healing & recovery after femur fx as evidenced by estimated needs.  Ongoing  GOAL:   Patient will meet greater than or equal to 90% of their needs  Unmet  MONITOR:   PO intake, Supplement acceptance, Skin, Labs, I & O's  ASSESSMENT:   Pt with PMH of TIA, HTN, CAD s/p CABG, AVR s/p pig valve, CKD stage 3 admitted after fall/TIA with closed hip fx now s/p repair.   Patient was extubated on 9/26.  He is only consuming 10-25% of meals. C/O a lot of pain. Will add pre-thickened PO supplements with meal trays. Labs and medications reviewed.  Diet Order:  DIET DYS 2 Room service appropriate? Yes; Fluid consistency: Nectar Thick  Skin:   (surgical incisions, skin tears)  Last BM:  unknown  Height:   Ht Readings from Last 1 Encounters:  09/23/17 6' (1.829 m)    Weight:   Wt Readings from Last 1 Encounters:  09/23/17 189 lb 13.1 oz (86.1 kg)    Ideal Body Weight:  75.4 kg  BMI:  Body mass index is 25.74 kg/m.  Estimated Nutritional Needs:   Kcal:  1900-2100  Protein:  90-110 gm  Fluid:  2 L  EDUCATION NEEDS:   No education needs identified at this time  Joaquin Courts, RD, LDN, CNSC Pager [phone] After Hours Pager [phone]

## 2017-09-25 NOTE — Progress Notes (Signed)
Progress Note  Patient Name: Adam Weeks. Date of Encounter: 09/25/2017  Primary Cardiologist: Dr. Duke Salvia (new)  Subjective   Feeling terrible. Reports shortness of breath and back pain.   Inpatient Medications    Scheduled Meds: . allopurinol  100 mg Oral Daily  . aspirin  81 mg Oral BID  . atorvastatin  40 mg Oral Daily  . carvedilol  12.5 mg Oral BID WC  . cholecalciferol  1,000 Units Oral Daily  . docusate sodium  100 mg Oral Daily  . furosemide  80 mg Intravenous Daily  . hydrALAZINE  25 mg Oral Q8H  . insulin aspart  0-9 Units Subcutaneous Q4H  . morphine  15 mg Oral Q12H  . multivitamin  1 tablet Oral Daily  . pantoprazole  40 mg Oral BID  . predniSONE  10 mg Oral Q breakfast   Continuous Infusions:  PRN Meds: acetaminophen **OR** acetaminophen, fentaNYL (SUBLIMAZE) injection, hydrALAZINE, lidocaine, menthol-cetylpyridinium **OR** phenol, ondansetron **OR** ondansetron (ZOFRAN) IV, RESOURCE THICKENUP CLEAR   Vital Signs    Vitals:   09/25/17 0800 09/25/17 0900 09/25/17 1000 09/25/17 1120  BP: 126/78 (!) 141/78 (!) 140/115   Pulse: (!) 105 86 100   Resp: Temp:    98.2 F (36.8 C)  TempSrc:    Oral  SpO2: 99% 99% 98%   Weight:      Height:        Intake/Output Summary (Last 24 hours) at 09/25/17 1417 Last data filed at 09/25/17 0200  Gross per 24 hour  Intake                0 ml  Output              565 ml  Net             -565 ml   Filed Weights   10-09-2017 0457 09/23/17 0500 09/23/17 1000  Weight: 77 kg (169 lb 12.1 oz) 86.1 kg (189 lb 13.1 oz) 86.1 kg (189 lb 13.1 oz)    Telemetry    Sinus rhythm.  Atrial fibrillation since 9/27.  Rate 90s-110s- Personally Reviewed  ECG    08/23/17: Atrial fibrillation.  Rate 126 bpm.  RBBB. LAFB.  Prior anteroseptal infarct.  - Personally Reviewed  Physical Exam   VS:  BP (!) 140/115   Pulse 100   Temp 98.2 F (36.8 C) (Oral)   Resp 20   Ht 6' (1.829 m)   Wt 86.1 kg (189 lb 13.1 oz)    SpO2 98%   BMI 25.74 kg/m  , BMI Body mass index is 25.74 kg/m. GENERAL:  Ill-appearing.  Mild distress.   HEENT: Pupils equal round and reactive, fundi not visualized, oral mucosa unremarkable NECK: Unable to assess JVD. No bruits, LUNGS:  Clear to auscultation bilaterally on anterior exam. HEART:  Irregularly irregular.  PMI not displaced or sustained,S1 and S2 within normal limits, no S3, no S4, no clicks, no rubs, no murmurs ABD:  Flat, positive bowel sounds normal in frequency in pitch, no bruits, no rebound, no guarding, no midline pulsatile mass, no hepatomegaly, no splenomegaly EXT:  2 plus pulses throughout, no edema, no cyanosis no clubbing SKIN:  No rashes no nodules NEURO:  Strength 5/5 throughout.  PSYCH:  AOx3.  Reports he wants to die.   Labs    Chemistry Recent Labs Lab 09/14/2017 2002  09/22/17 1855 09/23/17 0548 09/24/17 0335  NA 140  < > 134*  136 133*  K 5.0  < > 4.8 4.7 4.4  CL 106  < > 102 100* 98*  CO2 18*  < > 20* 19* 19*  GLUCOSE 133*  < > 126* 148* 120*  BUN 42*  < > 96* 104* 112*  CREATININE 1.67*  < > 5.35* 5.68* 5.72*  CALCIUM 8.8*  < > 7.3* 7.2* 7.7*  PROT 6.5  --   --   --   --   ALBUMIN 3.5  --   --   --  2.1*  AST 25  --   --   --   --   ALT 19  --   --   --   --   ALKPHOS 59  --   --   --   --   BILITOT 0.8  --   --   --   --   GFRNONAA 35*  < > 9* 8* 8*  GFRAA 41*  < > 10* 9* 9*  ANIONGAP 16*  < > 12 17* 16*  < > = values in this interval not displayed.   Hematology  Recent Labs Lab 09/23/17 0548 09/24/17 0335 09/25/17 0409  WBC 11.5* 12.0* 11.7*  RBC 2.38* 2.60* 2.83*  HGB 7.6* 8.2* 8.8*  HCT 22.9* 24.4* 26.6*  MCV 96.2 93.8 94.0  MCH 31.9 31.5 31.1  MCHC 33.2 33.6 33.1  RDW 15.9* 15.1 14.9  PLT 129* 129* 196    Cardiac Enzymes  Recent Labs Lab 09/20/17 0908 09/21/17 0950  TROPONINI 1.88* 0.48*     Recent Labs Lab 08/31/2017 2006  TROPIPOC 0.04     BNPNo results for input(s): BNP, PROBNP in the last 168  hours.   DDimer No results for input(s): DDIMER in the last 168 hours.   Radiology    Dg Chest Portable 1 View  Result Date: 09/24/2017 CLINICAL DATA:  Respiratory failure. EXAM: PORTABLE CHEST 1 VIEW COMPARISON:  09/23/2017 . FINDINGS: Interim extubation removal of NG tube. Right IJ line in stable position . Prior CABG. Cardiomegaly with normal pulmonary vascularity. Bibasilar atelectasis/infiltrates. Similar findings on prior exam . IMPRESSION: 1. Interim removal of endotracheal tube and NG tube. Right IJ line in stable position . 2. Stable bibasilar atelectasis/infiltrates. Stable small left pleural effusion. 3.  Prior CABG.  Stable cardiomegaly. Electronically Signed   By: Adam Fus  Weeks   On: 09/24/2017 07:03    Cardiac Studies   Echo 09/20/17: Study Conclusions  - Left ventricle: The cavity size was normal. There was moderate   concentric hypertrophy. Systolic function was normal. The   estimated ejection fraction was in the range of 55% to 60%. There   was dynamic obstruction. There was dynamic obstruction at rest,   with a peak velocity of 110 cm/sec and a peak gradient of 5 mm   Hg. Doppler parameters are consistent with abnormal left   ventricular relaxation (grade 1 diastolic dysfunction). - Aortic valve: A bioprosthesis was present. There was mild   stenosis. Peak velocity (S): 302 cm/s. Mean gradient (S): 18 mm   Hg. Valve area (VTI): 1.52 cm^2. Valve area (Vmax): 1.13 cm^2.   Valve area (Vmean): 1.23 cm^2. - Mitral valve: Mildly calcified annulus. - Right ventricle: The cavity size was normal. Wall thickness was   normal. Systolic function was normal. - Tricuspid valve: There was moderate regurgitation.   Patient Profile     Adam Weeks is an 89M with with CAD s/p CABG, severe aortic stenosis s/p AVR, CKD 3,  and hypertension here with hip fracture after he slipped and fell washing his car.  Assessment & Plan    # Paroxysmal atrial fibrillation: #  Bradycardia: Adam Weeks went into atrial fibrillation post-operatively and developed profound bradycardia after receiving IV beta blocker.  9/23 he was started on amiodarone but his heart rate decreased to the 60s and he became hypotensive so it was stopped. He continues to go in and out of atrial fibrillation.  He has been sustaining in atrial fibrillation with rates 90s-110s.  BP is now too low to increase carvedilol. H/H improving.  We will start back IV heparin.  # CAD s/p CABG:  # Elevated troponin:  Troponin was elevated to 1.8-->0.48.  It is unclear if ischemia may have contributed to his initially bradycardia and arrest.  He has chest pain now that seems more attributable to CPR.  He is not interested in dialysis.  Continue aspirin, carvedilol, and atorvastatin.  Systolic function was normal on echo.   # Hypertension: Blood pressure is very labile.  He is now in the 80s-100s systolic.  poorly-controlled.  He reports edema on amlodipine.  Swich hydralazine to  q8h PRN SBP >140. Continue carvedilol.   # s/p bioprosthetic AVR: Valve functioning properly on echo.  Mean gradient 18 mmHg.  # Acute on chronic renal failure:  Mr. Pascucci is not interested in dialysis.  He didn't get a BMP today.  We will add one.  He did not respond to IV lasix today.  He reports increased shortness of breath.  Volume status is difficult to assess.  We will check a CVP and CXR.   Mr. Bracamonte would benefit from Palliative Care consultation.  He has expressed his desire to die several times to multiple staff members.  He may wish to transition to hospice or CMO.    For questions or updates, please contact [facility] Please consult [email or web address] for contact info under Cardiology/STEMI.      Signed, Chilton Si, MD  09/25/2017, 2:17 PM

## 2017-09-26 ENCOUNTER — Encounter (HOSPITAL_COMMUNITY): Payer: Medicare Other

## 2017-09-26 DIAGNOSIS — Z515 Encounter for palliative care: Secondary | ICD-10-CM

## 2017-09-26 LAB — GLUCOSE, CAPILLARY
GLUCOSE-CAPILLARY: 111 mg/dL — AB (ref 65–99)
GLUCOSE-CAPILLARY: 153 mg/dL — AB (ref 65–99)
GLUCOSE-CAPILLARY: 190 mg/dL — AB (ref 65–99)
Glucose-Capillary: 103 mg/dL — ABNORMAL HIGH (ref 65–99)
Glucose-Capillary: 129 mg/dL — ABNORMAL HIGH (ref 65–99)

## 2017-09-26 LAB — CBC
HCT: 26.8 % — ABNORMAL LOW (ref 39.0–52.0)
Hemoglobin: 8.8 g/dL — ABNORMAL LOW (ref 13.0–17.0)
MCH: 30.9 pg (ref 26.0–34.0)
MCHC: 32.8 g/dL (ref 30.0–36.0)
MCV: 94 fL (ref 78.0–100.0)
Platelets: 240 10*3/uL (ref 150–400)
RBC: 2.85 MIL/uL — ABNORMAL LOW (ref 4.22–5.81)
RDW: 14.7 % (ref 11.5–15.5)
WBC: 12.3 10*3/uL — ABNORMAL HIGH (ref 4.0–10.5)

## 2017-09-26 LAB — BASIC METABOLIC PANEL
Anion gap: 13 (ref 5–15)
BUN: 133 mg/dL — AB (ref 6–20)
CALCIUM: 8.6 mg/dL — AB (ref 8.9–10.3)
CO2: 19 mmol/L — ABNORMAL LOW (ref 22–32)
CREATININE: 6.19 mg/dL — AB (ref 0.61–1.24)
Chloride: 98 mmol/L — ABNORMAL LOW (ref 101–111)
GFR calc Af Amer: 8 mL/min — ABNORMAL LOW (ref >60)
GFR, EST NON AFRICAN AMERICAN: 7 mL/min — AB (ref >60)
GLUCOSE: 109 mg/dL — AB (ref 65–99)
Potassium: 4.7 mmol/L (ref 3.5–5.1)
Sodium: 130 mmol/L — ABNORMAL LOW (ref 135–145)

## 2017-09-26 MED ORDER — FENTANYL CITRATE (PF) 100 MCG/2ML IJ SOLN
50.0000 ug | INTRAMUSCULAR | Status: DC | PRN
  Administered 2017-09-26: 50 ug via INTRAVENOUS
  Filled 2017-09-26: qty 2

## 2017-09-26 MED ORDER — LORAZEPAM 2 MG/ML IJ SOLN: 0.5000 mg | INTRAMUSCULAR | Status: DC | PRN

## 2017-09-26 MED ORDER — HYDROMORPHONE HCL 1 MG/ML IJ SOLN
0.5000 mg | INTRAMUSCULAR | Status: DC | PRN
  Administered 2017-09-26 (×2): 1 mg via INTRAVENOUS
  Administered 2017-09-26: 0.5 mg via INTRAVENOUS
  Administered 2017-09-27 (×5): 1 mg via INTRAVENOUS
  Filled 2017-09-26 (×5): qty 1
  Filled 2017-09-26: qty 0.5
  Filled 2017-09-26 (×3): qty 1

## 2017-09-26 MED ORDER — HYDROMORPHONE HCL 1 MG/ML IJ SOLN
0.2500 mg | Freq: Four times a day (QID) | INTRAMUSCULAR | Status: DC
  Filled 2017-09-26: qty 0.5

## 2017-09-26 NOTE — Consult Note (Signed)
Consultation Note Date: 09/26/2017   Patient Name: Adam Weeks.  DOB: 1927-02-25  MRN: [id number]  Age / Sex: 83 y.o., male  PCP: Plotnikov, Evie Lacks, MD Referring Physician: Velvet Bathe, MD  Reason for Consultation: Establishing goals of care, Hospice Evaluation, Non pain symptom management, Pain control and Psychosocial/spiritual support  HPI/Patient Profile: 83 y.o. male  with past medical history of TIA, hypertension, coronary artery disease status post CABG, AVR status post pig valve, chronic kidney disease stage III, chronic back pain on opioids for greater than 5 years admitted on 09/05/2017 hip pain. Patient was last known well at approximately 5 PM and was found down onn his driveway. He had expressive and receptive aphasia, severe pain to his right hip and right elbow. CT of the head was suspicious for left cerebrum acute stroke. Patient was not given TPA due to prior MRI in 2015 which showed remote hemorrhage right cerebellum as well as acute traumatic injury.  Further assessment revealed that he has sustained a right hip fracture. Patient underwent right hemi-arthroplasty on 09/01/2017. Later on 09/13/2017 rapid response was called to assess patient for A. fib, mental status changes. Per chart review, while rapid response was on route to see patient, a CODE BLUE was called on patient. Patient was hypotensive, started on levo fed and intubated. He has since been extubated. Patient's renal function has continued to decline and a hemodialysis catheter was placed  Patient has been clearly verbalizing to nursing staff, nephrology, chaplaincy service and other medical providers that he is "tired"; that he does not want to pursue hemodialysis (he has shared this with cardiology as well as nephrology, chaplaincy).  He has even verbalized wanting to die because he feels so badly  Consults ordered for symptom  management as well as goals of care  Clinical Assessment and Goals of Care: Met with patient's son, Adam Weeks who is his healthcare power of attorney and significant other of 18 years Adam Weeks. Adam Weeks shares with me that even prior to this event happening his father has always told him that he would not want to go through aggressive measures such as intubation or CPR or hemodialysis. His significant other is supportive of whatever decisions Emmitt makes but she has been very conflicted because she doesn't want to lose him, particularly   wanting him to pursue dialysis.  This evening when I went to speak to [name] he was in such distress both from unmanaged pain, as well as being visibly short of breath that he was unable to articulate how he was feeling about goals of care. He did share that he just feels so badly he doesn't know how much more of this he can take.  Also elicited from patient as well as his son Adam Weeks, patient has been on opioids for approximately 5 or more years at fairly high doses including hydromorphone 4 mg tablets averaging 3-4 tabs daily, as well as benzodiazepines. Scott verbalizes that he is seeing his father's quality of life decline now for the past year because  of pain. His significant other, Adam Weeks, also articulates that he has been unable to do the things that he normally likes to do and that is why he was "out washing the car when he shouldn't have been" when he sustained his hip fracture.  Patient can still speak for himself ,although this evening he appears in too much physical distress to have any extended goals of care conversation. His healthcare proxy however is his son Adam Weeks    SUMMARY OF RECOMMENDATIONS   We'll make patient a full DO NOT RESUSCITATE; confirmed this with Adam Weeks as well as patient. No BiPAP We will adjust patient's pain medication in setting of new acute illness as well as chronic pain and opioid dependency I will plan on  meeting with patient and family again  on 09/27/2017 at 2 PM to address goals of care specifically hemodialysis, residential hospice. Hopefully patient's other son [name] will be present at this meeting I did introduce the concept of hospice specifically residential hospice to [place] this evening as an option moving forward should he desire comfort care Code Status/Advance Care Planning:  DNR    Symptom Management:   Pain: Patient is struggling to take oral medication and has been refusing oral medications today thus we'll stop MS Contin. Also nursing is reporting minimal benefit with fentanyl IV. Patient was on Dilaudid at home, and as Dilaudid is well-tolerated in the setting of renal failure we'll start scheduled Dilaudid IV 0.25 mg every 6 hours ( MS04 PO 30 mg is equal to 1.5 mg IV dilaudid), 0.5-1 mg every 2 as needed for breakthrough pain  Dyspnea: Continue with O2 prednisone nebulizer treatments as needed as well as opioids  Anxiety: As it's not clear whether he has been benzo  dependent as he has been with opioids, will order Ativan 0.5 mg every 4 hours as needed. Monitor for need for scheduled dosing  Palliative Prophylaxis:   Aspiration, Bowel Regimen, Delirium Protocol, Eye Care, Frequent Pain Assessment, Oral Care and Turn Reposition   Psycho-social/Spiritual:   Desire for further Chaplaincy support:yes  Additional Recommendations: Grief/Bereavement Support and Referral to [employer]   Prognosis:   < 2 weeks in the setting of coronary artery disease, status post PEA arrest, renal failure (patient thus far is refusing hemodialysis) stroke, debility, hip fx  Discharge Planning: To Be Determined      Primary Diagnoses: Present on Admission: . TIA (transient ischemic attack) . Closed right hip fracture, initial encounter ([place]) . HTN (hypertension)   I have reviewed the medical record, interviewed the patient and family, and examined the patient. The following aspects are  pertinent.  Past Medical History:  Diagnosis Date  . CAD (coronary artery disease)   . Chronic kidney disease    stones  . GERD (gastroesophageal reflux disease)   . History of gout 09/28/2014   2014  . Hyperlipidemia   . Hypertension   . Osteoporosis   . Stroke [facility])    TIA   Social History   Social History  . Marital status: Divorced    Spouse name: N/A  . Number of children: N/A  . Years of education: N/A   Social History Main Topics  . Smoking status: Former Research scientist (life sciences)  . Smokeless tobacco: Never Used  . Alcohol use No  . Drug use: No  . Sexual activity: Not Currently   Other Topics Concern  . None   Social History Narrative  . None   Family History  Problem Relation Age of  Onset  . Cancer Mother 36       breast ca  . Heart disease Father        CAD   Scheduled Meds: . allopurinol  100 mg Oral Daily  . aspirin  81 mg Oral BID  . atorvastatin  40 mg Oral Daily  . carvedilol  12.5 mg Oral BID WC  . furosemide  80 mg Intravenous Daily  . hydrALAZINE  25 mg Oral Q8H  .  HYDROmorphone (DILAUDID) injection  0.25 mg Intravenous Q6H  . insulin aspart  0-9 Units Subcutaneous Q4H  . pantoprazole  40 mg Oral BID  . predniSONE  10 mg Oral Q breakfast   Continuous Infusions: PRN Meds:.acetaminophen **OR** acetaminophen, hydrALAZINE, HYDROmorphone (DILAUDID) injection, lidocaine, LORazepam, menthol-cetylpyridinium **OR** phenol, ondansetron **OR** ondansetron (ZOFRAN) IV, RESOURCE THICKENUP CLEAR Medications Prior to Admission:  Prior to Admission medications   Medication Sig Start Date End Date Taking? Authorizing Provider  acetaminophen (TYLENOL) 500 MG tablet Take 1,000 mg by mouth every 6 (six) hours as needed for mild pain.   Yes [provider]  allopurinol (ZYLOPRIM) 100 MG tablet Take 1 tablet (100 mg total) by mouth daily. 06/05/14  Yes Short, Noah Delaine, MD  aspirin 81 MG tablet Take 81 mg by mouth 2 (two) times daily.    Yes [provider]   atorvastatin (LIPITOR) 40 MG tablet Take 1 tablet (40 mg total) by mouth daily. 06/05/14  Yes Short, Noah Delaine, MD  carvedilol (COREG) 12.5 MG tablet Take 12.5 mg by mouth 2 (two) times daily with a meal.   Yes [provider]  Cholecalciferol 1000 UNITS tablet Take 1,000 Units by mouth daily.     Yes [provider]  docusate sodium (COLACE) 100 MG capsule Take 200 mg by mouth 2 (two) times daily.   Yes [provider]  finasteride (PROSCAR) 5 MG tablet Take 5 mg by mouth daily.   Yes [provider]  HYDROmorphone (DILAUDID) 4 MG tablet Take 0.5-1 tablets (2-4 mg total) by mouth every 6 (six) hours as needed for severe pain. 09/01/17  Yes Plotnikov, Evie Lacks, MD  lidocaine (LIDODERM) 5 % Place 1 patch onto the skin as needed (for pain). Remove & Discard patch within 12 hours or as directed by MD    Yes [provider]  Magnesium 200 MG TABS 1 po qd Patient taking differently: Take 1 each by mouth daily.  10/23/14  Yes Plotnikov, Evie Lacks, MD  Multiple Vitamins-Minerals (OCUVITE PRESERVISION) TABS Take 2 tablets by mouth 2 (two) times daily.     Yes [provider]  omeprazole (PRILOSEC) 20 MG capsule Take 20 mg by mouth 2 (two) times daily before a meal.    Yes [provider]  predniSONE (DELTASONE) 10 MG tablet 1 po qd Patient taking differently: Take 10 mg by mouth daily with breakfast.  09/01/17  Yes Plotnikov, Evie Lacks, MD  torsemide (DEMADEX) 20 MG tablet Take 1-2 tablets (20-40 mg total) by mouth daily. Patient taking differently: Take 20 mg by mouth daily.  04/24/17  Yes Plotnikov, Evie Lacks, MD  venlafaxine XR (EFFEXOR-XR) 75 MG 24 hr capsule Take 225 mg by mouth daily with breakfast.   Yes [provider]   Allergies  Allergen Reactions  . Gabapentin     jumpy   Review of Systems  Unable to perform ROS: Severe respiratory distress    Physical Exam  Constitutional:  Acutely ill appearing elderly man visibly  short of breath and  in pain  HENT:  Head: Normocephalic and atraumatic.  Cardiovascular:  Irregular  Pulmonary/Chest:  Increased work of breathing at rest  Musculoskeletal: Normal range of motion.  Neurological: He is alert.  Skin: Skin is warm and dry.  Extensive bruising noted to chest and arms  Psychiatric:  Patient verbalizing feeling completely overwhelmed, in severe pain  Nursing note and vitals reviewed.   Vital Signs: BP 132/66   Pulse 92   Temp 98.1 F (36.7 C) (Oral)   Resp (!) 23   Ht 6' (1.829 m)   Wt 86.1 kg (189 lb 13.1 oz)   SpO2 100%   BMI 25.74 kg/m  Pain Assessment: 0-10   Pain Score: 4    SpO2: SpO2: 100 % O2 Device:SpO2: 100 % O2 Flow Rate: .O2 Flow Rate (L/min): 2 L/min  IO: Intake/output summary:  Intake/Output Summary (Last 24 hours) at 09/26/17 1904 Last data filed at 09/26/17 1600  Gross per 24 hour  Intake                0 ml  Output             2200 ml  Net            -2200 ml    LBM: Last BM Date:  (pta?) Baseline Weight: Weight: 75.4 kg (166 lb 3.6 oz) Most recent weight: Weight: 86.1 kg (189 lb 13.1 oz)     Palliative Assessment/Data:   Flowsheet Rows     Most Recent Value  Intake Tab  Referral Department  Hospitalist  Unit at Time of Referral  Intermediate Care Unit  Palliative Care Primary Diagnosis  Cardiac  Date Notified  09/26/17  Palliative Care Type  New Palliative care  Reason for referral  Pain, Non-pain Symptom, Clarify Goals of Care, Counsel Regarding Hospice  Date of Admission  09/12/17  Date first seen by Palliative Care  09/26/17  # of days Palliative referral response time  0 Day(s)  # of days IP prior to Palliative referral  14  Clinical Assessment  Palliative Performance Scale Score  30%  Pain Max last 24 hours  Not able to report  Pain Min Last 24 hours  Not able to report  Dyspnea Max Last 24 Hours  Not able to report  Dyspnea Min Last 24 hours  Not able to report  Nausea Max Last 24 Hours  Not able  to report  Nausea Min Last 24 Hours  Not able to report  Anxiety Max Last 24 Hours  Not able to report  Anxiety Min Last 24 Hours  Not able to report  Other Max Last 24 Hours  Not able to report  Psychosocial & Spiritual Assessment  Palliative Care Outcomes  Patient/Family meeting held?  Yes  Who was at the meeting?  pt, pt's SO, pt's son Ledon Snare  Palliative Care Outcomes  Improved non-pain symptom therapy, Improved pain interventions, Provided psychosocial or spiritual support, Counseled regarding hospice  Patient/Family wishes: Interventions discontinued/not started   Mechanical Ventilation, BiPAP, Tube feedings/TPN, NIPPV, Trach, PEG  Palliative Care follow-up planned  Yes, Facility      Time In: 1800 Time Out: 1920 Time Total: 80 min Greater than 50%  of this time was spent counseling and coordinating care related to the above assessment and plan.  Signed by: Dory Horn, NP   Please contact Palliative Medicine Team phone at [phone] for questions and concerns.  For individual provider: See Shea Evans

## 2017-09-26 NOTE — Progress Notes (Signed)
Patient stated he does not want,refuses to being stuck for CBG monitoring.  Family at bedside agrees.  Nursing technician notified.
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
## 2017-09-26 NOTE — Progress Notes (Signed)
PROGRESS NOTE    Adam Weeks.  ZOX:[id number] DOB: 1927-05-26 DOA: 09/08/2017 PCP: [name], MD    Brief Narrative:  83 YR OLD MALE PRESENTED ON 09/16/2017 S/P FALL, a displaced right femoral neck fracture,  Right hip hemiarthroplasty. Noted to Afib RVR received Metoprolol 2.5mg  bradycardic s/p PEA arrest 9/22  Assessment & Plan:   Principal Problem:   Closed right hip fracture, initial encounter (HCC) - Ortho managing - Consulted palliative for assistance with symptom control.  - PT on board.  AKI - nephrology on board and managing. Worsening. Patient and son are calling for palliative care. I have consulted palliative at this point.  Active Problems:   Current chronic use of systemic steroids -  Continue prednisone at current dose    HTN (hypertension) - carvedilol     Syncope and collapse   Aphasia - Pt communicating with examiner today.    Bradycardia - resolved    Cardiac arrest (HCC) - Pt alert and awake and responding appropriately to questions    S/P CABG (coronary artery bypass graft) - continue statin and aspirin - current chest pain most likely due to chest compressions    Acute respiratory failure (HCC) - resolved, patient extubated. - supplemental oxygen as needed.     Paroxysmal atrial fibrillation (HCC) - Continue B blocker - pt is on aspirin currently, suspect with falls he is not candidate for anticoagulation   DVT prophylaxis: SCD's Code Status: Partial Family Communication: d/c son at bedside. Disposition Plan: consulted palliative for goals of care and symptom management.   Consultants:   Nephrology  Cardiology  Orthopedic  Neurology  Procedures: POD6 s/p right hip hemi   Antimicrobials: None   Subjective:   Objective: Vitals:   09/26/17 1000 09/26/17 1100 09/26/17 1153 09/26/17 1200  BP: 134/72 (!) 95/56  (!) 110/59  Pulse: 71 85  76  Resp: (!) 23 17  (!) 22  Temp:   98.1 F (36.7 C)   TempSrc:   Oral    SpO2: 100% 100%  100%  Weight:      Height:        Intake/Output Summary (Last 24 hours) at 09/26/17 1217 Last data filed at 09/26/17 1000  Gross per 24 hour  Intake                0 ml  Output             1670 ml  Net            -1670 ml   Filed Weights   09/08/2017 0457 09/23/17 0500 09/23/17 1000  Weight: 77 kg (169 lb 12.1 oz) 86.1 kg (189 lb 13.1 oz) 86.1 kg (189 lb 13.1 oz)    Examination:  General exam: Appears calm and comfortable, in nad. Respiratory system: Clear to auscultation. Respiratory effort normal. Equal chest rise. Cardiovascular system: S1 & S2 heard, RRR. No JVD, murmurs, rubs Gastrointestinal system: Abdomen is nondistended, soft and nontender.  Central nervous system: Alert and oriented. No focal neurological deficits. Extremities: warm + pulses Skin: No rashes, lesions or ulcers, on limited exam. Psychiatry:  Mood & affect appropriate.     Data Reviewed: I have personally reviewed following labs and imaging studies  CBC:  Recent Labs Lab 09/21/17 0519 09/22/17 0503 09/22/17 2128 09/23/17 0548 09/24/17 0335 09/25/17 0409 09/26/17 0343  WBC 22.5* 15.7*  --  11.5* 12.0* 11.7* 12.3*  NEUTROABS 19.8*  --   --   --   --   --   --  HGB 8.3* 7.1* 7.3* 7.6* 8.2* 8.8* 8.8*  HCT 25.5* 21.9* 22.3* 22.9* 24.4* 26.6* 26.8*  MCV 97.7 99.1  --  96.2 93.8 94.0 94.0  PLT 95* 100*  --  129* 129* 196 240   Basic Metabolic Panel:  Recent Labs Lab 09/20/17 1846 09/21/17 0519 09/21/17 0950 09/22/17 0503 09/22/17 0730 09/22/17 1855 09/23/17 0548 09/24/17 0335 09/26/17 0343  NA  --  133*  --   --  134* 134* 136 133* 130*  K  --  4.9  --   --  5.0 4.8 4.7 4.4 4.7  CL  --  102  --   --  104 102 100* 98* 98*  CO2  --  19*  --   --  18* 20* 19* 19* 19*  GLUCOSE  --  133*  --   --  156* 126* 148* 120* 109*  BUN  --  64*  --   --  84* 96* 104* 112* 133*  CREATININE  --  3.87*  --   --  4.83* 5.35* 5.68* 5.72* 6.19*  CALCIUM  --  7.7*  --   --  7.2*  7.3* 7.2* 7.7* 8.6*  MG 2.6* 2.4 2.4 2.5*  --  2.5*  --   --   --   PHOS 5.6* 5.6* 6.4* 6.5*  --  6.4*  --  4.7*  --    GFR: Estimated Creatinine Clearance: 9.1 mL/min (A) (by C-G formula based on SCr of 6.19 mg/dL (H)). Liver Function Tests:  Recent Labs Lab 09/24/17 0335  ALBUMIN 2.1*   No results for input(s): LIPASE, AMYLASE in the last 168 hours. No results for input(s): AMMONIA in the last 168 hours. Coagulation Profile: No results for input(s): INR, PROTIME in the last 168 hours. Cardiac Enzymes:  Recent Labs Lab 09/20/17 0908 09/21/17 0950  TROPONINI 1.88* 0.48*   BNP (last 3 results)  Recent Labs  04/13/17 1552  PROBNP 399.0*   HbA1C: No results for input(s): HGBA1C in the last 72 hours. CBG:  Recent Labs Lab 09/25/17 1930 09/25/17 2304 09/26/17 0339 09/26/17 0729 09/26/17 1153  GLUCAP 116* 131* 111* 103* 129*   Lipid Profile: No results for input(s): CHOL, HDL, LDLCALC, TRIG, CHOLHDL, LDLDIRECT in the last 72 hours. Thyroid Function Tests: No results for input(s): TSH, T4TOTAL, FREET4, T3FREE, THYROIDAB in the last 72 hours. Anemia Panel: No results for input(s): VITAMINB12, FOLATE, FERRITIN, TIBC, IRON, RETICCTPCT in the last 72 hours. Sepsis Labs:  Recent Labs Lab 10-03-2017 2249 09/20/17 0219  LATICACIDVEN 6.5* 2.6*    Recent Results (from the past 240 hour(s))  MRSA PCR Screening     Status: Abnormal   Collection Time: 09/20/17 12:10 AM  Result Value Ref Range Status   MRSA by PCR POSITIVE (A) NEGATIVE Final    Comment:        The GeneXpert MRSA Assay (FDA approved for NASAL specimens only), is one component of a comprehensive MRSA colonization surveillance program. It is not intended to diagnose MRSA infection nor to guide or monitor treatment for MRSA infections. RESULT CALLED TO, READ BACK BY AND VERIFIED WITH: WILLIS,S RN [id number] AT 0324 SKEEN,P   Culture, blood (Routine X 2) w Reflex to ID Panel     Status: None    Collection Time: 09/20/17  7:57 AM  Result Value Ref Range Status   Specimen Description BLOOD LEFT ANTECUBITAL  Final   Special Requests   Final    BOTTLES DRAWN AEROBIC ONLY Blood Culture  adequate volume   Culture NO GROWTH 5 DAYS  Final   Report Status 09/25/2017 FINAL  Final  Culture, blood (Routine X 2) w Reflex to ID Panel     Status: None   Collection Time: 09/20/17  8:03 AM  Result Value Ref Range Status   Specimen Description BLOOD BLOOD LEFT HAND  Final   Special Requests   Final    BOTTLES DRAWN AEROBIC ONLY Blood Culture adequate volume   Culture NO GROWTH 5 DAYS  Final   Report Status 09/25/2017 FINAL  Final  Urine Culture     Status: None   Collection Time: 09/20/17 10:32 AM  Result Value Ref Range Status   Specimen Description URINE, CATHETERIZED  Final   Special Requests NONE  Final   Culture NO GROWTH  Final   Report Status 09/21/2017 FINAL  Final         Radiology Studies: Dg Chest Port 1v Same Day  Result Date: 09/25/2017 CLINICAL DATA:  83 year old male with shortness of breath. EXAM: PORTABLE CHEST 1 VIEW COMPARISON:  09/24/2017 FINDINGS: Cardiomegaly and cardiac surgical changes again noted. A right IJ central venous catheter is noted with tip overlying the superior cavoatrial junction. Increasing interstitial opacities bilaterally noted likely representing edema. This is a low volume film with bibasilar atelectasis again identified. No evidence of pneumothorax. IMPRESSION: Increasing interstitial opacities likely representing interstitial edema. Low volume film with bibasilar atelectasis. Electronically Signed   By: Harmon Pier M.D.   On: 09/25/2017 15:04        Scheduled Meds: . allopurinol  100 mg Oral Daily  . aspirin  81 mg Oral BID  . atorvastatin  40 mg Oral Daily  . carvedilol  12.5 mg Oral BID WC  . cholecalciferol  1,000 Units Oral Daily  . docusate sodium  100 mg Oral Daily  . furosemide  80 mg Intravenous Daily  . hydrALAZINE  25 mg Oral  Q8H  . insulin aspart  0-9 Units Subcutaneous Q4H  . morphine  15 mg Oral Q12H  . multivitamin  1 tablet Oral Daily  . pantoprazole  40 mg Oral BID  . predniSONE  10 mg Oral Q breakfast   Continuous Infusions:   LOS: 8 days    Time spent: > 35 minutes  Penny Pia, MD [facility] Pager [phone]  If 7PM-7AM, please contact night-coverage [email or web address] Password [facility] 09/26/2017, 12:17 PM

## 2017-09-27 DIAGNOSIS — R52 Pain, unspecified: Secondary | ICD-10-CM

## 2017-09-27 LAB — GLUCOSE, CAPILLARY
GLUCOSE-CAPILLARY: 126 mg/dL — AB (ref 65–99)
GLUCOSE-CAPILLARY: 98 mg/dL (ref 65–99)
Glucose-Capillary: 120 mg/dL — ABNORMAL HIGH (ref 65–99)

## 2017-09-27 LAB — BASIC METABOLIC PANEL
Anion gap: 13 (ref 5–15)
BUN: 133 mg/dL — AB (ref 6–20)
CALCIUM: 8.7 mg/dL — AB (ref 8.9–10.3)
CHLORIDE: 99 mmol/L — AB (ref 101–111)
CO2: 20 mmol/L — ABNORMAL LOW (ref 22–32)
CREATININE: 6.18 mg/dL — AB (ref 0.61–1.24)
GFR calc Af Amer: 8 mL/min — ABNORMAL LOW (ref >60)
GFR calc non Af Amer: 7 mL/min — ABNORMAL LOW (ref >60)
Glucose, Bld: 132 mg/dL — ABNORMAL HIGH (ref 65–99)
Potassium: 4.6 mmol/L (ref 3.5–5.1)
SODIUM: 132 mmol/L — AB (ref 135–145)

## 2017-09-27 LAB — CBC
HEMATOCRIT: 26.4 % — AB (ref 39.0–52.0)
HEMOGLOBIN: 8.9 g/dL — AB (ref 13.0–17.0)
MCH: 31.7 pg (ref 26.0–34.0)
MCHC: 33.7 g/dL (ref 30.0–36.0)
MCV: 94 fL (ref 78.0–100.0)
Platelets: 295 10*3/uL (ref 150–400)
RBC: 2.81 MIL/uL — AB (ref 4.22–5.81)
RDW: 15.1 % (ref 11.5–15.5)
WBC: 13 10*3/uL — AB (ref 4.0–10.5)

## 2017-09-27 MED ORDER — HYDROMORPHONE HCL 1 MG/ML IJ SOLN
0.5000 mg | INTRAMUSCULAR | Status: DC
  Administered 2017-09-27: 0.5 mg via INTRAVENOUS
  Filled 2017-09-27: qty 0.5

## 2017-09-27 MED ORDER — GLYCOPYRROLATE 0.2 MG/ML IJ SOLN
0.2000 mg | Freq: Four times a day (QID) | INTRAMUSCULAR | Status: DC
  Administered 2017-09-28 – 2017-09-29 (×4): 0.2 mg via INTRAVENOUS
  Filled 2017-09-27 (×3): qty 1

## 2017-09-27 MED ORDER — GLYCOPYRROLATE 0.2 MG/ML IJ SOLN
0.2000 mg | Freq: Four times a day (QID) | INTRAMUSCULAR | Status: DC | PRN
  Filled 2017-09-27: qty 2

## 2017-09-27 MED ORDER — SODIUM CHLORIDE 0.9 % IV SOLN
2.0000 mg/h | INTRAVENOUS | Status: DC
  Administered 2017-09-27: 0.5 mg/h via INTRAVENOUS
  Administered 2017-09-30: 2 mg/h via INTRAVENOUS
  Filled 2017-09-27 (×2): qty 5

## 2017-09-27 MED ORDER — LORAZEPAM 2 MG/ML IJ SOLN
0.5000 mg | INTRAMUSCULAR | Status: DC | PRN
  Administered 2017-09-27 – 2017-09-29 (×4): 0.5 mg via INTRAVENOUS
  Filled 2017-09-27 (×4): qty 1

## 2017-09-27 MED ORDER — SENNA 8.6 MG PO TABS
1.0000 | ORAL_TABLET | Freq: Two times a day (BID) | ORAL | Status: DC
  Filled 2017-09-27 (×2): qty 1

## 2017-09-27 MED ORDER — HYDROMORPHONE BOLUS VIA INFUSION
1.0000 mg | INTRAVENOUS | Status: DC | PRN
  Administered 2017-09-28 – 2017-09-30 (×9): 1 mg via INTRAVENOUS
  Filled 2017-09-27: qty 1

## 2017-09-27 NOTE — Progress Notes (Signed)
SLP Cancellation Note  Patient Details Name: Adam Weeks. MRN: [id number] DOB: March 31, 1927   Cancelled treatment:       Reason Eval/Treat Not Completed: Other (comment) . Pt now comfort care; orders canceled per palliative. SLP to s/o.  Arlana Lindau 09/27/2017, 4:29 PM

## 2017-09-27 NOTE — Progress Notes (Signed)
PROGRESS NOTE    [name].  EAV:[id number] DOB: 09/25/1927 DOA: 09/24/17 PCP: [name], MD    Brief Narrative:  83 YR OLD MALE PRESENTED ON 09/24/2017 S/P FALL, a displaced right femoral neck fracture,  Right hip hemiarthroplasty. Noted to Afib RVR received Metoprolol 2.5mg  bradycardic s/p PEA arrest 9/22  Assessment & Plan:   Principal Problem:   Closed right hip fracture, initial encounter (HCC) - Ortho managing - Palliative care team consulted and assisting  AKI - nephrology on board and managing. Worsening. Patient and son are calling for palliative care. I have consulted palliative at this point.  - Last serum creatinine at 6.1 and patient does not wish for dialysis. Palliative on board.   Active Problems:   Current chronic use of systemic steroids -  Continue prednisone at current dose    HTN (hypertension) - carvedilol     Syncope and collapse   Aphasia - Pt communicating with examiner today.    Bradycardia - resolved    Cardiac arrest (HCC) - Pt alert and awake and responding appropriately to questions    S/P CABG (coronary artery bypass graft) - continue statin and aspirin - current chest pain most likely due to chest compressions    Acute respiratory failure (HCC) - resolved, patient extubated. - supplemental oxygen as needed.     Paroxysmal atrial fibrillation (HCC) - Continue B blocker - pt is on aspirin currently, suspect with falls he is not candidate for anticoagulation   DVT prophylaxis: SCD's Code Status: Partial Family Communication: d/c son at bedside yesterday. Disposition Plan: consulted palliative for goals of care and symptom management.   Consultants:   Nephrology  Cardiology  Orthopedic  Neurology  Procedures: POD6 s/p right hip hemi   Antimicrobials: None   Subjective: Pt has no new complaints, no acute issues overnight.  Objective: Vitals:   09/27/17 0800 09/27/17 0900 09/27/17 1000 09/27/17 1200   BP: 138/89  119/67 (!) 126/58  Pulse: 70 78 (!) 47 92  Resp: Temp:      TempSrc:      SpO2: 96% 99% 97% 99%  Weight:      Height:        Intake/Output Summary (Last 24 hours) at 09/27/17 1255 Last data filed at 09/27/17 1200  Gross per 24 hour  Intake              180 ml  Output             1285 ml  Net            -1105 ml   Filed Weights   08/31/2017 0457 09/23/17 0500 09/23/17 1000  Weight: 77 kg (169 lb 12.1 oz) 86.1 kg (189 lb 13.1 oz) 86.1 kg (189 lb 13.1 oz)    Examination:  General exam: Appears calm and comfortable, in nad. Respiratory system: Clear to auscultation. Respiratory effort normal. Equal chest rise. Cardiovascular system: S1 & S2 heard, RRR. No JVD, murmurs, rubs Gastrointestinal system: Abdomen is nondistended, soft and nontender.  Central nervous system: Alert and oriented. No focal neurological deficits. Extremities: warm + pulses Skin: No rashes, lesions or ulcers, on limited exam. Psychiatry:  Mood & affect appropriate.     Data Reviewed: I have personally reviewed following labs and imaging studies  CBC:  Recent Labs Lab 09/21/17 0519  09/23/17 0548 09/24/17 0335 09/25/17 0409 09/26/17 0343 09/27/17 0413  WBC 22.5*  < > 11.5* 12.0* 11.7* 12.3*  13.0*  NEUTROABS 19.8*  --   --   --   --   --   --   HGB 8.3*  < > 7.6* 8.2* 8.8* 8.8* 8.9*  HCT 25.5*  < > 22.9* 24.4* 26.6* 26.8* 26.4*  MCV 97.7  < > 96.2 93.8 94.0 94.0 94.0  PLT 95*  < > 129* 129* 196 240 295  < > = values in this interval not displayed. Basic Metabolic Panel:  Recent Labs Lab 09/20/17 1846 09/21/17 6962 09/21/17 0950 09/22/17 0503  09/22/17 1855 09/23/17 9528 09/24/17 0335 09/26/17 0343 09/27/17 0413  NA  --  133*  --   --   < > 134* 136 133* 130* 132*  K  --  4.9  --   --   < > 4.8 4.7 4.4 4.7 4.6  CL  --  102  --   --   < > 102 100* 98* 98* 99*  CO2  --  19*  --   --   < > 20* 19* 19* 19* 20*  GLUCOSE  --  133*  --   --   < > 126* 148* 120*  109* 132*  BUN  --  64*  --   --   < > 96* 104* 112* 133* 133*  CREATININE  --  3.87*  --   --   < > 5.35* 5.68* 5.72* 6.19* 6.18*  CALCIUM  --  7.7*  --   --   < > 7.3* 7.2* 7.7* 8.6* 8.7*  MG 2.6* 2.4 2.4 2.5*  --  2.5*  --   --   --   --   PHOS 5.6* 5.6* 6.4* 6.5*  --  6.4*  --  4.7*  --   --   < > = values in this interval not displayed. GFR: Estimated Creatinine Clearance: 9.1 mL/min (A) (by C-G formula based on SCr of 6.18 mg/dL (H)). Liver Function Tests:  Recent Labs Lab 09/24/17 0335  ALBUMIN 2.1*   No results for input(s): LIPASE, AMYLASE in the last 168 hours. No results for input(s): AMMONIA in the last 168 hours. Coagulation Profile: No results for input(s): INR, PROTIME in the last 168 hours. Cardiac Enzymes:  Recent Labs Lab 09/21/17 0950  TROPONINI 0.48*   BNP (last 3 results)  Recent Labs  04/13/17 1552  PROBNP 399.0*   HbA1C: No results for input(s): HGBA1C in the last 72 hours. CBG:  Recent Labs Lab 09/26/17 2043 09/26/17 2340 09/27/17 0428 09/27/17 0734 09/27/17 1138  GLUCAP 190* 153* 126* 98 120*   Lipid Profile: No results for input(s): CHOL, HDL, LDLCALC, TRIG, CHOLHDL, LDLDIRECT in the last 72 hours. Thyroid Function Tests: No results for input(s): TSH, T4TOTAL, FREET4, T3FREE, THYROIDAB in the last 72 hours. Anemia Panel: No results for input(s): VITAMINB12, FOLATE, FERRITIN, TIBC, IRON, RETICCTPCT in the last 72 hours. Sepsis Labs: No results for input(s): PROCALCITON, LATICACIDVEN in the last 168 hours.  Recent Results (from the past 240 hour(s))  MRSA PCR Screening     Status: Abnormal   Collection Time: 09/20/17 12:10 AM  Result Value Ref Range Status   MRSA by PCR POSITIVE (A) NEGATIVE Final    Comment:        The GeneXpert MRSA Assay (FDA approved for NASAL specimens only), is one component of a comprehensive MRSA colonization surveillance program. It is not intended to diagnose MRSA infection nor to guide or monitor  treatment for MRSA infections. RESULT CALLED TO, READ  BACK BY AND VERIFIED WITH: WILLIS,S RN [id number] AT 0324 SKEEN,P   Culture, blood (Routine X 2) w Reflex to ID Panel     Status: None   Collection Time: 09/20/17  7:57 AM  Result Value Ref Range Status   Specimen Description BLOOD LEFT ANTECUBITAL  Final   Special Requests   Final    BOTTLES DRAWN AEROBIC ONLY Blood Culture adequate volume   Culture NO GROWTH 5 DAYS  Final   Report Status 09/25/2017 FINAL  Final  Culture, blood (Routine X 2) w Reflex to ID Panel     Status: None   Collection Time: 09/20/17  8:03 AM  Result Value Ref Range Status   Specimen Description BLOOD BLOOD LEFT HAND  Final   Special Requests   Final    BOTTLES DRAWN AEROBIC ONLY Blood Culture adequate volume   Culture NO GROWTH 5 DAYS  Final   Report Status 09/25/2017 FINAL  Final  Urine Culture     Status: None   Collection Time: 09/20/17 10:32 AM  Result Value Ref Range Status   Specimen Description URINE, CATHETERIZED  Final   Special Requests NONE  Final   Culture NO GROWTH  Final   Report Status 09/21/2017 FINAL  Final     Radiology Studies: Dg Chest Port 1v Same Day  Result Date: 09/25/2017 CLINICAL DATA:  83 year old male with shortness of breath. EXAM: PORTABLE CHEST 1 VIEW COMPARISON:  09/24/2017 FINDINGS: Cardiomegaly and cardiac surgical changes again noted. A right IJ central venous catheter is noted with tip overlying the superior cavoatrial junction. Increasing interstitial opacities bilaterally noted likely representing edema. This is a low volume film with bibasilar atelectasis again identified. No evidence of pneumothorax. IMPRESSION: Increasing interstitial opacities likely representing interstitial edema. Low volume film with bibasilar atelectasis. Electronically Signed   By: Harmon Pier M.D.   On: 09/25/2017 15:04    Scheduled Meds: . allopurinol  100 mg Oral Daily  . aspirin  81 mg Oral BID  . atorvastatin  40 mg Oral Daily  .  carvedilol  12.5 mg Oral BID WC  . furosemide  80 mg Intravenous Daily  . hydrALAZINE  25 mg Oral Q8H  .  HYDROmorphone (DILAUDID) injection  0.5 mg Intravenous Q4H  . insulin aspart  0-9 Units Subcutaneous Q4H  . pantoprazole  40 mg Oral BID  . predniSONE  10 mg Oral Q breakfast   Continuous Infusions:   LOS: 9 days    Time spent: > 35 minutes  Penny Pia, MD [facility] Pager [phone]  If 7PM-7AM, please contact night-coverage [email or web address] Password [email or web address] 09/27/2017, 12:55 PM

## 2017-09-27 NOTE — Progress Notes (Signed)
Daily Progress Note   Patient Name: Adam Weeks.       Date: 09/27/2017 DOB: [birth year]  Age: 83 y.o. MRN#: [id number] Attending Physician: Penny Pia, MD Primary Care Physician: [name], MD Admit Date: 09/05/2017  Reason for Consultation/Follow-up: Establishing goals of care, Non pain symptom management, Pain control, Psychosocial/spiritual support and Terminal Care  Subjective: Chart reviewed and patient seen . Multiple PRN dilaudid in addition to scheduled dilaudid noted overnight  . Total of 6.5 mg administered since approximately 7 PM 09/26/2017. Patient does tell me he is in less pain. I attempted to elicit goals of care from him directly and he was able to state that he does not want to do dialysis but I fear he is rapidly reaching the point where he can no longer make decisions for himself. Creatinine today is 6.18  Length of Stay: 9  Current Medications: Scheduled Meds:  . allopurinol  100 mg Oral Daily  . aspirin  81 mg Oral BID  . atorvastatin  40 mg Oral Daily  . carvedilol  12.5 mg Oral BID WC  . furosemide  80 mg Intravenous Daily  . hydrALAZINE  25 mg Oral Q8H  .  HYDROmorphone (DILAUDID) injection  0.5 mg Intravenous Q4H  . insulin aspart  0-9 Units Subcutaneous Q4H  . pantoprazole  40 mg Oral BID  . predniSONE  10 mg Oral Q breakfast    Continuous Infusions:   PRN Meds: acetaminophen **OR** acetaminophen, hydrALAZINE, HYDROmorphone (DILAUDID) injection, lidocaine, LORazepam, menthol-cetylpyridinium **OR** phenol, ondansetron **OR** ondansetron (ZOFRAN) IV, RESOURCE THICKENUP CLEAR  Physical Exam  Constitutional:  Acutely ill elderly man; appears to be transitioning towards end-of-life  HENT:  Head: Normocephalic.  Cardiovascular:  Irregular    Pulmonary/Chest:  Increased work of breathing at rest  Neurological:  Lethargic although can answer a few questions, yes or no  Skin:  Cool, mottled  Psychiatric:  No agitation  Nursing note and vitals reviewed.           Vital Signs: BP 138/89 (BP Location: Left Arm)   Pulse 70   Temp (!) 97.4 F (36.3 C) (Oral)   Resp 13   Ht 6' (1.829 m)   Wt 86.1 kg (189 lb 13.1 oz)   SpO2 96%   BMI 25.74 kg/m  SpO2: SpO2: 96 %  O2 Device: O2 Device: Not Delivered O2 Flow Rate: O2 Flow Rate (L/min): 2 L/min  Intake/output summary:  Intake/Output Summary (Last 24 hours) at 09/27/17 0959 Last data filed at 09/27/17 0800  Gross per 24 hour  Intake              140 ml  Output             1665 ml  Net            -1525 ml   LBM: Last BM Date: 09/22/17 Baseline Weight: Weight: 75.4 kg (166 lb 3.6 oz) Most recent weight: Weight: 86.1 kg (189 lb 13.1 oz)       Palliative Assessment/Data:    Flowsheet Rows     Most Recent Value  Intake Tab  Referral Department  Hospitalist  Unit at Time of Referral  Intermediate Care Unit  Palliative Care Primary Diagnosis  Cardiac  Date Notified  09/26/17  Palliative Care Type  New Palliative care  Reason for referral  Pain, Non-pain Symptom, Clarify Goals of Care, Counsel Regarding Hospice  Date of Admission  09/12/17  Date first seen by Palliative Care  09/26/17  # of days Palliative referral response time  0 Day(s)  # of days IP prior to Palliative referral  14  Clinical Assessment  Palliative Performance Scale Score  30%  Pain Max last 24 hours  Not able to report  Pain Min Last 24 hours  Not able to report  Dyspnea Max Last 24 Hours  Not able to report  Dyspnea Min Last 24 hours  Not able to report  Nausea Max Last 24 Hours  Not able to report  Nausea Min Last 24 Hours  Not able to report  Anxiety Max Last 24 Hours  Not able to report  Anxiety Min Last 24 Hours  Not able to report  Other Max Last 24 Hours  Not able to report   Psychosocial & Spiritual Assessment  Palliative Care Outcomes  Patient/Family meeting held?  Yes  Who was at the meeting?  pt, pt's SO, pt's son Emelda Brothers  Palliative Care Outcomes  Improved non-pain symptom therapy, Improved pain interventions, Provided psychosocial or spiritual support, Counseled regarding hospice  Patient/Family wishes: Interventions discontinued/not started   Mechanical Ventilation, BiPAP, Tube feedings/TPN, NIPPV, Trach, PEG  Palliative Care follow-up planned  Yes, Facility      Patient Active Problem List   Diagnosis Date Noted  . Palliative care by specialist   . Paroxysmal atrial fibrillation (HCC)   . Acute respiratory failure (HCC)   . Aphasia   . Bradycardia   . Cardiac arrest (HCC)   . S/P CABG (coronary artery bypass graft)   . HTN (hypertension) 09/21/2017  . Syncope and collapse   . Closed right hip fracture, initial encounter (HCC) Oct 18, 2017  . Current chronic use of systemic steroids 08/27/2017  . Chronic back pain 08/27/2017  . Renal cyst 05/27/2017  . Cellulitis 04/24/2017  . Pain and swelling of lower leg, left 04/13/2017  . Right-sided chest wall pain 04/13/2017  . Dyspnea 01/23/2017  . Memory loss 03/15/2015  . History of gout 09/28/2014  . Involuntary movements 09/28/2014  . Orthostasis 09/28/2014  . Hypertensive renal disease with renal failure 06/16/2014  . PMR (polymyalgia rheumatica) (HCC) 06/16/2014  . CKD (chronic kidney disease) stage 3, GFR 30-59 ml/min 06/04/2014  . Elevated sed rate 06/04/2014  . TIA (transient ischemic attack) 06/03/2014  . Hypomagnesemia 06/03/2014  . Metabolic alkalosis 06/03/2014  .  Dehydration 06/03/2014  . Generalized weakness 06/03/2014  . Well adult exam 12/27/2013  . Fatigue 12/27/2013  . Peripheral edema 12/09/2013  . Arthritis of right shoulder region 12/09/2013  . Right shoulder pain 12/09/2013  . Foot swelling 04/23/2013  . Anemia 04/23/2013  . Hyperuricemia 04/23/2013  .  Diverticulitis 10/08/2012  . Contusion, flank 10/08/2012  . Gall stones 10/01/2012  . Constipation 09/24/2012  . CAD (coronary artery disease) 09/23/2011  . S/P AVR 09/23/2011  . Low back pain 09/23/2011  . Dyslipidemia 09/23/2011  . Osteopenia 09/23/2011  . Preventative health care 09/21/2011    Palliative Care Assessment & Plan   Patient Profile: 83 y.o. male  with past medical history of TIA, hypertension, coronary artery disease status post CABG, AVR status post pig valve, chronic kidney disease stage III, chronic back pain on opioids for greater than 5 years admitted on 10-12-2017 hip pain. Patient was last known well at approximately 5 PM and was found down onn his driveway. He had expressive and receptive aphasia, severe pain to his right hip and right elbow. CT of the head was suspicious for left cerebrum acute stroke. Patient was not given TPA due to prior MRI in 2015 which showed remote hemorrhage right cerebellum as well as acute traumatic injury.  Further assessment revealed that he has sustained a right hip fracture. Patient underwent right hemi-arthroplasty on 09/06/2017. Later on 09/11/2017 rapid response was called to assess patient for A. fib, mental status changes. Per chart review, while rapid response was on route to see patient, a CODE BLUE was called on patient. Patient was hypotensive, started on levo fed and intubated. He has since been extubated. Patient's renal function has continued to decline and a hemodialysis catheter was placed  Patient has been clearly verbalizing to nursing staff, nephrology, chaplaincy service and other medical providers that he is "tired"; that he does not want to pursue hemodialysis (he has shared this with cardiology as well as nephrology, chaplaincy).  He has even verbalized wanting to die because he feels so badly   Assessment: I did attempt to speak to patient this morning in the presence of April, RN. He did verbalize "no" when I  asked him if he wanted to pursue dialysis but was otherwise unable to continue in more detail goals of care discussion such as transitioning out of ICU, residential hospice; generally a discussion on how he views things going with his health.  I did phone his son Lorin Picket, who is his healthcare POA, to update Scott as to my concerns for patient that he is nearing death; that if things were to continue to go the way they are now and with other signs and I'm seeing this morning, such as mottling, he could have a prognosis of just hours to days. Lorin Picket shares with me that he and his brother have been in conflict over goals of care specifically dialysis. Lorin Picket states his brother heard his father say that he would try dialysis last night.  I did speak to Dr. Kathrene Bongo with nephrology who verbalized that patient would be a very poor candidate for even temporary dialysis and that to pursue dialysis at this point would be very risky for patient. Lorin Picket does not want this for his father, and has heard his father articulate even prior to being hospitalized that he would not want to go through aggressive measures such as hemodialysis   Recommendations/Plan: Palliative medicine  to meet with family at 2 PM  [facility] offer comfort care at  that time as well as encouraging a Dilaudid drip  In the interim, based on last night's Dilaudid usage, we'll increase Dilaudid to 0.5 mg every 4 hours on a scheduled basis and continue with as needed 0.5-1 mg every 2 hours for dyspnea or pain Continue with Ativan as needed for anxiety. Monitor for need for scheduled dosing  Addendum 1530: Comfort care. Starting dilaudid continuous infusion; transfer to 6N See updated MAR   Code Status:    Code Status Orders        Start     Ordered   09/26/17 1833  Do not attempt resuscitation (DNR)  Continuous    Question Answer Comment  In the event of cardiac or respiratory ARREST Do not call a "code blue"   In the event of cardiac or  respiratory ARREST Do not perform Intubation, CPR, defibrillation or ACLS   In the event of cardiac or respiratory ARREST Use medication by any route, position, wound care, and other measures to relive pain and suffering. May use oxygen, suction and manual treatment of airway obstruction as needed for comfort.      09/26/17 1832    Code Status History    Date Active Date Inactive Code Status Order ID Comments User Context   09/24/2017  9:52 AM 09/26/2017  6:32 PM Partial Code [id number]  Jeanella Craze, NP Inpatient   09/23/2017  2:58 PM 09/24/2017  9:52 AM Partial Code [id number]  Leslye Peer, MD Inpatient   09/17/2017 11:50 PM 09/23/2017  2:58 PM Full Code [id number]  Hillary Bow, DO ED   06/03/2014  1:13 PM 06/05/2014  3:17 PM Full Code [id number]  Osvaldo Shipper, MD Inpatient       Prognosis:   Hours - Days  Discharge Planning:  Anticipate hospital death versus residential hospice  Care plan was discussed with nephrology  Thank you for allowing the Palliative Medicine Team to assist in the care of this patient.   Time In: 1400 Time Out: 1530 Total Time 90 min Prolonged Time Billed  yes       Greater than 50%  of this time was spent counseling and coordinating care related to the above assessment and plan. Staffed with Dr. Janae Sauce, NP  Please contact Palliative Medicine Team phone at [phone] for questions and concerns.

## 2017-09-28 DIAGNOSIS — I469 Cardiac arrest, cause unspecified: Secondary | ICD-10-CM

## 2017-09-28 MED ORDER — WHITE PETROLATUM GEL
Status: AC
  Filled 2017-09-28: qty 1

## 2017-09-28 NOTE — Progress Notes (Signed)
  Palliative Medicine RN Note:  Rec'd call earlier from family that pt is not comfortable and that Dr Cena Benton told them we would increase the drip. Obtained orders to increase basal rate, and called RN Clayborne Dana, who will give a bolus dose.   Follow up visit: no family present. Pt sleeping, no labored breathing, no grimace. Left note for family that I came to see him. Plan f/u again tomorrow am.  Margret Chance. Kayloni Rocco, RN, BSN, [facility] 09/28/2017 3:06 PM Cell [phone] 8:00-4:00 Monday-Friday Office [phone]

## 2017-09-28 NOTE — Progress Notes (Signed)
PROGRESS NOTE    Adam Weeks.  ZOX:[id number] DOB: April 26, 1927 DOA: 09/07/2017 PCP: [name], MD    Brief Narrative:  83 YR OLD MALE PRESENTED ON 08/30/2017 S/P FALL, a displaced right femoral neck fracture,  Right hip hemiarthroplasty. Noted to Afib RVR received Metoprolol 2.5mg  bradycardic s/p PEA arrest 9/22  Assessment & Plan:   Principal Problem:   Closed right hip fracture, initial encounter [facility]) - Palliative care team consulted plan is for comfort care.  AKI - nephrology on board and managing. Worsening.  - comfort care per palliative team. Pt does not wish for dialysis  Active Problems:   Current chronic use of systemic steroids -  Comfort care    HTN (hypertension) - comfort care measures    Syncope and collapse   Bradycardia - resolved    Cardiac arrest (HCC)   S/P CABG (coronary artery bypass graft) - continue comfort care measures    Acute respiratory failure (HCC) - resolved, patient extubated. - supplemental oxygen as needed.     Paroxysmal atrial fibrillation (HCC) - comfort care measures   DVT prophylaxis: pt in comfort care measures Code Status: Partial Family Communication: d/c daughter in law Disposition Plan: consulted palliative for goals of care and symptom management.   Consultants:   Nephrology  Cardiology  Orthopedic  Neurology  Procedures: POD6 s/p right hip hemi   Antimicrobials: None   Subjective: No new complaints reported.  Objective: Vitals:   09/27/17 1715 09/27/17 2056 09/28/17 0455 09/28/17 1429  BP: (!) 145/80  (!) 157/76 (!) 148/71  Pulse: 96 94 92 94  Resp: Temp: (!) 97.5 F (36.4 C) 98.8 F (37.1 C) 98.2 F (36.8 C) 98.5 F (36.9 C)  TempSrc: Oral Oral Oral Oral  SpO2: 94% 94% 97% 98%  Weight:      Height:        Intake/Output Summary (Last 24 hours) at 09/28/17 1750 Last data filed at 09/28/17 1430  Gross per 24 hour  Intake                0 ml  Output              1150 ml  Net            -1150 ml   Filed Weights   10/11/17 0457 09/23/17 0500 09/23/17 1000  Weight: 77 kg (169 lb 12.1 oz) 86.1 kg (189 lb 13.1 oz) 86.1 kg (189 lb 13.1 oz)    Examination:  General exam: Appears calm and comfortable, resting. Respiratory system: Clear to auscultation. Respiratory effort normal. Equal chest rise. Cardiovascular system: S1 & S2 heard,  No JVD, murmurs, rubs Gastrointestinal system: Abdomen is nondistended, soft and nontender.  Central nervous system:No focal neurological deficits. Extremities: warm + pulses Skin: No rashes, lesions or ulcers, on limited exam. Psychiatry:  Pt resting unable to assess    Data Reviewed: I have personally reviewed following labs and imaging studies  CBC:  Recent Labs Lab 09/23/17 0548 09/24/17 0335 09/25/17 0409 09/26/17 0343 09/27/17 0413  WBC 11.5* 12.0* 11.7* 12.3* 13.0*  HGB 7.6* 8.2* 8.8* 8.8* 8.9*  HCT 22.9* 24.4* 26.6* 26.8* 26.4*  MCV 96.2 93.8 94.0 94.0 94.0  PLT 129* 129* 196 240 295   Basic Metabolic Panel:  Recent Labs Lab 09/22/17 0503  09/22/17 1855 09/23/17 0548 09/24/17 0335 09/26/17 0343 09/27/17 0413  NA  --   < > 134* 136 133* 130* 132*  K  --   < >  4.8 4.7 4.4 4.7 4.6  CL  --   < > 102 100* 98* 98* 99*  CO2  --   < > 20* 19* 19* 19* 20*  GLUCOSE  --   < > 126* 148* 120* 109* 132*  BUN  --   < > 96* 104* 112* 133* 133*  CREATININE  --   < > 5.35* 5.68* 5.72* 6.19* 6.18*  CALCIUM  --   < > 7.3* 7.2* 7.7* 8.6* 8.7*  MG 2.5*  --  2.5*  --   --   --   --   PHOS 6.5*  --  6.4*  --  4.7*  --   --   < > = values in this interval not displayed. GFR: Estimated Creatinine Clearance: 9.1 mL/min (A) (by C-G formula based on SCr of 6.18 mg/dL (H)). Liver Function Tests:  Recent Labs Lab 09/24/17 0335  ALBUMIN 2.1*   No results for input(s): LIPASE, AMYLASE in the last 168 hours. No results for input(s): AMMONIA in the last 168 hours. Coagulation Profile: No results for  input(s): INR, PROTIME in the last 168 hours. Cardiac Enzymes: No results for input(s): CKTOTAL, CKMB, CKMBINDEX, TROPONINI in the last 168 hours. BNP (last 3 results)  Recent Labs  04/13/17 1552  PROBNP 399.0*   HbA1C: No results for input(s): HGBA1C in the last 72 hours. CBG:  Recent Labs Lab 09/26/17 2043 09/26/17 2340 09/27/17 0428 09/27/17 0734 09/27/17 1138  GLUCAP 190* 153* 126* 98 120*   Lipid Profile: No results for input(s): CHOL, HDL, LDLCALC, TRIG, CHOLHDL, LDLDIRECT in the last 72 hours. Thyroid Function Tests: No results for input(s): TSH, T4TOTAL, FREET4, T3FREE, THYROIDAB in the last 72 hours. Anemia Panel: No results for input(s): VITAMINB12, FOLATE, FERRITIN, TIBC, IRON, RETICCTPCT in the last 72 hours. Sepsis Labs: No results for input(s): PROCALCITON, LATICACIDVEN in the last 168 hours.  Recent Results (from the past 240 hour(s))  MRSA PCR Screening     Status: Abnormal   Collection Time: 09/20/17 12:10 AM  Result Value Ref Range Status   MRSA by PCR POSITIVE (A) NEGATIVE Final    Comment:        The GeneXpert MRSA Assay (FDA approved for NASAL specimens only), is one component of a comprehensive MRSA colonization surveillance program. It is not intended to diagnose MRSA infection nor to guide or monitor treatment for MRSA infections. RESULT CALLED TO, READ BACK BY AND VERIFIED WITH: WILLIS,S RN [id number] AT 0324 SKEEN,P   Culture, blood (Routine X 2) w Reflex to ID Panel     Status: None   Collection Time: 09/20/17  7:57 AM  Result Value Ref Range Status   Specimen Description BLOOD LEFT ANTECUBITAL  Final   Special Requests   Final    BOTTLES DRAWN AEROBIC ONLY Blood Culture adequate volume   Culture NO GROWTH 5 DAYS  Final   Report Status 09/25/2017 FINAL  Final  Culture, blood (Routine X 2) w Reflex to ID Panel     Status: None   Collection Time: 09/20/17  8:03 AM  Result Value Ref Range Status   Specimen Description BLOOD BLOOD LEFT  HAND  Final   Special Requests   Final    BOTTLES DRAWN AEROBIC ONLY Blood Culture adequate volume   Culture NO GROWTH 5 DAYS  Final   Report Status 09/25/2017 FINAL  Final  Urine Culture     Status: None   Collection Time: 09/20/17 10:32 AM  Result Value Ref  Range Status   Specimen Description URINE, CATHETERIZED  Final   Special Requests NONE  Final   Culture NO GROWTH  Final   Report Status 09/21/2017 FINAL  Final     Radiology Studies: No results found.  Scheduled Meds: . furosemide  80 mg Intravenous Daily  . glycopyrrolate  0.2 mg Intravenous Q6H  . white petrolatum       Continuous Infusions: . HYDROmorphone 1 mg/hr (09/28/17 1411)     LOS: 10 days    Time spent: > 35 minutes  Penny Pia, MD [facility] Pager [phone]  If 7PM-7AM, please contact night-coverage [email or web address] Password [email or web address] 09/28/2017, 5:50 PM

## 2017-09-28 NOTE — Progress Notes (Signed)
Plans for comfort care noted per palliative care service. Cardiology will sign-off. Please call with questions.  Chrystie Nose, MD, [facility]  [place]  [facility]  Attending Cardiologist  Direct Dial: [phone]  Fax: [phone]  Website:  [email or web address]

## 2017-09-28 NOTE — Progress Notes (Signed)
Orthopaedic Trauma Progress Note  S: Made comfort care over weekend. Brother and sister in law at bedside  O:  Vitals:   09/27/17 2056 09/28/17 0455  BP:  (!) 157/76  Pulse: 94 92  Resp: 17 17  Temp: 98.8 F (37.1 C) 98.2 F (36.8 C)  SpO2: 94% 97%  RLE: No exam performed this AM  A/P: POD9 s/p right hip hemi  -Palliative care currently -No orthopaedic restrictions   Roby Lofts, MD [facility] [phone] (phone)

## 2017-09-28 NOTE — Progress Notes (Signed)
Palliative Medicine RN Note: Symptom check. Pt got ativan about 45 minutes before I saw him. He is breathing heavy with furrowing of eyebrows, and when asked about whether he's hurting, he replied "yes." He is unable to say where or how bad. Family at bedside. Discussed s/s pain and distress. RN Clayborne Dana will give bolus dose of dilaudid (this is the first time he's needed this). Plan for f/u this afternoon.  Margret Chance Banyan Goodchild, RN, BSN, [facility] 09/28/2017 11:48 AM Cell [phone] 8:00-4:00 Monday-Friday Office [phone]

## 2017-09-28 DEATH — deceased

## 2017-09-29 MED ORDER — GLYCOPYRROLATE 0.2 MG/ML IJ SOLN
0.4000 mg | Freq: Four times a day (QID) | INTRAMUSCULAR | Status: DC
  Administered 2017-09-29 – 2017-09-30 (×4): 0.4 mg via INTRAVENOUS
  Filled 2017-09-29 (×5): qty 2

## 2017-09-29 NOTE — Progress Notes (Signed)
Palliative Medicine RN Note:  Called to bedside by RN. Family requesting bolus. She gave bolus, then I saw pt 30 min late. Pt is awake and reporting that he feels bad/hurts. Obtained rx to increase basal rate and will have RN give another bolus.  Margret Chance Dimitria Ketchum, RN, BSN, [facility] 09/29/2017 4:09 PM Cell [phone] 8:00-4:00 Monday-Friday Office [phone]

## 2017-09-29 NOTE — Progress Notes (Signed)
Check on this patient , patient is resting at this time, no signs of distress.

## 2017-09-29 NOTE — Progress Notes (Signed)
PROGRESS NOTE    Adam Weeks.  ZOX:[id number] DOB: [birth year] DOA: 10-03-2017 PCP: [name], MD    Brief Narrative:  83 YR OLD MALE PRESENTED ON 10/03/17 S/P FALL, a displaced right femoral neck fracture,  Right hip hemiarthroplasty. Noted to Afib RVR received Metoprolol 2.5mg  bradycardic s/p PEA arrest 9/22.  Palliative care team consulted at patient and family's request. Patient is currently undergoing comfort care measures. Son reports they're interested in residential hospice  Assessment & Plan:   Principal Problem:   Closed right hip fracture, initial encounter [facility]) - Palliative care team consulted plan is for comfort care.  AKI - nephrology on board and managing. Worsening.  - comfort care per palliative team. Pt does not wish for dialysis  Active Problems:   Current chronic use of systemic steroids -  Comfort care    HTN (hypertension) - comfort care measures    Syncope and collapse   Bradycardia - resolved    Cardiac arrest (HCC)   S/P CABG (coronary artery bypass graft) - continue comfort care measures    Acute respiratory failure (HCC) - resolved, patient extubated. - supplemental oxygen as needed.     Paroxysmal atrial fibrillation (HCC) - comfort care measures   DVT prophylaxis: pt in comfort care measures Code Status: Partial Family Communication: d/c daughter in law Disposition Plan: Transition to residential hospice   Consultants:   Nephrology  Cardiology  Orthopedic  Neurology  Procedures: POD6 s/p right hip hemi   Antimicrobials: None   Subjective: No new complaints reported.  Objective: Vitals:   09/28/17 0455 09/28/17 1429 09/28/17 2229 09/29/17 0459  BP: (!) 157/76 (!) 148/71 (!) 158/76 (!) 152/70  Pulse: 92 94 (!) 103 (!) 103  Resp: Temp: 98.2 F (36.8 C) 98.5 F (36.9 C) 98.7 F (37.1 C) 97.7 F (36.5 C)  TempSrc: Oral Oral Oral Oral  SpO2: 97% 98% 95% 93%  Weight:      Height:         Intake/Output Summary (Last 24 hours) at 09/29/17 2105 Last data filed at 09/29/17 1900  Gross per 24 hour  Intake              150 ml  Output              900 ml  Net             -750 ml   Filed Weights   09/09/2017 0457 09/23/17 0500 09/23/17 1000  Weight: 77 kg (169 lb 12.1 oz) 86.1 kg (189 lb 13.1 oz) 86.1 kg (189 lb 13.1 oz)    Examination:Exam unchanged when compared to 09/28/2017  General exam: Appears calm and comfortable, resting. Respiratory system: Clear to auscultation. Respiratory effort normal. Equal chest rise. Cardiovascular system: S1 & S2 heard,  No JVD, murmurs, rubs Gastrointestinal system: Abdomen is nondistended, soft and nontender.  Central nervous system:No focal neurological deficits. Extremities: warm + pulses Skin: No rashes, lesions or ulcers, on limited exam. Psychiatry:  Pt resting unable to assess    Data Reviewed: I have personally reviewed following labs and imaging studies  CBC:  Recent Labs Lab 09/23/17 0548 09/24/17 0335 09/25/17 0409 09/26/17 0343 09/27/17 0413  WBC 11.5* 12.0* 11.7* 12.3* 13.0*  HGB 7.6* 8.2* 8.8* 8.8* 8.9*  HCT 22.9* 24.4* 26.6* 26.8* 26.4*  MCV 96.2 93.8 94.0 94.0 94.0  PLT 129* 129* 196 240 295   Basic Metabolic Panel:  Recent Labs Lab 09/23/17 0548  09/24/17 0335 09/26/17 0343 09/27/17 0413  NA 136 133* 130* 132*  K 4.7 4.4 4.7 4.6  CL 100* 98* 98* 99*  CO2 19* 19* 19* 20*  GLUCOSE 148* 120* 109* 132*  BUN 104* 112* 133* 133*  CREATININE 5.68* 5.72* 6.19* 6.18*  CALCIUM 7.2* 7.7* 8.6* 8.7*  PHOS  --  4.7*  --   --    GFR: Estimated Creatinine Clearance: 9.1 mL/min (A) (by C-G formula based on SCr of 6.18 mg/dL (H)). Liver Function Tests:  Recent Labs Lab 09/24/17 0335  ALBUMIN 2.1*   No results for input(s): LIPASE, AMYLASE in the last 168 hours. No results for input(s): AMMONIA in the last 168 hours. Coagulation Profile: No results for input(s): INR, PROTIME in the last 168  hours. Cardiac Enzymes: No results for input(s): CKTOTAL, CKMB, CKMBINDEX, TROPONINI in the last 168 hours. BNP (last 3 results)  Recent Labs  04/13/17 1552  PROBNP 399.0*   HbA1C: No results for input(s): HGBA1C in the last 72 hours. CBG:  Recent Labs Lab 09/26/17 2043 09/26/17 2340 09/27/17 0428 09/27/17 0734 09/27/17 1138  GLUCAP 190* 153* 126* 98 120*   Lipid Profile: No results for input(s): CHOL, HDL, LDLCALC, TRIG, CHOLHDL, LDLDIRECT in the last 72 hours. Thyroid Function Tests: No results for input(s): TSH, T4TOTAL, FREET4, T3FREE, THYROIDAB in the last 72 hours. Anemia Panel: No results for input(s): VITAMINB12, FOLATE, FERRITIN, TIBC, IRON, RETICCTPCT in the last 72 hours. Sepsis Labs: No results for input(s): PROCALCITON, LATICACIDVEN in the last 168 hours.  Recent Results (from the past 240 hour(s))  MRSA PCR Screening     Status: Abnormal   Collection Time: 09/20/17 12:10 AM  Result Value Ref Range Status   MRSA by PCR POSITIVE (A) NEGATIVE Final    Comment:        The GeneXpert MRSA Assay (FDA approved for NASAL specimens only), is one component of a comprehensive MRSA colonization surveillance program. It is not intended to diagnose MRSA infection nor to guide or monitor treatment for MRSA infections. RESULT CALLED TO, READ BACK BY AND VERIFIED WITH: WILLIS,S RN [phone] AT 0324 SKEEN,P   Culture, blood (Routine X 2) w Reflex to ID Panel     Status: None   Collection Time: 09/20/17  7:57 AM  Result Value Ref Range Status   Specimen Description BLOOD LEFT ANTECUBITAL  Final   Special Requests   Final    BOTTLES DRAWN AEROBIC ONLY Blood Culture adequate volume   Culture NO GROWTH 5 DAYS  Final   Report Status 09/25/2017 FINAL  Final  Culture, blood (Routine X 2) w Reflex to ID Panel     Status: None   Collection Time: 09/20/17  8:03 AM  Result Value Ref Range Status   Specimen Description BLOOD BLOOD LEFT HAND  Final   Special Requests   Final     BOTTLES DRAWN AEROBIC ONLY Blood Culture adequate volume   Culture NO GROWTH 5 DAYS  Final   Report Status 09/25/2017 FINAL  Final  Urine Culture     Status: None   Collection Time: 09/20/17 10:32 AM  Result Value Ref Range Status   Specimen Description URINE, CATHETERIZED  Final   Special Requests NONE  Final   Culture NO GROWTH  Final   Report Status 09/21/2017 FINAL  Final     Radiology Studies: No results found.  Scheduled Meds: . furosemide  80 mg Intravenous Daily  . glycopyrrolate  0.4 mg Intravenous Q6H  Continuous Infusions: . HYDROmorphone 2 mg/hr (09/29/17 1615)     LOS: 11 days    Time spent: > 35 minutes  Penny Pia, MD [facility] Pager [phone]  If 7PM-7AM, please contact night-coverage [email or web address] Password [email or web address] 09/29/2017, 9:05 PM

## 2017-09-29 NOTE — Progress Notes (Signed)
Nutrition Brief Note  Chart reviewed. Pt now transitioning to comfort care.  No further nutrition interventions warranted at this time.  Please re-consult as needed.   Shakena Callari A. Majour Frei, RD, LDN, CDE Pager: [phone] After hours Pager: [phone]  

## 2017-09-29 NOTE — Progress Notes (Addendum)
Palliative Medicine RN Note: Pt is awake but unable to answer questions about pain. Per family at bedside, he is newly confused and rambling on about bicycles.  Met with son [name]. Discussed inpt hospice. He absolutely wants Mr Magallanes to go to a hospice facility, but he is unsure which one. Currently, [employer] [facility] will not continue IV or SQ infusions, and Mr Kuwahara needs continuous INTRAVENOUS dilaudid with prn pushes for chest pain; this cannot be adequately addressed by PO/SL oxycodone per Dr Hilma Favors (pt is in renal failure, so morphine is contraindicated).   Plan for son to get back to me this afternoon with decision re: choice of facility. We discussed [facility] vs [facility] r/t ability to get continuous infusions. These are the only facilities the family will agree to d/t distance.  I will follow up on symptoms tomorrow.  Marjie Skiff Seraphim Trow, RN, BSN, [facility] 09/29/2017 2:18 PM Cell [phone] 8:00-4:00 Monday-Friday Office [phone]  1518: Spoke with Harmon Pier from [facility]. They can accommodate IV pain medication, so referral was placed at family request. Plan f/u tomorrow am.   Marjie Skiff. Michelena Culmer, RN, BSN, [facility] 09/29/2017 3:19 PM Cell [phone] 8:00-4:00 Monday-Friday Office [phone]

## 2017-09-29 NOTE — Progress Notes (Signed)
Palliative Medicine RN Note: Rec'd call from family that pt had a "bad night" and is having increased secretions. Saw pt with daughter in law in the room. He is able to say that he is hurting and that his chest is tight. He does have some audible secretions today. Checked IV pump and MAR; pt was still on 0.5 mg/hour and only got one bolus dose overnight. RN adjusted pump to ordered rate and gave a bolus dose. Obtained rx to schedule robinul. PMT will follow up this afternoon to ensure comfort.  Margret Chance Zelta Enfield, RN, BSN, [facility] 09/29/2017 10:22 AM Cell [phone] 8:00-4:00 Monday-Friday Office [phone]

## 2017-09-29 NOTE — Progress Notes (Signed)
CSW following for discharge planning. CSW alerted by MD that patient could be appropriate for residential hospice. CSW noting PMT RN note where facility choice has been presented to the patient's son, but there is difficulty in locating an appropriate facility due to the patient needing continuous dilaudid infusion and not every facility can accommodate that need for the patient.  CSW will continue to follow to determine PMT recommendations as well as available options for the patient.  Blenda Nicely, [place] Clinical Social Worker [phone]

## 2017-09-29 NOTE — Progress Notes (Signed)
Charge RN stated palliative nurse called to give bolus to the patient, patient was resting well after my last dose of bolus given at 1616, check patient after increasing dose to /hr. Family art bedside and stating that patient is resting well at this time. Will continue to monitor.

## 2017-09-29 NOTE — Progress Notes (Signed)
Patient resting at this time, no signs of discomfort and distress.

## 2017-09-29 NOTE — Progress Notes (Signed)
Repositioned pt to his left side, pt moaning. PRN ativan administered for comfort. Suctioned secretions orally to clear throat.

## 2017-09-29 NOTE — Consult Note (Signed)
           [facility] CM Primary Care Navigator  09/29/2017  Adam Weeks. 1927/11/16 [id number]   [name] to see patient at the bedside to identify possible discharge needs. Patient is calm and comfortable with eyes closed.  Per MD note, patient had a fall with right hip fracture, undergone surgery and was admitted to ICU, extubated on 9/26. Patient's renal function has continued to decline and he does not wish for dialysis.  Palliative care team consulted with current plan of transitioning patient to comfort care. RN reports that anticipated plan is for residential hospice.  No further health management neededat this point.   For questions, please contact:  Wyatt Haste, BSN, RN- [facility] Primary Care Navigator  Telephone: [phone] [facility]

## 2017-10-29 NOTE — Progress Notes (Addendum)
Progress Note  I was paged by patient's RN this morning stating that patient expired at 6:40 am on Sep 30, 2017. She wanted to know if this was a ME case and I advised her to contact the ME's office to clarify. She informed me that ME has accepted the case and death certificate to be completed by the ME.  I did not take care of this patient and patient demised prior to my return to work today.  The death summary to be completed by Dr. Cena Benton, prior rounding hospitalist colleague who attended to patient from 09/25/17-09/29/2017.  Adam Scott, MD, FACP, FHM. [facility] Pager [phone]  If 7PM-7AM, please contact night-coverage [email or web address] Password [email or web address] 10/19/2017, 9:14 AM

## 2017-10-29 NOTE — Progress Notes (Signed)
Wasted 176 ml of Dilaudid IV with Nurse Hulan Saas. Wasted in the sink.

## 2017-10-29 NOTE — Discharge Summary (Signed)
Death Summary  [name]. EAV:[id number] DOB: [birth year] DOA: 09/24/2017  PCP: [name], MD  Admit date: 09/24/17 Date of Death: 2017/10/13  Final Diagnoses:  Principal Problem:   Closed right hip fracture, initial encounter (HCC) Active Problems:   TIA (transient ischemic attack)   Current chronic use of systemic steroids   HTN (hypertension)   Syncope and collapse   Aphasia   Bradycardia   Cardiac arrest (HCC)   S/P CABG (coronary artery bypass graft)   Acute respiratory failure (HCC)   Paroxysmal atrial fibrillation (HCC)   Palliative care by specialist   Pain   History of present illness:  83 YR OLD MALE PRESENTED ON Sep 24, 2017 S/P FALL, a displaced right femoral neck fracture, Right hip hemiarthroplasty. Noted to Afib RVR received Metoprolol 2.5mg  bradycardic s/p PEA arrest 9/22.  Palliative care team consulted at patient and family's request. Patient is currently undergoing comfort care measures. Son reports they're interested in residential hospice  Hospital Course:   Principal Problem:   Closed right hip fracture, initial encounter [facility]) - Palliative care team consulted plan was for comfort care.  AKI - nephrology on board and assisted with management. Worsened.  - comfort care per palliative team. Pt did not wish for dialysis  Active Problems:   Current chronic use of systemic steroids -  Comfort care    HTN (hypertension) - comfort care measures    Syncope and collapse   Bradycardia   Cardiac arrest (HCC)   S/P CABG (coronary artery bypass graft) - continue comfort care measures    Acute respiratory failure (HCC) - resolved, patient extubated. - supplemental oxygen as needed. Once patient respiratory condition improved he stated he wanted comfort care measures, discussed with son at bedside who was in agreement as well.     Paroxysmal atrial fibrillation (HCC) - comfort care measures  Signed:  Penny Pia  [facility] 10-13-2017, 8:42 AM

## 2017-10-29 NOTE — Progress Notes (Signed)
Pt expired at this hour. No pulse and no breathing verified with another Charity fundraiser. Pronounced expired at this time.

## 2017-10-29 NOTE — Progress Notes (Addendum)
Body transported to the morgue by the staff at 1020.

## 2017-10-29 DEATH — deceased

## 2017-11-02 ENCOUNTER — Ambulatory Visit: Payer: Medicare Other | Admitting: Internal Medicine

## 2018-12-09 IMAGING — CT CT ANGIO NECK
2 of 7 series · 8 of 33 positions shown · IV contrast (APPLIED)
Comparison: Prior CT from earlier same day.

CLINICAL DATA: Initial evaluation for acute aphasia.

EXAM:
CT ANGIOGRAPHY HEAD AND NECK
TECHNIQUE: Multidetector CT imaging of the head and neck was performed using
the standard protocol during bolus administration of intravenous
contrast. Multiplanar CT image reconstructions and MIPs were
obtained to evaluate the vascular anatomy. Carotid stenosis
measurements (when applicable) are obtained utilizing NASCET
criteria, using the distal internal carotid diameter as the
denominator.
CONTRAST:  50 cc of Isovue 370.

[Series 7: cta neck/head · axial · 0.48mm/px · z∈[-278,-162]mm · 2 of 174 slices shown]
[im 58/174  soft-tissue]
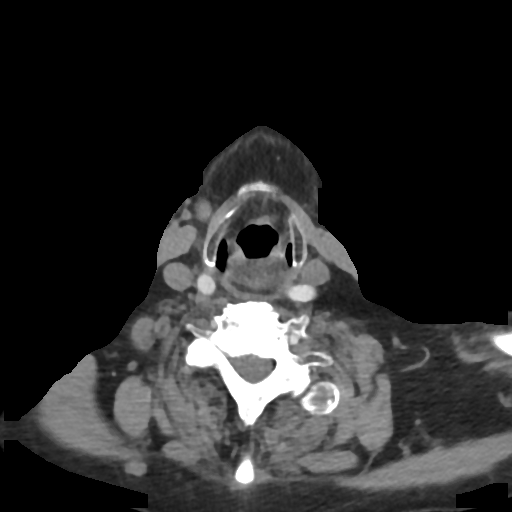
[im 116/174  soft-tissue]
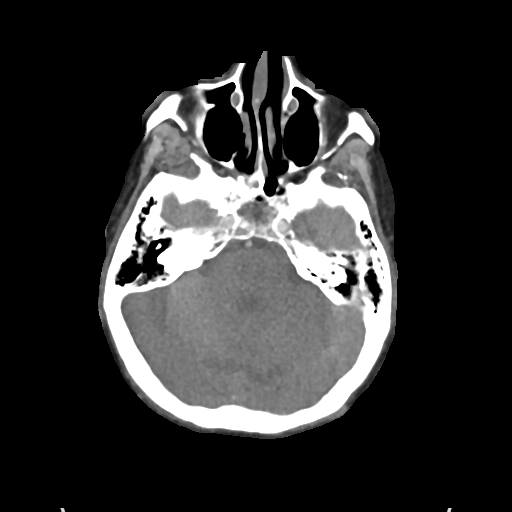

[Series 9: ax thins · axial · 0.39mm/px · z∈[-341,-94]mm · 6 of 344 slices shown]
[im 50/344  soft-tissue]
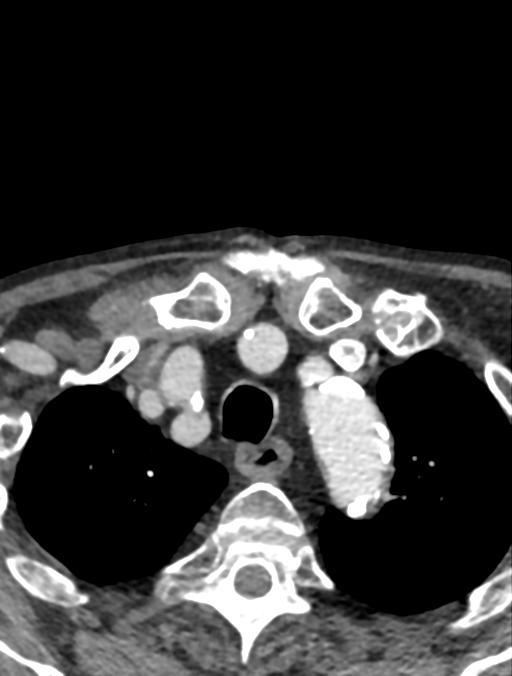
[im 99/344  bone]
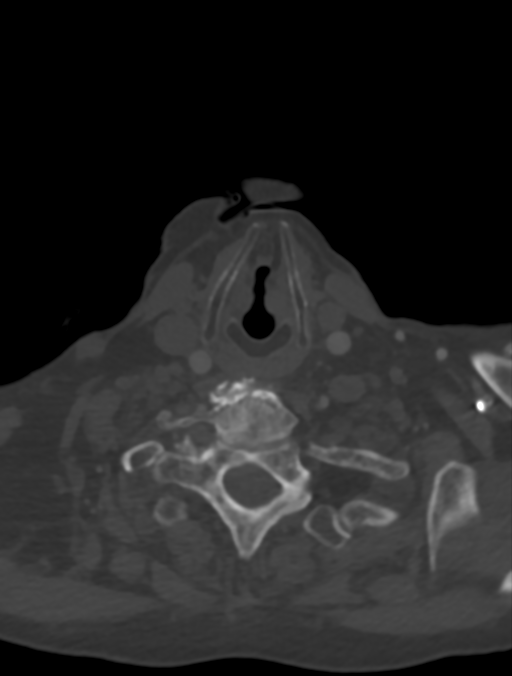
[im 148/344  soft-tissue]
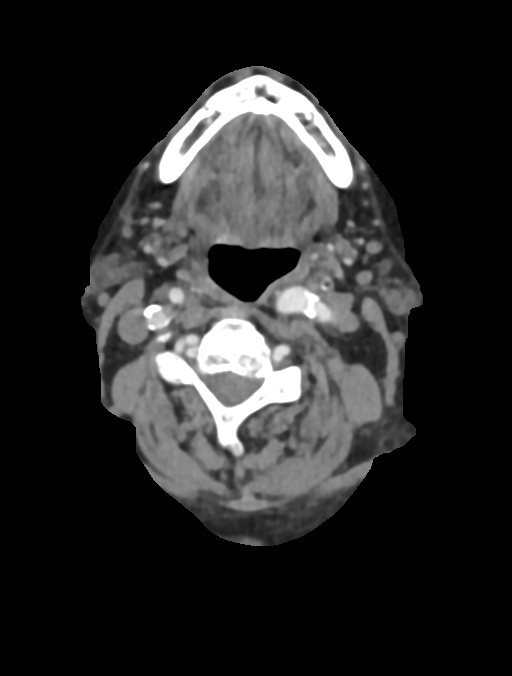
[im 197/344  bone]
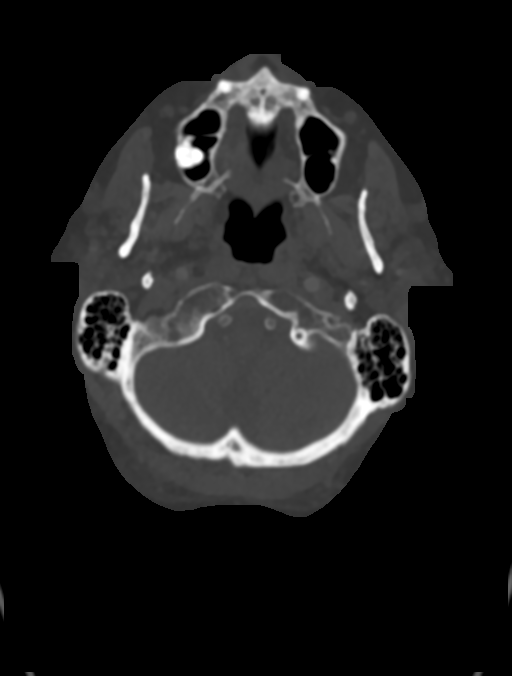
[im 246/344  soft-tissue]
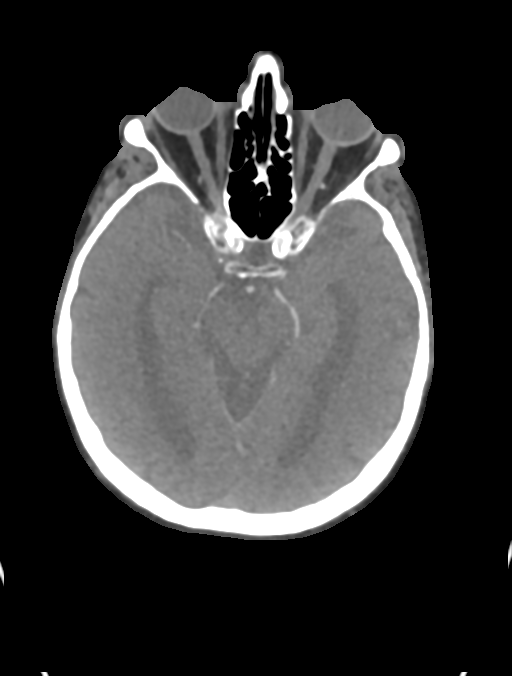
[im 295/344  bone]
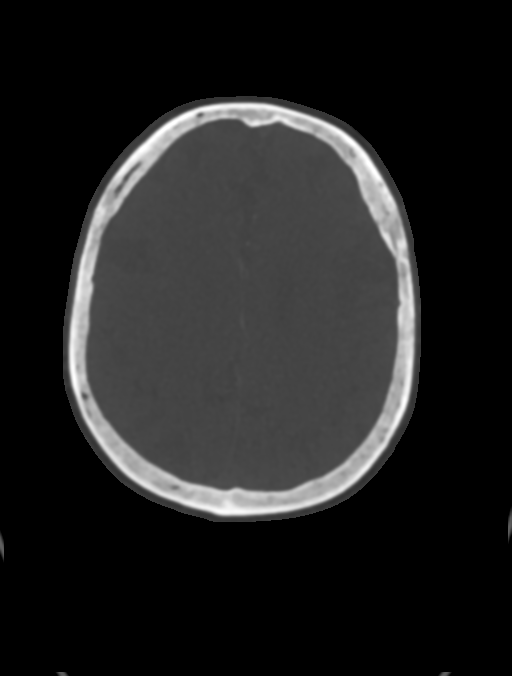

[8 of 33 positions shown; findings below may reference images not displayed]

FINDINGS: CTA NECK FINDINGS

Aortic arch: Visualized aortic arch of normal caliber with with
normal branch pattern. Scattered atheromatous plaque present about
the arch itself as well as the origin of the great vessels without
flow-limiting stenosis. Visualized subclavian artery is widely
patent.

Right carotid system: Right common carotid artery patent from its
origin to the bifurcation. Calcified noncalcified plaque about the
right bifurcation/proximal right ICA. Associated stenosis of up to
70% by NASCET criteria. Right ICA patent distally to the skullbase
without stenosis.

Left carotid system: Left common carotid artery tortuous proximally
but widely patent to the bifurcation. Calcified plaque about the
left bifurcation without flow-limiting stenosis. Left ICA patent
distally to the skullbase without stenosis or occlusion.

Vertebral arteries: Both vertebral arteries arise from the
subclavian arteries. Vertebral artery's tortuous with scattered
atheromatous irregularity without high-grade flow-limiting stenosis.
Vertebral arteries are patent to the skullbase.

Skeleton: No acute osseus abnormality. No worrisome lytic or blastic
osseous lesions. Advanced degenerative spondylolysis throughout the
cervical spine. Grade 1 anterolisthesis of C2 on C3 and C7 on T1.

Other neck: No acute soft tissue abnormality within the neck.
Salivary glands demonstrate no acute abnormality. Thyroid within
normal limits. No adenopathy.

Upper chest: Circumferential wall thickening about the visualized
upper esophagus. Visualized upper mediastinum otherwise within
normal limits. Partially visualized lungs are clear.

Review of the MIP images confirms the above findings

CTA HEAD FINDINGS

Anterior circulation: Petrous segments widely patent bilaterally.
Smooth atheromatous plaque throughout the cavernous/ supraclinoid
ICAs with moderate diffuse narrowing. Changes most notable at the
supraclinoid right ICA. ICA termini patent bilaterally. A1 segments
irregular but patent without stenosis. Patent anterior communicating
artery. Multifocal atheromatous irregularity within the A2 segments
bilaterally, left greater than right, without high-grade stenosis.
ACAs are patent to their distal aspects.

Right M1 segment somewhat irregular but patent to its distal aspect
without flow-limiting stenosis. Right MCA bifurcation normal.
Proximal M2 stenoses and irregularity without occlusion. MCA
branches demonstrate extensive atheromatous irregularity but are
perfused to their distal aspects.

Left M1 segment patent without stenosis or occlusion. Proximal and 2
stenoses and irregularity without occlusion. Distal left MCA
branches are extensively irregular but patent to their distal
aspects.

Posterior circulation: Multifocal atheromatous plaque within the
bilateral V4 segments with mild to moderate multifocal narrowing.
Left vertebral artery dominant. Posterior inferior cerebral arteries
are patent proximally. Basilar artery irregular but patent to its
distal aspect without high-grade stenosis. Superior cerebral
arteries patent bilaterally. Right PCA supplied via the basilar and
is widely patent to its distal aspect with moderate atheromatous
irregularity. Fetal type origin of the left PCA supplied via a
patent left posterior communicating artery. Left PCA also patent to
its distal aspect without flow-limiting stenosis with moderate
multifocal atheromatous irregularity.

Venous sinuses: Not well evaluated due to arterial timing of the
contrast bolus.

Anatomic variants: Fetal type origin of the left PCA. No other
significant.

Delayed phase: Not performed.

Review of the MIP images confirms the above findings
IMPRESSION: 1. Negative CTA for emergent large vessel occlusion.
2. Atheromatous plaque at the right carotid bifurcation with
associated stenosis of up to 70% by NASCET criteria.
3. Additional extensive atheromatous disease involving the medium
and small vessels of the head and neck as above. No other high-grade
or correctable stenosis identified.
4. Diffuse esophageal wall thickening about the visualized upper
esophagus. Considerations include sequelae of reflux disease or
possibly acute esophagitis.

Critical Value/emergent results were called by telephone at the time
of interpretation on 09/18/2017 at [DATE] to Dr. FRANKO DUMOND ,
who verbally acknowledged these results.

## 2018-12-09 IMAGING — DX DG CHEST 1V
1 series · 1 of 1 positions shown · non-contrast
Comparison: 04/13/2017

CLINICAL DATA: Fall with pain.

EXAM:
CHEST 1 VIEW

[t chest supine]
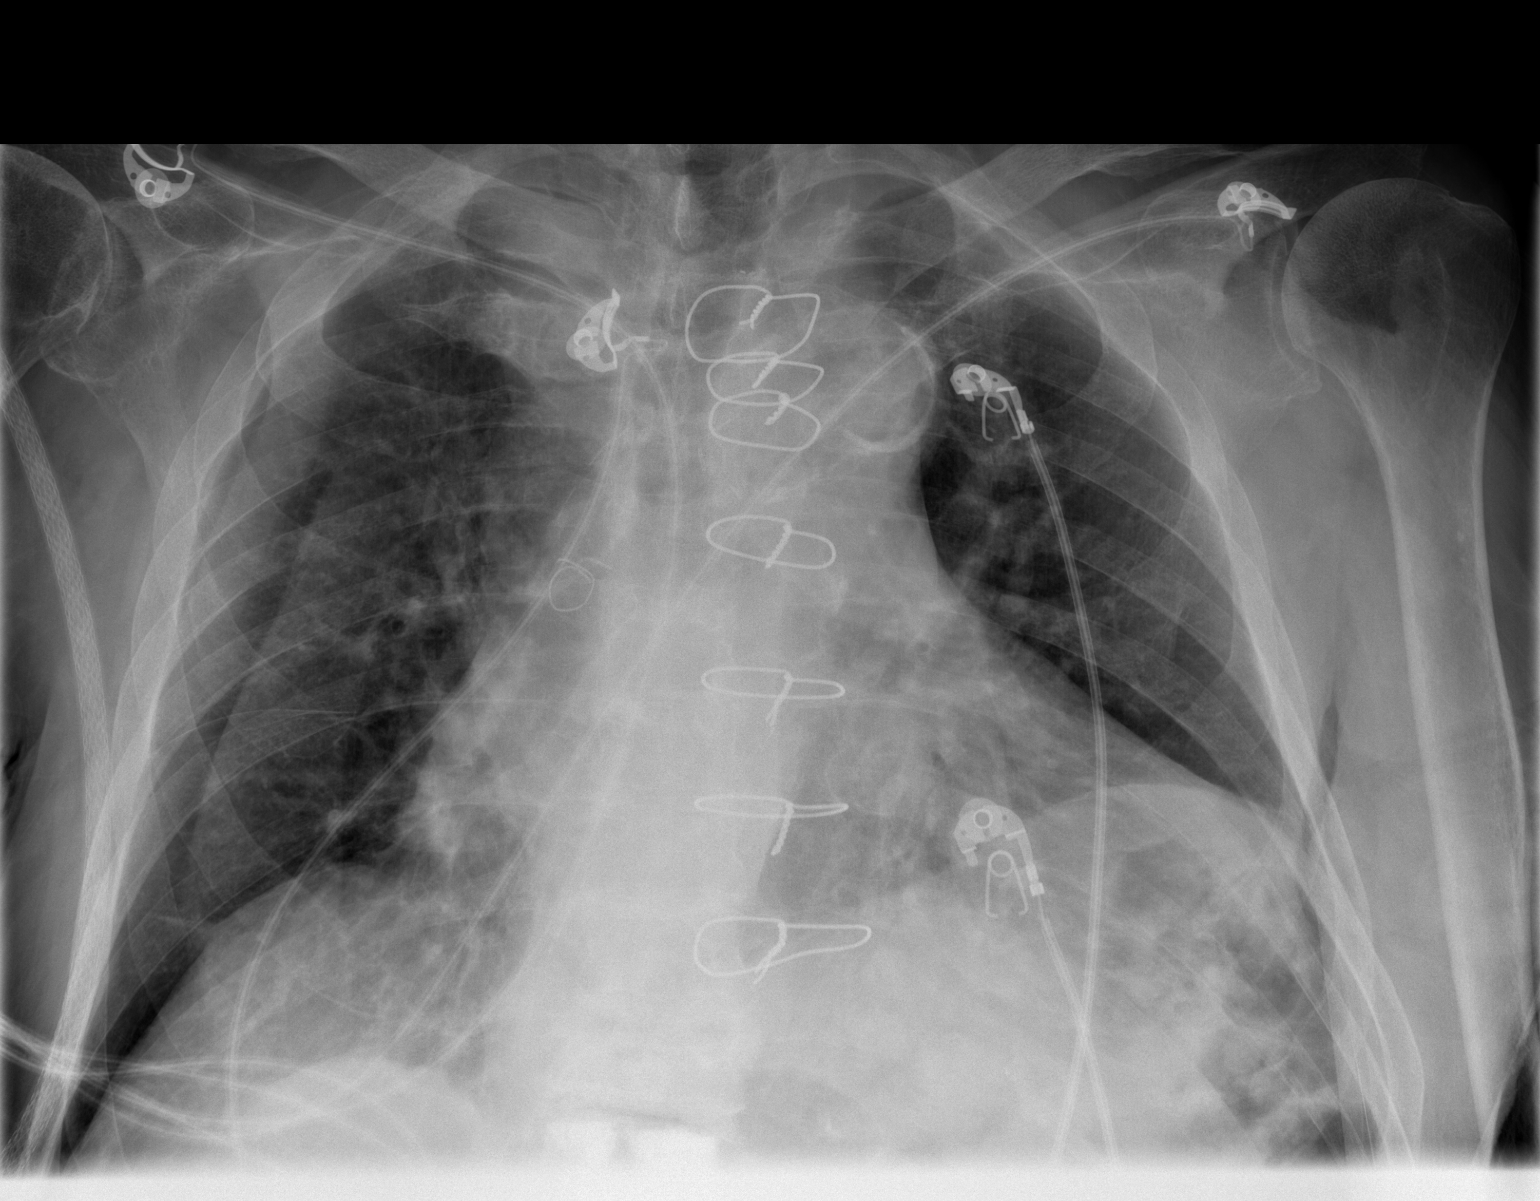

[1 of 1 positions shown; findings below may reference images not displayed]

FINDINGS: Lungs are adequately inflated with mild elevation of the left
hemidiaphragm unchanged. No focal lobar consolidation or effusion.
No pneumothorax. Mild stable cardiomegaly. Calcified plaque over the
aortic arch. Remainder of the exam is unchanged.
IMPRESSION: No acute cardiopulmonary disease.

Stable cardiomegaly.

Aortic Atherosclerosis ([id number]).
